# Patient Record
Sex: Female | Born: 1960 | Race: White | Hispanic: No | Marital: Married | Smoking: Never smoker
Health system: Southern US, Community
[De-identification: ages and names within clinical notes are randomized; demographics above are authoritative.]

## PROBLEM LIST (undated history)

## (undated) DIAGNOSIS — C801 Malignant (primary) neoplasm, unspecified: Secondary | ICD-10-CM

## (undated) DIAGNOSIS — Z923 Personal history of irradiation: Secondary | ICD-10-CM

## (undated) DIAGNOSIS — K449 Diaphragmatic hernia without obstruction or gangrene: Secondary | ICD-10-CM

## (undated) DIAGNOSIS — Z86010 Personal history of colonic polyps: Secondary | ICD-10-CM

## (undated) DIAGNOSIS — E785 Hyperlipidemia, unspecified: Secondary | ICD-10-CM

## (undated) DIAGNOSIS — K635 Polyp of colon: Secondary | ICD-10-CM

## (undated) DIAGNOSIS — K219 Gastro-esophageal reflux disease without esophagitis: Secondary | ICD-10-CM

## (undated) DIAGNOSIS — M810 Age-related osteoporosis without current pathological fracture: Secondary | ICD-10-CM

## (undated) DIAGNOSIS — K648 Other hemorrhoids: Secondary | ICD-10-CM

## (undated) DIAGNOSIS — L989 Disorder of the skin and subcutaneous tissue, unspecified: Secondary | ICD-10-CM

## (undated) DIAGNOSIS — K644 Residual hemorrhoidal skin tags: Secondary | ICD-10-CM

## (undated) HISTORY — DX: Gastro-esophageal reflux disease without esophagitis: K21.9

## (undated) HISTORY — PX: COLONOSCOPY: SHX174

## (undated) HISTORY — DX: Residual hemorrhoidal skin tags: K64.4

## (undated) HISTORY — DX: Age-related osteoporosis without current pathological fracture: M81.0

## (undated) HISTORY — DX: Disorder of the skin and subcutaneous tissue, unspecified: L98.9

## (undated) HISTORY — DX: Personal history of irradiation: Z92.3

## (undated) HISTORY — PX: RHINOPLASTY: SUR1284

## (undated) HISTORY — DX: Other hemorrhoids: K64.8

## (undated) HISTORY — DX: Diaphragmatic hernia without obstruction or gangrene: K44.9

## (undated) HISTORY — DX: Hyperlipidemia, unspecified: E78.5

## (undated) HISTORY — DX: Personal history of colonic polyps: Z86.010

## (undated) HISTORY — DX: Polyp of colon: K63.5

---

## 2001-01-17 ENCOUNTER — Other Ambulatory Visit: Admission: RE | Admit: 2001-01-17 | Discharge: 2001-01-17 | Payer: Self-pay | Admitting: *Deleted

## 2004-04-09 ENCOUNTER — Other Ambulatory Visit: Admission: RE | Admit: 2004-04-09 | Discharge: 2004-04-09 | Payer: Self-pay | Admitting: *Deleted

## 2005-09-23 ENCOUNTER — Other Ambulatory Visit: Admission: RE | Admit: 2005-09-23 | Discharge: 2005-09-23 | Payer: Self-pay | Admitting: *Deleted

## 2006-10-20 ENCOUNTER — Other Ambulatory Visit: Admission: RE | Admit: 2006-10-20 | Discharge: 2006-10-20 | Payer: Self-pay | Admitting: *Deleted

## 2007-10-26 ENCOUNTER — Other Ambulatory Visit: Admission: RE | Admit: 2007-10-26 | Discharge: 2007-10-26 | Payer: Self-pay | Admitting: Family Medicine

## 2007-12-06 LAB — CONVERTED CEMR LAB: Pap Smear: NORMAL

## 2008-11-12 ENCOUNTER — Other Ambulatory Visit: Admission: RE | Admit: 2008-11-12 | Discharge: 2008-11-12 | Payer: Self-pay | Admitting: Family Medicine

## 2009-06-15 ENCOUNTER — Encounter: Payer: Self-pay | Admitting: Family Medicine

## 2009-06-15 HISTORY — PX: COLONOSCOPY W/ BIOPSIES: SHX1374

## 2010-07-06 ENCOUNTER — Ambulatory Visit: Payer: Self-pay | Admitting: Family Medicine

## 2010-07-06 DIAGNOSIS — N951 Menopausal and female climacteric states: Secondary | ICD-10-CM

## 2010-07-08 LAB — CONVERTED CEMR LAB
ALT: 18 units/L (ref 0–35)
AST: 20 units/L (ref 0–37)
Albumin: 4.1 g/dL (ref 3.5–5.2)
Alkaline Phosphatase: 77 units/L (ref 39–117)
BUN: 14 mg/dL (ref 6–23)
Basophils Absolute: 0 10*3/uL (ref 0.0–0.1)
Basophils Relative: 0.4 % (ref 0.0–3.0)
Bilirubin, Direct: 0.1 mg/dL (ref 0.0–0.3)
CO2: 29 meq/L (ref 19–32)
Calcium: 10 mg/dL (ref 8.4–10.5)
Chloride: 109 meq/L (ref 96–112)
Cholesterol: 253 mg/dL — ABNORMAL HIGH (ref 0–200)
Creatinine, Ser: 0.8 mg/dL (ref 0.4–1.2)
Direct LDL: 164.2 mg/dL
Eosinophils Absolute: 0.2 10*3/uL (ref 0.0–0.7)
Eosinophils Relative: 1.6 % (ref 0.0–5.0)
GFR calc non Af Amer: 85.09 mL/min (ref >60)
Glucose, Bld: 84 mg/dL (ref 70–99)
HCT: 38.4 % (ref 36.0–46.0)
HDL: 38.4 mg/dL — ABNORMAL LOW (ref >39.00)
Hemoglobin: 13 g/dL (ref 12.0–15.0)
Lymphocytes Relative: 22.9 % (ref 12.0–46.0)
Lymphs Abs: 2.4 10*3/uL (ref 0.7–4.0)
MCHC: 33.8 g/dL (ref 30.0–36.0)
MCV: 85.8 fL (ref 78.0–100.0)
Monocytes Absolute: 0.9 10*3/uL (ref 0.1–1.0)
Monocytes Relative: 8.3 % (ref 3.0–12.0)
Neutro Abs: 7 10*3/uL (ref 1.4–7.7)
Neutrophils Relative %: 66.8 % (ref 43.0–77.0)
Platelets: 340 10*3/uL (ref 150.0–400.0)
Potassium: 4.4 meq/L (ref 3.5–5.1)
RBC: 4.48 M/uL (ref 3.87–5.11)
RDW: 14 % (ref 11.5–14.6)
Sodium: 143 meq/L (ref 135–145)
TSH: 1.95 microintl units/mL (ref 0.35–5.50)
Total Bilirubin: 0.4 mg/dL (ref 0.3–1.2)
Total CHOL/HDL Ratio: 7
Total Protein: 7.2 g/dL (ref 6.0–8.3)
Triglycerides: 243 mg/dL — ABNORMAL HIGH (ref 0.0–149.0)
VLDL: 48.6 mg/dL — ABNORMAL HIGH (ref 0.0–40.0)
WBC: 10.4 10*3/uL (ref 4.5–10.5)

## 2010-07-09 ENCOUNTER — Encounter: Payer: Self-pay | Admitting: Family Medicine

## 2010-07-13 ENCOUNTER — Encounter: Payer: Self-pay | Admitting: Family Medicine

## 2010-09-17 ENCOUNTER — Ambulatory Visit: Payer: Self-pay | Admitting: Family Medicine

## 2010-09-19 LAB — CONVERTED CEMR LAB
ALT: 20 units/L (ref 0–35)
AST: 22 units/L (ref 0–37)
Albumin: 3.9 g/dL (ref 3.5–5.2)
Alkaline Phosphatase: 75 units/L (ref 39–117)
BUN: 13 mg/dL (ref 6–23)
Basophils Absolute: 0.1 10*3/uL (ref 0.0–0.1)
Basophils Relative: 0.6 % (ref 0.0–3.0)
Bilirubin, Direct: 0.1 mg/dL (ref 0.0–0.3)
CO2: 26 meq/L (ref 19–32)
Calcium: 9.6 mg/dL (ref 8.4–10.5)
Chloride: 107 meq/L (ref 96–112)
Cholesterol: 250 mg/dL — ABNORMAL HIGH (ref 0–200)
Creatinine, Ser: 0.8 mg/dL (ref 0.4–1.2)
Direct LDL: 174.9 mg/dL
Eosinophils Absolute: 0.2 10*3/uL (ref 0.0–0.7)
Eosinophils Relative: 1.8 % (ref 0.0–5.0)
GFR calc non Af Amer: 78.99 mL/min (ref >60)
Glucose, Bld: 88 mg/dL (ref 70–99)
HCT: 38.7 % (ref 36.0–46.0)
HDL: 39.2 mg/dL (ref >39.00)
Hemoglobin: 13.1 g/dL (ref 12.0–15.0)
Lymphocytes Relative: 22.6 % (ref 12.0–46.0)
Lymphs Abs: 2.2 10*3/uL (ref 0.7–4.0)
MCHC: 33.9 g/dL (ref 30.0–36.0)
MCV: 87.5 fL (ref 78.0–100.0)
Monocytes Absolute: 0.8 10*3/uL (ref 0.1–1.0)
Monocytes Relative: 8.7 % (ref 3.0–12.0)
Neutro Abs: 6.4 10*3/uL (ref 1.4–7.7)
Neutrophils Relative %: 66.3 % (ref 43.0–77.0)
Platelets: 327 10*3/uL (ref 150.0–400.0)
Potassium: 4.3 meq/L (ref 3.5–5.1)
RBC: 4.43 M/uL (ref 3.87–5.11)
RDW: 13.8 % (ref 11.5–14.6)
Sodium: 140 meq/L (ref 135–145)
TSH: 3.73 microintl units/mL (ref 0.35–5.50)
Total Bilirubin: 0.2 mg/dL — ABNORMAL LOW (ref 0.3–1.2)
Total CHOL/HDL Ratio: 6
Total Protein: 6.9 g/dL (ref 6.0–8.3)
Triglycerides: 199 mg/dL — ABNORMAL HIGH (ref 0.0–149.0)
VLDL: 39.8 mg/dL (ref 0.0–40.0)
WBC: 9.6 10*3/uL (ref 4.5–10.5)

## 2010-09-20 ENCOUNTER — Telehealth (INDEPENDENT_AMBULATORY_CARE_PROVIDER_SITE_OTHER): Payer: Self-pay | Admitting: *Deleted

## 2010-09-24 ENCOUNTER — Other Ambulatory Visit: Admission: RE | Admit: 2010-09-24 | Discharge: 2010-09-24 | Payer: Self-pay | Admitting: Family Medicine

## 2010-09-24 ENCOUNTER — Ambulatory Visit: Payer: Self-pay | Admitting: Family Medicine

## 2010-09-24 DIAGNOSIS — E785 Hyperlipidemia, unspecified: Secondary | ICD-10-CM

## 2010-09-24 LAB — CONVERTED CEMR LAB: Pap Smear: NORMAL

## 2010-09-24 LAB — HM PAP SMEAR

## 2010-10-04 ENCOUNTER — Encounter: Payer: Self-pay | Admitting: Family Medicine

## 2010-10-04 LAB — CONVERTED CEMR LAB: Pap Smear: NEGATIVE

## 2010-12-24 ENCOUNTER — Ambulatory Visit
Admission: RE | Admit: 2010-12-24 | Discharge: 2010-12-24 | Payer: Self-pay | Source: Home / Self Care | Attending: Family Medicine | Admitting: Family Medicine

## 2010-12-24 ENCOUNTER — Other Ambulatory Visit: Payer: Self-pay | Admitting: Family Medicine

## 2010-12-24 LAB — LDL CHOLESTEROL, DIRECT: Direct LDL: 172.4 mg/dL

## 2010-12-24 LAB — LIPID PANEL
Cholesterol: 227 mg/dL — ABNORMAL HIGH (ref 0–200)
HDL: 39.1 mg/dL (ref >39.00)
Total CHOL/HDL Ratio: 6
Triglycerides: 107 mg/dL (ref 0.0–149.0)
VLDL: 21.4 mg/dL (ref 0.0–40.0)

## 2010-12-24 LAB — AST: AST: 17 U/L (ref 0–37)

## 2010-12-24 LAB — ALT: ALT: 14 U/L (ref 0–35)

## 2010-12-31 ENCOUNTER — Ambulatory Visit
Admission: RE | Admit: 2010-12-31 | Discharge: 2010-12-31 | Payer: Self-pay | Source: Home / Self Care | Attending: Family Medicine | Admitting: Family Medicine

## 2010-12-31 DIAGNOSIS — M722 Plantar fascial fibromatosis: Secondary | ICD-10-CM | POA: Insufficient documentation

## 2011-01-04 NOTE — Assessment & Plan Note (Signed)
Summary: cpx gyn exam/rbh   Vital Signs:  Patient profile:   50 year old female Height:      65 inches Weight:      185.75 pounds BMI:     31.02 Temp:     98 degrees F oral Pulse rate:   76 / minute Pulse rhythm:   regular BP sitting:   112 / 82  (left arm) Cuff size:   regular  Vitals Entered By: Lewanda Rife LPN (September 24, 2010 9:51 AM) CC: CPX LMP 05/2008   History of Present Illness: here for health mt exam and to rev chronic med problems  has been feeling fine  nothing new   wt is up 1 lb  bp great 112/82  lipids up with trig 199 and HDL 39 and LDL 174 - up from 160s  diet is fair - goes to fast food but chooses salads and grilled chicken eats wheat bread  some fatty sweets  no beef  occ biscut, no egg , not breakfast meats  not butter  loves shellfish   exercising sporatically- likes to walk  when she gets off track  sit down job     pap nl 09 had abn pap in past -- with ? colp -- was ok    mam nl 8/11 self exam -- no lumps   colonosc due 2020  tdap 09  flu shot- does not get them   Allergies (verified): No Known Drug Allergies  Past History:  Past Medical History: Last updated: 2010-07-23 Migraines borderline high chol  non malignant skin lesions on head            [facility] for colonoscopy derm- Dr Margo Aye   Past Surgical History: Last updated: 23-Jul-2010 rhinoplasty -- for problems breathing (Dr Haroldine Laws)   Family History: Last updated: July 23, 2010 Mother: Deceased - Diabetes- amputation and kidney failure father : died at 45 of heart attack, and bph (catheter)  no other CAD or DM in family   no cancer in family   Social History: Last updated: 2010-07-23 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 Eyes:  Denies blurring and eye irritation. CV:  Denies chest pain or discomfort, lightheadness, palpitations, and shortness of breath with exertion. Resp:  Denies cough, shortness of breath, and wheezing. GI:  Denies abdominal pain, change in bowel habits, indigestion, nausea, and vomiting. GU:  Denies abnormal vaginal bleeding, discharge, dysuria, and urinary frequency. MS:  Denies muscle aches and cramps. Derm:  Denies itching, lesion(s), poor wound healing, and rash. Neuro:  Denies numbness and tingling. Psych:  Denies anxiety and depression. Endo:  Denies excessive thirst and excessive urination. Heme:  Denies abnormal bruising and bleeding.  Physical Exam  General:  overweight but generally well appearing  Head:  normocephalic, atraumatic, and no abnormalities observed.   Eyes:  vision grossly intact, pupils equal, pupils round, and pupils reactive to light.  no conjunctival pallor, injection or icterus  Ears:  R ear normal and L ear normal.   Nose:  no nasal discharge.   Mouth:  pharynx pink and moist.   Neck:  supple with full rom and no masses or thyromegally, no JVD or carotid bruit  Chest Wall:  No deformities, masses, or tenderness  noted. Breasts:  No mass, nodules, thickening, tenderness, bulging, retraction, inflamation, nipple discharge or skin changes noted.   Lungs:  Normal respiratory effort, chest expands symmetrically. Lungs are clear to auscultation, no crackles or wheezes. Heart:  Normal rate and regular rhythm. S1 and S2 normal without gallop, murmur, click, rub or other extra sounds. Abdomen:  Bowel sounds positive,abdomen soft and non-tender without masses, organomegaly or hernias noted. no renal bruits  Genitalia:  Normal introitus for age, no external lesions, no vaginal discharge, mucosa pink and moist, no vaginal or cervical lesions, no vaginal atrophy, no friaility or hemorrhage,  normal uterus size and position, no adnexal masses or tenderness Msk:  No deformity or scoliosis noted of thoracic or lumbar spine.  no acute joint changes Pulses:  R and L carotid,radial,femoral,dorsalis pedis and posterior tibial pulses are full and equal bilaterally Extremities:  No clubbing, cyanosis, edema, or deformity noted with normal full range of motion of all joints.   Neurologic:  sensation intact to light touch, gait normal, and DTRs symmetrical and normal.   Skin:  Intact without suspicious lesions or rashes Cervical Nodes:  No lymphadenopathy noted Axillary Nodes:  No palpable lymphadenopathy Inguinal Nodes:  No significant adenopathy Psych:  normal affect, talkative and pleasant    Impression & Recommendations:  Problem # 1:  HEALTH MAINTENANCE EXAM (ICD-V70.0) Assessment Comment Only reviewed health habits including diet, exercise and skin cancer prevention reviewed health maintenance list and family history rev labs in detail with pt  Problem # 2:  ROUTINE GYNECOLOGICAL EXAMINATION (ICD-V72.31) Assessment: Comment Only annual exam with pap- no problems  Problem # 3:  HYPERLIPIDEMIA (ICD-272.4) Assessment: Deteriorated  with LDL 170s and sub opt diet rev low sat fat diet in detail  lab and f/u 3 mo   Labs Reviewed: SGOT: 22 (09/17/2010)   SGPT: 20 (09/17/2010)   HDL:39.20 (09/17/2010), 38.40 (07/06/2010)  Chol:250 (09/17/2010), 253 (07/06/2010)  Trig:199.0 (09/17/2010), 243.0 (07/06/2010)  Complete Medication List: 1)  Multivitamins Tabs (Multiple vitamin) .... Take 1 tablet by mouth once a day 2)  Fish Oil Oil (Fish oil) .... Take 1200mg  daily 3)  Eq Antacid Extra Strength 750 Mg Chew (Calcium carbonate antacid) .... Two tabs in am and two tabs in pm  Patient Instructions: 1)  you can raise your HDL (good cholesterol) by increasing exercise and eating omega 3 fatty acid supplement like fish oil or flax seed oil over the counter 2)  you can lower LDL (bad  cholesterol) by limiting saturated fats in diet like red meat, fried foods, egg yolks, fatty breakfast meats, high fat dairy products and shellfish 3)  schedule fasting labs in 3 months for lipid/ast/alt 272 and then follow up  4)  try to incorporate regular exercise    Orders Added: 1)  Est. Patient 40-64 years [16109]    Current Allergies (reviewed today): No known allergies

## 2011-01-04 NOTE — Progress Notes (Signed)
----   Converted from flag ---- ---- 09/15/2010 8:28 PM, Judith Part MD wrote: please check wellness and lipid v70.0, 272   ---- 09/15/2010 11:56 AM, Liane Comber CMA (AAMA) wrote: Lab orders please! Good Morning! This pt is scheduled for cpx labs tomorrow, which labs to draw and dx codes to use? Thanks Tasha ------------------------------

## 2011-01-04 NOTE — Miscellaneous (Signed)
Summary: Flowsheet update  Clinical Lists Changes  Observations: Added new observation of MAMMOGRAM: normal (07/13/2010 9:03)

## 2011-01-04 NOTE — Letter (Signed)
Summary: Results Follow up Letter  [facility]  [address] [place], [place] [address]   Phone: [phone]  Fax: [phone]    10/04/2010 MRN: [id number]    [facility] [address] [place], [place]  [address]    Dear Ms. Reinitz,  The following are the results of your recent test(s):  Test         Result    Pap Smear:        Normal __X___  Not Normal _____ Comments: ______________________________________________________ Cholesterol: LDL(Bad cholesterol):         Your goal is less than:         HDL (Good cholesterol):       Your goal is more than: Comments:  ______________________________________________________ Mammogram:        Normal _____  Not Normal _____ Comments:  ___________________________________________________________________ Hemoccult:        Normal _____  Not normal _______ Comments:    _____________________________________________________________________ Other Tests:    We routinely do not discuss normal results over the telephone.  If you desire a copy of the results, or you have any questions about this information we can discuss them at your next office visit.   Sincerely,    Idamae Schuller Tower,MD  MT/ri

## 2011-01-04 NOTE — Letter (Signed)
Summary: Patient Questionnaire  Patient Questionnaire   Imported By: Beau Fanny 07/06/2010 14:26:50  _____________________________________________________________________  External Attachment:    Type:   Image     Comment:   External Document

## 2011-01-04 NOTE — Assessment & Plan Note (Signed)
Summary: NEW PT TO EST/CLE   Vital Signs:  Patient profile:   50 year old female Height:      65 inches Weight:      184.25 pounds BMI:     30.77 Temp:     98.5 degrees F oral Pulse rate:   80 / minute Pulse rhythm:   regular BP sitting:   124 / 84  (left arm) Cuff size:   regular  Vitals Entered By: Lewanda Rife LPN 07-28-10 11:06 AM) CC: New pt to establish   History of Present Illness: used to see Dr Janey Greaser for years- retired then Dr Zachery Dauer and then Dr Hyacinth Meeker at [facility]   is changing due to location and convenience  is pretty healthy overall   is going through menopause  started some symptoms as early as 40 getting worse and worse  gets incredibly hot -in general -- and then hot flashes on top of that some sweats at night- not too severe  headaches are better  tosses and turns at night -- not much sleep  emotions are labile -- not severe  some vaginal dryness  no joint pain  stopped her period at about 43  had heavy bleeding at the end (nl ultrasound)  does not want hormones -- and has not tried black kohash    hx of migraines- not as much as she used to  no all or asthma or heart M or other problems  no HTN possible borderline high chol   has lost some wt and gained it back   had rhinoplasty for breathing   takes antacids for calcium   overdue gyn and mam  ? last Td - thinks within 10 year   last PE she thinks was in 2009   has spot on head to check   Preventive Screening-Counseling & Management  Alcohol-Tobacco     Smoking Status: never  Caffeine-Diet-Exercise     Does Patient Exercise: no      Drug Use:  no.    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  Never Smoked Alcohol use-no Drug use-no Regular exercise-no (used to walk on treadmill)   Risk Factors: Exercise: no (28-Jul-2010)  Risk Factors: Smoking Status: never (Jul 28, 2010)  Past Medical History: Migraines borderline high chol  non malignant skin lesions on head            [facility] for colonoscopy derm- Dr Margo Aye   Past Surgical History: rhinoplasty -- for problems breathing (Dr Haroldine Laws)   Family History: Mother: Deceased - Diabetes- amputation and kidney failure father : died at 70 of heart attack, and bph (catheter)  no other CAD or DM in family   no cancer in family   Social History: 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 Eyes:  Denies blurring and eye irritation. CV:  Denies chest pain or discomfort, lightheadness, and palpitations.  Resp:  Denies cough, shortness of breath, and wheezing. GI:  Denies abdominal pain, change in bowel habits, indigestion, and nausea. GU:  Denies abnormal vaginal bleeding, discharge, and dysuria. MS:  Denies joint pain, joint redness, and joint swelling. Derm:  Denies itching, lesion(s), poor wound healing, and rash. Neuro:  Denies numbness, tingling, and weakness. Psych:  Denies anxiety and depression. Endo:  Complains of heat intolerance; denies excessive thirst and excessive urination. Heme:  Denies abnormal bruising and bleeding.  Physical Exam  General:  overweight but generally well appearing  Head:  normocephalic, atraumatic, and no abnormalities observed.   Eyes:  vision grossly intact, pupils equal, pupils round, and pupils reactive to light.  no  conjunctival pallor, injection or icterus  Ears:  R ear normal and L ear normal.   Nose:  no nasal discharge.   Mouth:  pharynx pink and moist.   Neck:  supple with full rom and no masses or thyromegally, no JVD or carotid bruit  Chest Wall:  No deformities, masses, or tenderness noted. Lungs:  Normal respiratory effort, chest expands symmetrically. Lungs are clear to auscultation, no crackles or wheezes. Heart:  Normal rate and regular rhythm. S1 and S2 normal without gallop, murmur, click, rub or other extra sounds. Abdomen:  Bowel sounds positive,abdomen soft and non-tender without masses, organomegaly or hernias noted. no renal bruits  Msk:  No deformity or scoliosis noted of thoracic or lumbar spine.  no acute joint changes  Pulses:  R and L carotid,radial,femoral,dorsalis pedis and posterior tibial pulses are full and equal bilaterally Extremities:  No clubbing, cyanosis, edema, or deformity noted with normal full range of motion of all joints.   Neurologic:  sensation intact to light touch, gait normal, and DTRs symmetrical and normal.   Skin:  L upper scalp 4 mm flesh colored smooth - translucent looking lesion Cervical Nodes:  No lymphadenopathy noted Inguinal Nodes:  No significant adenopathy Psych:  normal affect, talkative and pleasant    Impression & Recommendations:  Problem # 1:  MENOPAUSAL SYNDROME (ICD-627.2) Assessment New disc this in detail may try estroven/ black kohash  overall tolerating well   Problem # 2:  Preventive Health Care (ICD-V70.0) Assessment: Comment Only lab today - will f/u for PE   Problem # 3:  NEOPLASM OF UNCERTAIN BEHAVIOR OF SKIN (ICD-238.2) Assessment: New mole on L scalp that is skin colored and slt translucent pt will f/u derm- ? poss need to r/o basal cell lesion  Complete Medication List: 1)  Multivitamins Tabs (Multiple vitamin) .... Take 1 tablet by mouth once a day 2)  Fish Oil Oil (Fish oil) .... Take 1200mg  daily 3)  Eq  Antacid Extra Strength 750 Mg Chew (Calcium carbonate antacid) .... Two tabs in am and two tabs in pm  Other Orders: Venipuncture ([address]109) TLB-Lipid Panel (80061-LIPID) TLB-BMP (Basic Metabolic Panel-BMET) (80048-METABOL) TLB-CBC Platelet - w/Differential (85025-CBCD) TLB-Hepatic/Liver Function Pnl (80076-HEPATIC) TLB-TSH (Thyroid Stimulating Hormone) (60454-UJW) Radiology Referral (Radiology)  Patient Instructions: 1)  please send for some records from [facility] -- immunizations, colonoscopy report, last labs , last note  2)  we will schedule mammogram at check out  3)  see Dr Margo Aye for your mole on scalp 4)  labs today for wellness 5)  schedule Physical and gyn exam for the fall   Current Allergies (reviewed today): No known allergies    Preventive Care Screening  Colonoscopy:    Date:  06/04/2009    Next Due:  06/2019    Results:  Hyperplastic Polyp   Mammogram:    Date:  11/21/2008    Results:  normal   Last Tetanus Booster:    Date:  11/12/2008    Results:  Tdap   Pap Smear:    Date:  12/06/2007    Results:  normal

## 2011-01-04 NOTE — Letter (Signed)
Summary: [facility] Physicians Records  [facility] Records   Imported By: Beau Fanny 07/20/2010 10:37:50  _____________________________________________________________________  External Attachment:    Type:   Image     Comment:   External Document

## 2011-01-06 NOTE — Assessment & Plan Note (Signed)
Summary: 3 MONTH FOLLOW UP/RBH   Vital Signs:  Patient profile:   50 year old female Height:      65 inches Weight:      183.75 pounds BMI:     30.69 Temp:     98.2 degrees F oral Pulse rate:   72 / minute Pulse rhythm:   regular BP sitting:   126 / 82  (left arm) Cuff size:   regular  Vitals Entered By: Lewanda Rife LPN (December 31, 2010 8:20 AM) CC: three month f/u after labs   History of Present Illness: here for lipids  no changes   LDL still in 170s - dissapointing   has plantar fasciitis -- sees Dr Al Corpus  did shot in foot and wrapped her   HDL 39   so has not exercised  she did make some changes in her diet  no fried foods  eating more salads  avoiding bread no red meat  no egg yolks  no sausage or bacon  drinks skim milk and ice cream  stopped shellfish   thinks is open to med     Allergies (verified): No Known Drug Allergies  Past History:  Past Medical History: Last updated: 07-30-2010 Migraines borderline high chol  non malignant skin lesions on head            [facility] for colonoscopy derm- Dr Margo Aye   Past Surgical History: Last updated: 07-30-2010 rhinoplasty -- for problems breathing (Dr Haroldine Laws)   Family History: Last updated: 2010/07/30 Mother: Deceased - Diabetes- amputation and kidney failure father : died at 30 of heart attack, and bph (catheter)  no other CAD or DM in family   no cancer in family   Social History: Last updated: 07-30-10 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 Eyes:  Denies blurring. CV:  Denies chest pain or discomfort. Resp:  Denies cough and shortness of breath. GI:  Denies indigestion. MS:  Denies muscle  aches and cramps. Derm:  Denies rash. Endo:  Denies excessive thirst and excessive urination. Heme:  Denies abnormal bruising and bleeding.  Physical Exam  General:  overweight but generally well appearing  Head:  normocephalic, atraumatic, and no abnormalities observed.   Eyes:  vision grossly intact, pupils equal, pupils round, and pupils reactive to light.  no conjunctival pallor, injection or icterus  Mouth:  pharynx pink and moist.   Neck:  supple with full rom and no masses or thyromegally, no JVD or carotid bruit  Chest Wall:  No deformities, masses, or tenderness noted. Lungs:  Normal respiratory effort, chest expands symmetrically. Lungs are clear to auscultation, no crackles or wheezes. Heart:  Normal rate and regular rhythm. S1 and S2 normal without gallop, murmur, click, rub or other extra sounds. Msk:  No deformity or scoliosis noted of thoracic or lumbar spine.  no acute joint changes Pulses:  R and L carotid,radial,femoral,dorsalis pedis and posterior tibial pulses are full and equal bilaterally Extremities:  No clubbing, cyanosis, edema, or deformity noted with normal full range of motion of all joints.   Neurologic:  sensation intact to light touch, gait normal, and DTRs symmetrical and normal.   Cervical Nodes:  No lymphadenopathy noted Psych:  pt very tearful about poss of needing chol  med    Impression & Recommendations:  Problem # 1:  HYPERLIPIDEMIA (ICD-272.4) Assessment Unchanged  with very good diet - her LDL is in 170s disc nature of genetic high chol I feel she needs statin  pt was upset and tearful about this -- but could not exactly tell me why we disc poss of side eff given px for lipitor 10 and told to stop if and call if side eff continue diet if she takes med will check lab in 6 wk and titrate if needd  Her updated medication list for this problem includes:    Lipitor 10 Mg Tabs (Atorvastatin calcium) .Marland Kitchen... 1 by mouth once daily  Labs  Reviewed: SGOT: 17 (12/24/2010)   SGPT: 14 (12/24/2010)   HDL:39.10 (12/24/2010), 39.20 (09/17/2010)  Chol:227 (12/24/2010), 250 (09/17/2010)  Trig:107.0 (12/24/2010), 199.0 (09/17/2010)  Problem # 2:  PLANTAR FASCIITIS (ICD-728.71) Assessment: Comment Only slowly imp after inj and with mobic  will continue f/u Dr Al Corpus Her updated medication list for this problem includes:    Meloxicam 15 Mg Tabs (Meloxicam) .Marland Kitchen... Take 1 tablet by mouth once a day  Complete Medication List: 1)  Multivitamins Tabs (Multiple vitamin) .... Take 1 tablet by mouth once a day 2)  Fish Oil Oil (Fish oil) .... Take 1200mg  daily 3)  Eq Antacid Extra Strength 750 Mg Chew (Calcium carbonate antacid) .... Two tabs in am and two tabs in pm 4)  Prednisone (pak) 5 Mg Tabs (Prednisone) .... As directed 5)  Meloxicam 15 Mg Tabs (Meloxicam) .... Take 1 tablet by mouth once a day 6)  Lipitor 10 Mg Tabs (Atorvastatin calcium) .Marland Kitchen.. 1 by mouth once daily  Patient Instructions: 1)  think about low impact exercise - like swimming or bike  2)  keep up the great low cholesterol diet  3)  if you are open to cholesterol medicine - I would choose generic lipitor 10 mg  4)  here is px 5)  if you start it we must do labs in 6 weeks - so call to schedule that  6)  for cholesterol  7)  if any side effects at all - stop it and let me know  Prescriptions: LIPITOR 10 MG TABS (ATORVASTATIN CALCIUM) 1 by mouth once daily  #30 x 11   Entered and Authorized by:   Judith Part MD   Signed by:   Judith Part MD on 12/31/2010   Method used:   Print then Give to Patient   RxID:   [id number]    Orders Added: 1)  Est. Patient Level III [95621]    Current Allergies (reviewed today): No known allergies

## 2011-02-11 ENCOUNTER — Other Ambulatory Visit (INDEPENDENT_AMBULATORY_CARE_PROVIDER_SITE_OTHER): Payer: 59

## 2011-02-11 ENCOUNTER — Encounter (INDEPENDENT_AMBULATORY_CARE_PROVIDER_SITE_OTHER): Payer: Self-pay | Admitting: *Deleted

## 2011-02-11 ENCOUNTER — Other Ambulatory Visit: Payer: Self-pay | Admitting: Family Medicine

## 2011-02-11 DIAGNOSIS — E785 Hyperlipidemia, unspecified: Secondary | ICD-10-CM

## 2011-02-11 LAB — AST: AST: 19 U/L (ref 0–37)

## 2011-02-11 LAB — LIPID PANEL
HDL: 39.9 mg/dL (ref >39.00)
Triglycerides: 115 mg/dL (ref 0.0–149.0)
VLDL: 23 mg/dL (ref 0.0–40.0)

## 2011-03-22 ENCOUNTER — Other Ambulatory Visit: Payer: Self-pay | Admitting: *Deleted

## 2011-03-22 MED ORDER — ATORVASTATIN CALCIUM 10 MG PO TABS: 10.0000 mg | ORAL_TABLET | Freq: Every day | ORAL | Status: DC

## 2011-07-28 ENCOUNTER — Telehealth: Payer: Self-pay | Admitting: *Deleted

## 2011-07-28 NOTE — Telephone Encounter (Signed)
Advised pt that she had Tdap 11/12/08 and is up to date with the vaccine.

## 2011-10-20 ENCOUNTER — Telehealth: Payer: Self-pay | Admitting: Family Medicine

## 2011-10-20 DIAGNOSIS — E785 Hyperlipidemia, unspecified: Secondary | ICD-10-CM

## 2011-10-20 DIAGNOSIS — Z0000 Encounter for general adult medical examination without abnormal findings: Secondary | ICD-10-CM | POA: Insufficient documentation

## 2011-10-20 NOTE — Telephone Encounter (Signed)
Message copied by Judy Pimple on Thu Oct 20, 2011  7:06 PM ------      Message from: Alvina Chou      Created: Thu Oct 13, 2011 10:50 AM      Regarding: Labs for Fri 11-16       Patient is scheduled for CPX labs, please order future labs, Thanks , Camelia Eng

## 2011-10-21 ENCOUNTER — Other Ambulatory Visit (INDEPENDENT_AMBULATORY_CARE_PROVIDER_SITE_OTHER): Payer: 59

## 2011-10-21 DIAGNOSIS — Z0000 Encounter for general adult medical examination without abnormal findings: Secondary | ICD-10-CM

## 2011-10-21 DIAGNOSIS — E785 Hyperlipidemia, unspecified: Secondary | ICD-10-CM

## 2011-10-21 LAB — COMPREHENSIVE METABOLIC PANEL
ALT: 17 U/L (ref 0–35)
Alkaline Phosphatase: 73 U/L (ref 39–117)
Creatinine, Ser: 0.8 mg/dL (ref 0.4–1.2)
GFR: 75.43 mL/min (ref >60.00)
Glucose, Bld: 75 mg/dL (ref 70–99)
Sodium: 141 mEq/L (ref 135–145)
Total Bilirubin: 0.5 mg/dL (ref 0.3–1.2)
Total Protein: 7.3 g/dL (ref 6.0–8.3)

## 2011-10-21 LAB — LIPID PANEL
Cholesterol: 158 mg/dL (ref 0–200)
HDL: 42.8 mg/dL (ref >39.00)
LDL Cholesterol: 97 mg/dL (ref 0–99)
VLDL: 18.2 mg/dL (ref 0.0–40.0)

## 2011-10-21 LAB — CBC WITH DIFFERENTIAL/PLATELET
Basophils Absolute: 0 10*3/uL (ref 0.0–0.1)
HCT: 39 % (ref 36.0–46.0)
Hemoglobin: 13 g/dL (ref 12.0–15.0)
Lymphs Abs: 1.9 10*3/uL (ref 0.7–4.0)
MCV: 86.9 fl (ref 78.0–100.0)
Monocytes Absolute: 0.6 10*3/uL (ref 0.1–1.0)
Neutro Abs: 5.7 10*3/uL (ref 1.4–7.7)
Platelets: 306 10*3/uL (ref 150.0–400.0)
RDW: 14.1 % (ref 11.5–14.6)

## 2011-10-25 ENCOUNTER — Encounter: Payer: 59 | Admitting: Family Medicine

## 2011-11-03 ENCOUNTER — Encounter: Payer: Self-pay | Admitting: Family Medicine

## 2011-11-04 ENCOUNTER — Ambulatory Visit (INDEPENDENT_AMBULATORY_CARE_PROVIDER_SITE_OTHER): Payer: 59 | Admitting: Family Medicine

## 2011-11-04 ENCOUNTER — Encounter: Payer: Self-pay | Admitting: Family Medicine

## 2011-11-04 VITALS — BP 114/68 | HR 80 | Temp 98.1°F | Ht 64.75 in | Wt 186.2 lb

## 2011-11-04 DIAGNOSIS — E785 Hyperlipidemia, unspecified: Secondary | ICD-10-CM

## 2011-11-04 DIAGNOSIS — Z1231 Encounter for screening mammogram for malignant neoplasm of breast: Secondary | ICD-10-CM | POA: Insufficient documentation

## 2011-11-04 DIAGNOSIS — E669 Obesity, unspecified: Secondary | ICD-10-CM | POA: Insufficient documentation

## 2011-11-04 DIAGNOSIS — Z23 Encounter for immunization: Secondary | ICD-10-CM

## 2011-11-04 DIAGNOSIS — Z0000 Encounter for general adult medical examination without abnormal findings: Secondary | ICD-10-CM

## 2011-11-04 MED ORDER — ATORVASTATIN CALCIUM 10 MG PO TABS: 10.0000 mg | ORAL_TABLET | Freq: Every day | ORAL | Status: DC

## 2011-11-04 NOTE — Assessment & Plan Note (Signed)
Scheduled annual screening mammogram Nl breast exam today  Encouraged monthly self exams   

## 2011-11-04 NOTE — Assessment & Plan Note (Signed)
Reviewed health habits including diet and exercise and skin cancer prevention Also reviewed health mt list, fam hx and immunizations   Flu shot today Rev wellness labs in detail

## 2011-11-04 NOTE — Assessment & Plan Note (Signed)
Rev plan for 5 d per wk exercise and 1200 cal day diet with more fruit/ veg  Disc challenges of menopause with wt

## 2011-11-04 NOTE — Patient Instructions (Addendum)
Flu shot today  We will schedule mammogram at check out  Aim for exercise 5 days per week - work up to 30 minutes per day For weight loss- try for 1200 calories a day or join weight watchers Avoid red meat/ fried foods/ egg yolks/ fatty breakfast meats/ butter, cheese and high fat dairy/ and shellfish

## 2011-11-04 NOTE — Progress Notes (Signed)
Subjective:    Patient ID: Jane Ruiz, female    DOB: 25-Nov-1961, 50 y.o.   MRN: [id number]  HPI Here for annual health mt exam and to rev chronic problems  Has been feeling ok - nothing new going on medically  bp 114/68 Wt is up 3 lb with high bmi of 31 Diet not optimal -- blames it on menopause symptoms/ which are tough Is trying to buy sugarless products  Exercise-- none at all  Has a treadmill   Lipids good on lipitor and diet No side effects or problems from it  LDLs do not match up well  Lab Results  Component Value Date   CHOL 158 10/21/2011   CHOL 166 02/11/2011   CHOL 227* 12/24/2010   Lab Results  Component Value Date   HDL 42.80 10/21/2011   HDL 39.90 02/11/2011   HDL 39.10 12/24/2010   Lab Results  Component Value Date   LDLCALC 97 10/21/2011   LDLCALC 103* 02/11/2011   Lab Results  Component Value Date   TRIG 91.0 10/21/2011   TRIG 115.0 02/11/2011   TRIG 107.0 12/24/2010   Lab Results  Component Value Date   CHOLHDL 4 10/21/2011   CHOLHDL 4 02/11/2011   CHOLHDL 6 12/24/2010   Lab Results  Component Value Date   LDLDIRECT 172.4 12/24/2010   LDLDIRECT 174.9 09/17/2010   LDLDIRECT 164.2 07/06/2010     mammo 8/11-- needs her yearly mammo Self exam - no lumps or problems   Pap 10/11 nl  One abn pap in past - 20 years (all normal since then)  No gyn problems , and no new partners  No libido  Menopausal   Flu shot- will get today Td 09  colonosc 7/10 with hyperplastic polyp  Patient Active Problem List  Diagnoses  . HYPERLIPIDEMIA  . MENOPAUSAL SYNDROME  . PLANTAR FASCIITIS  . Routine general medical examination at a health care facility  . Other screening mammogram  . Obesity   Past Medical History  Diagnosis Date  . Migraine   . Hyperlipidemia     borderline high cholesterol  . Skin lesion     non maligant skin lesions on head   Past Surgical History  Procedure Date  . Rhinoplasty     breathing problems- Dr Haroldine Laws   History    Substance Use Topics  . Smoking status: Never Smoker   . Smokeless tobacco: Not on file  . Alcohol Use: No   Family History  Problem Relation Age of Onset  . Diabetes Mother   . Kidney disease Mother   . Benign prostatic hyperplasia Father    No Known Allergies Current Outpatient Prescriptions on File Prior to Visit  Medication Sig Dispense Refill  . calcium carbonate (ANTACID EXTRA STRENGTH) 750 MG chewable tablet Chew 2 tablets by mouth 2 (two) times daily.        . Multiple Vitamin (MULTIVITAMIN) tablet Take 1 tablet by mouth daily.        . Omega-3 Fatty Acids (FISH OIL) 1200 MG CAPS Take 1 capsule by mouth daily.        . meloxicam (MOBIC) 15 MG tablet Take 15 mg by mouth daily.            Review of Systems Review of Systems  Constitutional: Negative for fever, appetite change, fatigue and unexpected weight change.  Eyes: Negative for pain and visual disturbance.  Respiratory: Negative for cough and shortness of breath.   Cardiovascular: Negative for cp  or palpitations    Gastrointestinal: Negative for nausea, diarrhea and constipation.  Genitourinary: Negative for urgency and frequency. pos for hot flashes/ menopausal symptoms Skin: Negative for pallor or rash   Neurological: Negative for weakness, light-headedness, numbness and headaches.  Hematological: Negative for adenopathy. Does not bruise/bleed easily.  Psychiatric/Behavioral: Negative for dysphoric mood. The patient is not nervous/anxious.          Objective:   Physical Exam  Constitutional: She appears well-developed and well-nourished. No distress.       overwt and well appearing   HENT:  Head: Normocephalic and atraumatic.  Right Ear: External ear normal.  Left Ear: External ear normal.  Nose: Nose normal.  Mouth/Throat: Oropharynx is clear and moist.  Eyes: Conjunctivae and EOM are normal. Pupils are equal, round, and reactive to light. No scleral icterus.  Neck: Normal range of motion. Neck supple.  No JVD present. Carotid bruit is not present. No thyromegaly present.  Cardiovascular: Normal rate, regular rhythm, normal heart sounds and intact distal pulses.  Exam reveals no gallop.   Pulmonary/Chest: Effort normal and breath sounds normal. No respiratory distress. She has no wheezes. She exhibits no tenderness.  Abdominal: Soft. Bowel sounds are normal. She exhibits no distension, no abdominal bruit and no mass. There is no tenderness.  Genitourinary: No breast swelling, tenderness, discharge or bleeding.       No breast masses noted   Musculoskeletal: Normal range of motion. She exhibits no edema and no tenderness.  Lymphadenopathy:    She has no cervical adenopathy.  Neurological: She is alert. She has normal reflexes. No cranial nerve deficit. She exhibits normal muscle tone. Coordination normal.  Skin: Skin is warm and dry. No rash noted. No erythema. No pallor.  Psychiatric: She has a normal mood and affect.          Assessment & Plan:

## 2011-11-04 NOTE — Assessment & Plan Note (Signed)
Disc goals for lipids and reasons to control them Rev labs with pt Rev low sat fat diet in detail  Will continue lipitor

## 2011-11-11 ENCOUNTER — Encounter: Payer: Self-pay | Admitting: Family Medicine

## 2011-11-16 ENCOUNTER — Encounter: Payer: Self-pay | Admitting: *Deleted

## 2012-11-20 ENCOUNTER — Encounter: Payer: Self-pay | Admitting: Family Medicine

## 2012-11-21 ENCOUNTER — Encounter: Payer: Self-pay | Admitting: *Deleted

## 2012-11-22 ENCOUNTER — Telehealth: Payer: Self-pay | Admitting: Family Medicine

## 2012-11-22 DIAGNOSIS — Z0000 Encounter for general adult medical examination without abnormal findings: Secondary | ICD-10-CM

## 2012-11-22 DIAGNOSIS — E785 Hyperlipidemia, unspecified: Secondary | ICD-10-CM

## 2012-11-22 NOTE — Telephone Encounter (Signed)
Message copied by Judy Pimple on Thu Nov 22, 2012 10:24 PM ------      Message from: Jane Ruiz      Created: Wed Nov 14, 2012  1:50 PM      Regarding: Cpx labs Fri 12/20       Please order  future cpx labs for pt's upcoming lab appt.      Thanks      Rodney Booze

## 2012-11-23 ENCOUNTER — Other Ambulatory Visit (INDEPENDENT_AMBULATORY_CARE_PROVIDER_SITE_OTHER): Payer: 59

## 2012-11-23 DIAGNOSIS — E785 Hyperlipidemia, unspecified: Secondary | ICD-10-CM

## 2012-11-23 DIAGNOSIS — Z0000 Encounter for general adult medical examination without abnormal findings: Secondary | ICD-10-CM

## 2012-11-23 LAB — LIPID PANEL
Cholesterol: 152 mg/dL (ref 0–200)
HDL: 34.3 mg/dL — ABNORMAL LOW (ref >39.00)
Triglycerides: 125 mg/dL (ref 0.0–149.0)

## 2012-11-23 LAB — COMPREHENSIVE METABOLIC PANEL
BUN: 13 mg/dL (ref 6–23)
CO2: 27 mEq/L (ref 19–32)
Calcium: 9.4 mg/dL (ref 8.4–10.5)
Chloride: 106 mEq/L (ref 96–112)
Creatinine, Ser: 0.9 mg/dL (ref 0.4–1.2)
GFR: 74.1 mL/min (ref >60.00)
Glucose, Bld: 87 mg/dL (ref 70–99)
Total Bilirubin: 0.5 mg/dL (ref 0.3–1.2)

## 2012-11-23 LAB — CBC WITH DIFFERENTIAL/PLATELET
Basophils Relative: 0.5 % (ref 0.0–3.0)
HCT: 39.1 % (ref 36.0–46.0)
Hemoglobin: 12.9 g/dL (ref 12.0–15.0)
Lymphocytes Relative: 20.2 % (ref 12.0–46.0)
Lymphs Abs: 1.9 10*3/uL (ref 0.7–4.0)
Monocytes Relative: 7.2 % (ref 3.0–12.0)
Neutro Abs: 6.7 10*3/uL (ref 1.4–7.7)
RBC: 4.57 Mil/uL (ref 3.87–5.11)

## 2012-11-23 LAB — TSH: TSH: 2.16 u[IU]/mL (ref 0.35–5.50)

## 2012-11-26 ENCOUNTER — Encounter: Payer: Self-pay | Admitting: Family Medicine

## 2012-11-26 ENCOUNTER — Ambulatory Visit (INDEPENDENT_AMBULATORY_CARE_PROVIDER_SITE_OTHER): Payer: 59 | Admitting: Family Medicine

## 2012-11-26 VITALS — BP 116/78 | HR 69 | Temp 97.3°F | Ht 64.75 in | Wt 181.5 lb

## 2012-11-26 DIAGNOSIS — J069 Acute upper respiratory infection, unspecified: Secondary | ICD-10-CM | POA: Insufficient documentation

## 2012-11-26 DIAGNOSIS — R05 Cough: Secondary | ICD-10-CM

## 2012-11-26 NOTE — Assessment & Plan Note (Signed)
With neg rapid flu test today Disc symptomatic care - see instructions on AVS  Update if not starting to improve in a week or if worsening   Did inform pt that she is contagious and to avoid immunocomp relatives during the holiday

## 2012-11-26 NOTE — Progress Notes (Signed)
Subjective:    Patient ID: Jane Ruiz, female    DOB: 08-01-1961, 51 y.o.   MRN: [id number]  HPI Here for acute illness  Bad cough that is deep - a little mucous (not a lot)- so far clear  At first heard a rattle  No wheeze or sob   Throat is not sore yet  Some nasal congestion - all clear   Has had some body aches  No chills or sweats   Has been taking day quil and ny quil  Has already had some day quil today   Tired out   Was supposed to have a physical today- will put off/ reschedule since she is sick  Patient Active Problem List  Diagnosis  . HYPERLIPIDEMIA  . MENOPAUSAL SYNDROME  . PLANTAR FASCIITIS  . Routine general medical examination at a health care facility  . Other screening mammogram  . Obesity   Past Medical History  Diagnosis Date  . Migraine   . Hyperlipidemia     borderline high cholesterol  . Skin lesion     non maligant skin lesions on head   Past Surgical History  Procedure Date  . Rhinoplasty     breathing problems- Dr Haroldine Laws   History  Substance Use Topics  . Smoking status: Never Smoker   . Smokeless tobacco: Not on file  . Alcohol Use: No   Family History  Problem Relation Age of Onset  . Diabetes Mother   . Kidney disease Mother   . Benign prostatic hyperplasia Father    No Known Allergies Current Outpatient Prescriptions on File Prior to Visit  Medication Sig Dispense Refill  . atorvastatin (LIPITOR) 10 MG tablet Take 1 tablet (10 mg total) by mouth daily.  90 tablet  3  . calcium carbonate (ANTACID EXTRA STRENGTH) 750 MG chewable tablet Chew 2 tablets by mouth 2 (two) times daily.        . meloxicam (MOBIC) 15 MG tablet Take 15 mg by mouth daily.        . Multiple Vitamin (MULTIVITAMIN) tablet Take 1 tablet by mouth daily.        . Omega-3 Fatty Acids (FISH OIL) 1200 MG CAPS Take 1 capsule by mouth daily.            Review of Systems Review of Systems  Constitutional: pos for body aches/ fatigue/ malaise  Eyes:  Negative for pain and visual disturbance. neg for redness or drainage Respiratory: Negative for wheeze  and shortness of breath.   Cardiovascular: Negative for cp or palpitations    Gastrointestinal: Negative for nausea, diarrhea and constipation.  Genitourinary: Negative for urgency and frequency.  Skin: Negative for pallor or rash   Neurological: Negative for weakness, light-headedness, numbness and headaches.  Hematological: Negative for adenopathy. Does not bruise/bleed easily.  Psychiatric/Behavioral: Negative for dysphoric mood. The patient is not nervous/anxious.         Objective:   Physical Exam  Constitutional: She appears well-developed and well-nourished. No distress.       Fatigued appearing   HENT:  Head: Normocephalic and atraumatic.  Right Ear: External ear normal.  Left Ear: External ear normal.  Mouth/Throat: Oropharynx is clear and moist. No oropharyngeal exudate.       Nares are injected and congested  No sinus tenderness Throat-clear post nasal drainage  Eyes: Conjunctivae normal and EOM are normal. Pupils are equal, round, and reactive to light. Right eye exhibits no discharge. Left eye exhibits no discharge. No scleral  icterus.  Neck: Normal range of motion. Neck supple. No thyromegaly present.  Cardiovascular: Normal rate and regular rhythm.   Pulmonary/Chest: Effort normal and breath sounds normal. No respiratory distress. She has no wheezes. She has no rales.       Harsh bs at bases  Musculoskeletal: She exhibits no edema.  Lymphadenopathy:    She has no cervical adenopathy.  Neurological: She is alert. No cranial nerve deficit.  Skin: Skin is warm and dry. No rash noted. No pallor.  Psychiatric: She has a normal mood and affect.          Assessment & Plan:

## 2012-11-26 NOTE — Patient Instructions (Addendum)
You have a viral upper respiratory infection that will have to run its course Flu test is negative today You are contagious  l recommend lots of fluids and extra rest  Tylenol for fever or body aches and sore throat  mucinex DM for cough Zyrtec for runny nose and post nasal drip  Please re schedule PE when feeling better for any 30 minute appt

## 2012-11-27 ENCOUNTER — Encounter: Payer: 59 | Admitting: Family Medicine

## 2012-12-07 ENCOUNTER — Other Ambulatory Visit: Payer: 59

## 2012-12-14 ENCOUNTER — Encounter: Payer: 59 | Admitting: Family Medicine

## 2012-12-25 ENCOUNTER — Ambulatory Visit (INDEPENDENT_AMBULATORY_CARE_PROVIDER_SITE_OTHER): Payer: 59 | Admitting: Family Medicine

## 2012-12-25 ENCOUNTER — Encounter: Payer: Self-pay | Admitting: Family Medicine

## 2012-12-25 VITALS — BP 108/66 | HR 72 | Temp 98.2°F | Ht 65.0 in | Wt 181.5 lb

## 2012-12-25 DIAGNOSIS — Z1231 Encounter for screening mammogram for malignant neoplasm of breast: Secondary | ICD-10-CM

## 2012-12-25 DIAGNOSIS — Z0000 Encounter for general adult medical examination without abnormal findings: Secondary | ICD-10-CM

## 2012-12-25 DIAGNOSIS — E785 Hyperlipidemia, unspecified: Secondary | ICD-10-CM

## 2012-12-25 DIAGNOSIS — N920 Excessive and frequent menstruation with regular cycle: Secondary | ICD-10-CM

## 2012-12-25 MED ORDER — ATORVASTATIN CALCIUM 10 MG PO TABS: 10.0000 mg | ORAL_TABLET | Freq: Every day | ORAL | Status: DC

## 2012-12-25 NOTE — Assessment & Plan Note (Signed)
Disc goals for lipids and reasons to control them Rev labs with pt Rev low sat fat diet in detail Will exercise to inc HDL Continue lipitor

## 2012-12-25 NOTE — Assessment & Plan Note (Signed)
Up to date Exam done  Mole on R breast appears to be SK

## 2012-12-25 NOTE — Assessment & Plan Note (Signed)
Ref to gyn for eval- perimenopausal and menses for over 1 mo

## 2012-12-25 NOTE — Progress Notes (Signed)
Subjective:    Patient ID: Jane Ruiz, female    DOB: [birth year], 52 y.o.   MRN: [id number]  HPI Here for health maintenance exam and to review chronic medical problems    Is doing ok overall  No new medical problems   Wt is stable - would love to lose some  bmi 30 Obese Diet- not optimal - hard for her to get started  Exercise- schedule is an issue   Pap 10/11 Has been on menses since xmas - it is not stopping - very light and continues to spot and having cramps  Also having hot flashes   mammo 12/13 Self exam -no lumps or changes  Does have a mole on her breast - relatively new - or poss getting bigger (she does have appt with derm for a full body check in march)   colonosc 7/10- 10 year recall   Hyperlipidemia - diet and statin Lab Results  Component Value Date   CHOL 152 11/23/2012   HDL 34.30* 11/23/2012   LDLCALC 93 11/23/2012   LDLDIRECT 172.4 12/24/2010   TRIG 125.0 11/23/2012   CHOLHDL 4 11/23/2012   needs to exercise more    Chemistry      Component Value Date/Time   NA 139 11/23/2012 0844   K 4.1 11/23/2012 0844   CL 106 11/23/2012 0844   CO2 27 11/23/2012 0844   BUN 13 11/23/2012 0844   CREATININE 0.9 11/23/2012 0844      Component Value Date/Time   CALCIUM 9.4 11/23/2012 0844   ALKPHOS 72 11/23/2012 0844   AST 18 11/23/2012 0844   ALT 15 11/23/2012 0844   BILITOT 0.5 11/23/2012 0844      Lab Results  Component Value Date   WBC 9.4 11/23/2012   HGB 12.9 11/23/2012   HCT 39.1 11/23/2012   MCV 85.6 11/23/2012   PLT 339.0 11/23/2012    Lab Results  Component Value Date   TSH 2.16 11/23/2012   Nl glucose of 87  Overall good labs   Patient Active Problem List  Diagnosis  . HYPERLIPIDEMIA  . MENOPAUSAL SYNDROME  . PLANTAR FASCIITIS  . Routine general medical examination at a health care facility  . Other screening mammogram  . Obesity   Past Medical History  Diagnosis Date  . Migraine   . Hyperlipidemia     borderline high  cholesterol  . Skin lesion     non maligant skin lesions on head   Past Surgical History  Procedure Date  . Rhinoplasty     breathing problems- Dr Haroldine Laws   History  Substance Use Topics  . Smoking status: Never Smoker   . Smokeless tobacco: Not on file  . Alcohol Use: No   Family History  Problem Relation Age of Onset  . Diabetes Mother   . Kidney disease Mother   . Benign prostatic hyperplasia Father    No Known Allergies Current Outpatient Prescriptions on File Prior to Visit  Medication Sig Dispense Refill  . atorvastatin (LIPITOR) 10 MG tablet Take 1 tablet (10 mg total) by mouth daily.  90 tablet  3  . calcium carbonate (ANTACID EXTRA STRENGTH) 750 MG chewable tablet Chew 2 tablets by mouth 2 (two) times daily.        . Multiple Vitamin (MULTIVITAMIN) tablet Take 1 tablet by mouth daily.        . Omega-3 Fatty Acids (FISH OIL) 1200 MG CAPS Take 1 capsule by mouth daily.  Review of Systems Review of Systems  Constitutional: Negative for fever, appetite change, fatigue and unexpected weight change.  Eyes: Negative for pain and visual disturbance.  Respiratory: Negative for cough and shortness of breath.   Cardiovascular: Negative for cp or palpitations    Gastrointestinal: Negative for nausea, diarrhea and constipation.  Genitourinary: Negative for urgency and frequency. pos for prolonged menses with cramps and also hot flashes  Skin: Negative for pallor or rash   Neurological: Negative for weakness, light-headedness, numbness and headaches.  Hematological: Negative for adenopathy. Does not bruise/bleed easily.  Psychiatric/Behavioral: Negative for dysphoric mood. The patient is not nervous/anxious.         Objective:   Physical Exam  Constitutional: She appears well-developed and well-nourished. No distress.       obese and well appearing   HENT:  Head: Normocephalic and atraumatic.  Right Ear: External ear normal.  Left Ear: External ear normal.    Nose: Nose normal.  Mouth/Throat: Oropharynx is clear and moist.  Eyes: Conjunctivae normal and EOM are normal. Pupils are equal, round, and reactive to light. Right eye exhibits no discharge. Left eye exhibits no discharge. No scleral icterus.  Neck: Normal range of motion. Neck supple. No JVD present. Carotid bruit is not present. No thyromegaly present.  Cardiovascular: Normal rate, regular rhythm, normal heart sounds and intact distal pulses.  Exam reveals no gallop.   Pulmonary/Chest: Effort normal and breath sounds normal. No respiratory distress. She has no wheezes.  Abdominal: Soft. Bowel sounds are normal. She exhibits no distension, no abdominal bruit and no mass. There is no tenderness.  Genitourinary: No breast swelling, tenderness, discharge or bleeding.       Breast exam: No mass, nodules, thickening, tenderness, bulging, retraction, inflamation, nipple discharge or skin changes noted.  No axillary or clavicular LA.  Chaperoned exam.    Musculoskeletal: She exhibits no edema and no tenderness.  Lymphadenopathy:    She has no cervical adenopathy.  Neurological: She is alert. She has normal reflexes. No cranial nerve deficit. She exhibits normal muscle tone. Coordination normal.  Skin: Skin is warm and dry. No rash noted. No erythema. No pallor.       Raised waxy nevus on R breast consistent with SK  Psychiatric: She has a normal mood and affect.          Assessment & Plan:

## 2012-12-25 NOTE — Assessment & Plan Note (Signed)
Reviewed health habits including diet and exercise and skin cancer prevention Also reviewed health mt list, fam hx and immunizations  Wellness labs rev 

## 2012-12-25 NOTE — Patient Instructions (Addendum)
Start regular exercise - aim to work up to 5 days per week for health/ weight loss and cholesterol  Also cut calories and portions  Drink lots of water  We will refer you to gyn at check out

## 2013-01-21 ENCOUNTER — Encounter: Payer: Self-pay | Admitting: Family Medicine

## 2013-01-21 ENCOUNTER — Ambulatory Visit (INDEPENDENT_AMBULATORY_CARE_PROVIDER_SITE_OTHER): Payer: 59 | Admitting: Family Medicine

## 2013-01-21 VITALS — BP 126/68 | HR 70 | Temp 98.0°F | Ht 65.0 in | Wt 179.2 lb

## 2013-01-21 DIAGNOSIS — Z01818 Encounter for other preprocedural examination: Secondary | ICD-10-CM

## 2013-01-21 NOTE — Patient Instructions (Addendum)
EKG is ok  You are cleared for surgery with no restrictions

## 2013-01-21 NOTE — Assessment & Plan Note (Signed)
Pt recently dx with endometrial carcinoma and will have surgery with Dr Algie Coffer for total hysterectomy and LN dissection  No prev problems with surgery or anesthesia and no drug allergies Is on fish oil- may need to hold that several days pre surg No acute changes on EKG today  She is medically clear for surgery

## 2013-01-21 NOTE — Progress Notes (Signed)
Subjective:    Patient ID: Jane Ruiz, female    DOB: 12-17-1960, 52 y.o.   MRN: [id number]  HPI Here for preop exam for complete hysterectomy - for endometrial cancer  Gyn Dr. Algie Coffer and also oncologist  Will have 2 LN removed   Went in for post menopausal bleeding   In the past she has had surgeries Rhinoplasty - general anethesia  No problems with it  No medicine allergies   No hx of heart or lung problems  No smoking at all  No asthma  Never had a stress test for any reason   Her father died in his 77s   Did get her flu shot this season   Lab Results  Component Value Date   WBC 9.4 11/23/2012   HGB 12.9 11/23/2012   HCT 39.1 11/23/2012   MCV 85.6 11/23/2012   PLT 339.0 11/23/2012      Chemistry      Component Value Date/Time   NA 139 11/23/2012 0844   K 4.1 11/23/2012 0844   CL 106 11/23/2012 0844   CO2 27 11/23/2012 0844   BUN 13 11/23/2012 0844   CREATININE 0.9 11/23/2012 0844      Component Value Date/Time   CALCIUM 9.4 11/23/2012 0844   ALKPHOS 72 11/23/2012 0844   AST 18 11/23/2012 0844   ALT 15 11/23/2012 0844   BILITOT 0.5 11/23/2012 0844       Patient Active Problem List  Diagnosis  . HYPERLIPIDEMIA  . MENOPAUSAL SYNDROME  . PLANTAR FASCIITIS  . Routine general medical examination at a health care facility  . Other screening mammogram  . Obesity  . Excessive menses  . Preoperative exam for gynecologic surgery   Past Medical History  Diagnosis Date  . Migraine   . Hyperlipidemia     borderline high cholesterol  . Skin lesion     non maligant skin lesions on head   Past Surgical History  Procedure Laterality Date  . Rhinoplasty      breathing problems- Dr Haroldine Laws   History  Substance Use Topics  . Smoking status: Never Smoker   . Smokeless tobacco: Not on file  . Alcohol Use: No   Family History  Problem Relation Age of Onset  . Diabetes Mother   . Kidney disease Mother   . Benign prostatic hyperplasia Father     No Known Allergies Current Outpatient Prescriptions on File Prior to Visit  Medication Sig Dispense Refill  . atorvastatin (LIPITOR) 10 MG tablet Take 1 tablet (10 mg total) by mouth daily.  90 tablet  3  . calcium carbonate (ANTACID EXTRA STRENGTH) 750 MG chewable tablet Chew 2 tablets by mouth 2 (two) times daily.        . Multiple Vitamin (MULTIVITAMIN) tablet Take 1 tablet by mouth daily.        . Omega-3 Fatty Acids (FISH OIL) 1200 MG CAPS Take 1 capsule by mouth daily.         No current facility-administered medications on file prior to visit.      Review of Systems Review of Systems  Constitutional: Negative for fever, appetite change, fatigue and unexpected weight change.  Eyes: Negative for pain and visual disturbance.  Respiratory: Negative for cough and shortness of breath.   Cardiovascular: Negative for cp or palpitations    Gastrointestinal: Negative for nausea, diarrhea and constipation.  Genitourinary: Negative for urgency and frequency. pos for post menopausal bleeding  Skin: Negative for pallor or rash  Neurological: Negative for weakness, light-headedness, numbness and headaches.  Hematological: Negative for adenopathy. Does not bruise/bleed easily.  Psychiatric/Behavioral: Negative for dysphoric mood. The patient is not nervous/anxious.         Objective:   Physical Exam  Constitutional: She appears well-developed and well-nourished. No distress.  overwt and well app  HENT:  Head: Normocephalic and atraumatic.  Mouth/Throat: Oropharynx is clear and moist.  Eyes: Conjunctivae and EOM are normal. Pupils are equal, round, and reactive to light. No scleral icterus.  Neck: Normal range of motion. Neck supple. No JVD present. Carotid bruit is not present. No thyromegaly present.  Cardiovascular: Normal rate, regular rhythm, normal heart sounds and intact distal pulses.  Exam reveals no gallop.   Pulmonary/Chest: Effort normal and breath sounds normal. No  respiratory distress. She has no wheezes.  Abdominal: Soft. Bowel sounds are normal. She exhibits no distension and no abdominal bruit. There is no tenderness.  Musculoskeletal: She exhibits no edema.  Lymphadenopathy:    She has no cervical adenopathy.  Neurological: She is alert. She has normal reflexes.  Skin: Skin is warm and dry. No rash noted.  Psychiatric: She has a normal mood and affect.          Assessment & Plan:

## 2013-01-24 ENCOUNTER — Encounter: Payer: Self-pay | Admitting: Gynecologic Oncology

## 2013-01-24 ENCOUNTER — Ambulatory Visit: Payer: 59 | Attending: Gynecologic Oncology | Admitting: Gynecologic Oncology

## 2013-01-24 VITALS — BP 138/70 | HR 64 | Temp 98.6°F | Resp 16 | Ht 64.84 in | Wt 178.2 lb

## 2013-01-24 DIAGNOSIS — C549 Malignant neoplasm of corpus uteri, unspecified: Secondary | ICD-10-CM | POA: Insufficient documentation

## 2013-01-24 DIAGNOSIS — C541 Malignant neoplasm of endometrium: Secondary | ICD-10-CM

## 2013-01-24 DIAGNOSIS — Z79899 Other long term (current) drug therapy: Secondary | ICD-10-CM | POA: Insufficient documentation

## 2013-01-24 NOTE — Progress Notes (Signed)
Consult Note: Gyn-Onc  Consult was requested by Dr.  Fogelman for the evaluation of Jane Ruiz 52 y.o. female  CC:  Chief Complaint  Patient presents with  . Endometrial cancer    New Consult    HPI: This is a 52-year-old gravida 1 para 1 last normal menstrual period in 2011. [name] notated vaginal bleeding on 11/28/2012. She presented to Dr. Fogelman and an endometrial biopsy was collected on 01/06/2013. 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  A vaginal ultrasound collected on 01/16/2013 demonstrates a uterus measuring 6.9 cm in greatest length. The left ovary is noted to have a hyperechoic area or her blood flow which may represent a dermoid cyst or other etiology. A small simple cyst is also noted.  Jane Ruiz states that she has had continuous spotting since the biopsy she denies any weight loss changes in appetite, there've been no changes in bowel or rectal habits. She denies early satiety and denies bleeding from the bladder or rectum.   Current Meds:  Outpatient Encounter Prescriptions as of 01/24/2013  Medication Sig Dispense Refill  . atorvastatin (LIPITOR) 10 MG tablet Take 1 tablet (10 mg total) by mouth daily.  90 tablet  3  . calcium carbonate (ANTACID EXTRA STRENGTH) 750 MG chewable tablet Chew 2 tablets by mouth 2 (two) times daily.        . Multiple Vitamin (MULTIVITAMIN) tablet Take 1 tablet by mouth daily.        . Omega-3 Fatty Acids (FISH OIL) 1200 MG CAPS Take 1 capsule by mouth daily.         No facility-administered encounter medications on file as of 01/24/2013.    Allergy: No Known Allergies  Social Hx:   History   Social History  . Marital Status: Married    Spouse Name: N/A    Number of Children: N/A  . Years of Education: N/A   Occupational History  . Not on file.   Social History Main Topics  . Smoking status: Never Smoker   . Smokeless tobacco: Not on file  .  Alcohol Use: No  . Drug Use: No  . Sexually Active: Yes   Other Topics Concern  . Not on file   Social History Narrative  . No narrative on file    Past Surgical Hx:  Past Surgical History  Procedure Laterality Date  . Rhinoplasty      breathing problems- Dr Crossley    Past Medical Hx:  Past Medical History  Diagnosis Date  . Migraine   . Hyperlipidemia     borderline high cholesterol  . Skin lesion     non maligant skin lesions on head    Past Gynecological History:  Gravida 1 para 1.  Menarche occurred at the age of 8 with regular menses until menopause in 2016. She reports an abnormal Pap test either in her 30s or 40s. Her last Pap test was in 2012 all within normal limits. She reports use of oral contraceptive pills for approximately 6 years.   Family Hx:  Family History  Problem Relation Age of Onset  . Diabetes Mother   . Kidney disease Mother   . Benign prostatic hyperplasia Father     Review of Systems:  Constitutional  Feels well, very anxious Cardiovascular  No chest pain, shortness of breath, or edema  Pulmonary  No cough or wheeze. Snores Gastro Intestinal  No nausea, vomitting, or diarrhoea. No   bright red blood per rectum, no abdominal pain, change in bowel movement, or constipation.  Genito Urinary  No frequency, reports vaginal spotting denies vaginal hemorrhage  Musculo Skeletal  No myalgia, arthralgia, joint swelling or pain  Neurologic  No weakness, numbness, change in gait,  Psychology  No depression, anxiety, insomnia.   Vitals:  Blood pressure 138/70, pulse 64, temperature 98.6 F (37 C), temperature source Oral, resp. rate 16, height 5' 4.84" (1.647 m), weight 178 lb 3.2 oz (80.831 kg).  Physical Exam: WD in NAD, tearful Neck  Supple NROM, without any enlargements.  Lymph Node Survey No cervical supraclavicular or inguinal adenopathy Cardiovascular  Pulse normal rate, regularity and rhythm. S1 and S2 normal.  Lungs  Clear to  auscultation bilateraly, without wheezes/crackles/rhonchi. Good air movement.  Skin  No rash/lesions/breakdown  Psychiatry  Alert and oriented to person, place, and time  Abdomen  Normoactive bowel sounds, abdomen soft, non-tender and obese. No palpable abdominal masses or evidence of ascites Back No CVA tenderness Genito Urinary  Vulva/vagina: Normal external female genitalia.  No lesions. No discharge or bleeding.  Bladder/urethra:  No lesions or masses  Vagina;  atrophic without blood or discharge in the vaginal vault.  No evidence of metastatic disease to the vagina  Cervix: Normal appearing,  3 cm no lesions, no parametrial involvement  Uterus: Small, mobile,   Adnexa: No palpable masses, no cul-de-sac nodularity Rectal  Good tone, no masses no cul de sac nodularity.  Extremities  No bilateral cyanosis, clubbing or edema.   Assessment/Plan:  Ms. Jane Ruiz  is a 52 y.o.  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  Recommendation for endometrial cancer staging was made and I presented the options of an open versus minimally invasive approach.  Patient opts for minimally invasive approach. I extensively reviewed the risks of robotic hysterectomy and lymph node dissection of infection, bleeding, damage to nearby organs including bowel, bladder, vessels, nerves, and ureters. We discussed postoperative risks including infection, possible need for conversion to open laparotomy.  I discussed positioning during surgery of trendelenberg and risks of minor facial swelling and care we take in preoperative positioning. We also reviewed possible need for further treatment such as radiation or chemotherapy based on final pathology. Expected postoperative recovery was also discussed.  The proposed surgical date is 02/13/2012.  The questions of the patient and that of her husband were answered to their satisfaction.  I reviewed the  postoperative clearance that has been obtained.    Maridee Slape, MD, PhD 01/24/2013, 10:47 AM  

## 2013-01-24 NOTE — Patient Instructions (Signed)
Recommendation for endometrial cancer staging was made and I presented the options of an open versus minimally invasive approach.    I extensively reviewed the risks of robotic hysterectomy and lymph node dissection of infection, bleeding, damage to nearby organs including bowel, bladder, vessels, nerves, and ureters. We discussed postoperative risks including infection, possible need for conversion to open laparotomy.  I discussed positioning during surgery of trendelenberg and risks of minor facial swelling and care we take in preoperative positioning. We also reviewed possible need for further treatment such as radiation or chemotherapy based on final pathology. Expected postoperative recovery was also discussed.  The proposed surgical date is 02/13/2012.     Thank you very much Jane Ruiz for allowing me to provide care for you today.  I appreciate your confidence in choosing our Gynecologic Oncology team.  If you have any questions about your visit today please call our office and we will get back to you as soon as possible.  Maryclare Labrador. Aleyda Gindlesperger MD., PhD Gynecologic Oncology

## 2013-01-28 ENCOUNTER — Encounter (HOSPITAL_COMMUNITY): Payer: Self-pay | Admitting: Pharmacy Technician

## 2013-02-05 ENCOUNTER — Telehealth: Payer: Self-pay | Admitting: Gynecologic Oncology

## 2013-02-07 NOTE — Progress Notes (Signed)
OV woth EKG and clearance Dr Milinda Antis 2/14 on chart

## 2013-02-07 NOTE — Patient Instructions (Addendum)
18 Jane Ruiz  02/07/2013   Your procedure is scheduled on:02/12/13 Tuesday    Report to [facility] at    0900   AM.  Call this number if you have problems the morning of surgery: [phone]       Remember:   Do not eat food  :After Midnight. Sunday NIGHT-  BEGIN CLEAR LIQUIDS ONLY ON Monday.  NOTHING BY MOUTH AFTER MIDNIGHT Monday NIGHT   Take these medicines the morning of surgery with A SIP OF WATER: NONE   .  Contacts, dentures or partial plates can not be worn to surgery  Leave suitcase in the car. After surgery it may be brought to your room.  For patients admitted to the hospital, checkout time is 11:00 AM day of  discharge.             SPECIAL INSTRUCTIONS- SEE [place] PREPARING FOR SURGERY INSTRUCTION SHEET-     DO NOT WEAR JEWELRY, LOTIONS, POWDERS, OR PERFUMES.  WOMEN-- DO NOT SHAVE LEGS OR UNDERARMS FOR 12 HOURS BEFORE SHOWERS. MEN MAY SHAVE FACE.  Patients discharged the day of surgery will not be allowed to drive home. IF going home the day of surgery, you must have a driver and someone to stay with you for the first 24 hours  Name and phone number of your driver:     husband                                                                   Please read over the following fact sheets that you were given: MRSA Information, Incentive Spirometry Sheet, Blood Transfusion Sheet  Information                                                                                   [place]  PST [phone]  [phone]                 FAILURE TO FOLLOW THESE INSTRUCTIONS MAY RESULT IN  CANCELLATION   OF YOUR SURGERY                                                  Patient Signature _____________________________

## 2013-02-08 ENCOUNTER — Encounter (HOSPITAL_COMMUNITY)
Admission: RE | Admit: 2013-02-08 | Discharge: 2013-02-08 | Disposition: A | Discharge status: Home / Self Care | Payer: 59 | Source: Ambulatory Visit | Attending: Gynecologic Oncology | Admitting: Gynecologic Oncology

## 2013-02-08 ENCOUNTER — Encounter (HOSPITAL_COMMUNITY): Payer: Self-pay

## 2013-02-08 ENCOUNTER — Ambulatory Visit (HOSPITAL_COMMUNITY)
Admission: RE | Admit: 2013-02-08 | Discharge: 2013-02-08 | Disposition: A | Discharge status: Home / Self Care | Payer: 59 | Source: Ambulatory Visit | Attending: Gynecologic Oncology | Admitting: Gynecologic Oncology

## 2013-02-08 DIAGNOSIS — Z01812 Encounter for preprocedural laboratory examination: Secondary | ICD-10-CM | POA: Insufficient documentation

## 2013-02-08 DIAGNOSIS — Z01818 Encounter for other preprocedural examination: Secondary | ICD-10-CM | POA: Insufficient documentation

## 2013-02-08 DIAGNOSIS — C549 Malignant neoplasm of corpus uteri, unspecified: Secondary | ICD-10-CM | POA: Insufficient documentation

## 2013-02-08 HISTORY — DX: Malignant (primary) neoplasm, unspecified: C80.1

## 2013-02-08 LAB — CBC WITH DIFFERENTIAL/PLATELET
Basophils Absolute: 0 10*3/uL (ref 0.0–0.1)
Basophils Relative: 0 % (ref 0–1)
Eosinophils Relative: 2 % (ref 0–5)
HCT: 38.9 % (ref 36.0–46.0)
MCHC: 32.6 g/dL (ref 30.0–36.0)
MCV: 85.3 fL (ref 78.0–100.0)
Monocytes Absolute: 0.7 10*3/uL (ref 0.1–1.0)
Neutro Abs: 5.2 10*3/uL (ref 1.7–7.7)
RDW: 13.8 % (ref 11.5–15.5)

## 2013-02-08 LAB — COMPREHENSIVE METABOLIC PANEL
AST: 13 U/L (ref 0–37)
Albumin: 3.9 g/dL (ref 3.5–5.2)
Calcium: 9.4 mg/dL (ref 8.4–10.5)
Creatinine, Ser: 0.76 mg/dL (ref 0.50–1.10)
Total Protein: 7.5 g/dL (ref 6.0–8.3)

## 2013-02-08 LAB — SURGICAL PCR SCREEN: Staphylococcus aureus: INVALID — AB

## 2013-02-08 LAB — ABO/RH: ABO/RH(D): A POS

## 2013-02-11 LAB — MRSA CULTURE

## 2013-02-11 NOTE — Telephone Encounter (Signed)
M

## 2013-02-12 ENCOUNTER — Encounter (HOSPITAL_COMMUNITY)
Admission: RE | Disposition: A | Discharge status: Home / Self Care | Payer: Self-pay | Source: Ambulatory Visit | Attending: Obstetrics & Gynecology

## 2013-02-12 ENCOUNTER — Ambulatory Visit (HOSPITAL_COMMUNITY): Payer: 59 | Admitting: Anesthesiology

## 2013-02-12 ENCOUNTER — Encounter (HOSPITAL_COMMUNITY): Payer: Self-pay | Admitting: *Deleted

## 2013-02-12 ENCOUNTER — Ambulatory Visit (HOSPITAL_COMMUNITY)
Admission: RE | Admit: 2013-02-12 | Discharge: 2013-02-13 | Disposition: A | Discharge status: Home / Self Care | Payer: 59 | Source: Ambulatory Visit | Attending: Obstetrics & Gynecology | Admitting: Obstetrics & Gynecology

## 2013-02-12 ENCOUNTER — Encounter (HOSPITAL_COMMUNITY): Payer: Self-pay | Admitting: Anesthesiology

## 2013-02-12 DIAGNOSIS — R51 Headache: Secondary | ICD-10-CM | POA: Insufficient documentation

## 2013-02-12 DIAGNOSIS — C801 Malignant (primary) neoplasm, unspecified: Secondary | ICD-10-CM

## 2013-02-12 DIAGNOSIS — N80209 Endometriosis of unspecified fallopian tube, unspecified depth: Secondary | ICD-10-CM | POA: Insufficient documentation

## 2013-02-12 DIAGNOSIS — Z79899 Other long term (current) drug therapy: Secondary | ICD-10-CM | POA: Insufficient documentation

## 2013-02-12 DIAGNOSIS — N838 Other noninflammatory disorders of ovary, fallopian tube and broad ligament: Secondary | ICD-10-CM | POA: Insufficient documentation

## 2013-02-12 DIAGNOSIS — C549 Malignant neoplasm of corpus uteri, unspecified: Secondary | ICD-10-CM

## 2013-02-12 DIAGNOSIS — N8000 Endometriosis of the uterus, unspecified: Secondary | ICD-10-CM | POA: Insufficient documentation

## 2013-02-12 DIAGNOSIS — C775 Secondary and unspecified malignant neoplasm of intrapelvic lymph nodes: Secondary | ICD-10-CM | POA: Insufficient documentation

## 2013-02-12 DIAGNOSIS — N802 Endometriosis of fallopian tube: Secondary | ICD-10-CM | POA: Insufficient documentation

## 2013-02-12 HISTORY — PX: ROBOTIC ASSISTED TOTAL HYSTERECTOMY WITH BILATERAL SALPINGO OOPHERECTOMY: SHX6086

## 2013-02-12 HISTORY — PX: ABDOMINAL HYSTERECTOMY: SHX81

## 2013-02-12 HISTORY — DX: Malignant (primary) neoplasm, unspecified: C80.1

## 2013-02-12 LAB — TYPE AND SCREEN: Antibody Screen: NEGATIVE

## 2013-02-12 SURGERY — ROBOTIC ASSISTED TOTAL HYSTERECTOMY WITH BILATERAL SALPINGO OOPHORECTOMY
Anesthesia: General | Wound class: Clean Contaminated

## 2013-02-12 MED ORDER — OXYCODONE-ACETAMINOPHEN 5-325 MG PO TABS
1.0000 | ORAL_TABLET | ORAL | Status: DC | PRN
  Administered 2013-02-12: 1 via ORAL
  Administered 2013-02-13: 2 via ORAL
  Filled 2013-02-12: qty 1
  Filled 2013-02-12: qty 2

## 2013-02-12 MED ORDER — KETAMINE HCL 10 MG/ML IJ SOLN
INTRAMUSCULAR | Status: DC | PRN
  Administered 2013-02-12: 10 mg via INTRAVENOUS

## 2013-02-12 MED ORDER — KETOROLAC TROMETHAMINE 30 MG/ML IJ SOLN
30.0000 mg | Freq: Four times a day (QID) | INTRAMUSCULAR | Status: DC
  Filled 2013-02-12 (×4): qty 1

## 2013-02-12 MED ORDER — DEXAMETHASONE SODIUM PHOSPHATE 10 MG/ML IJ SOLN
INTRAMUSCULAR | Status: DC | PRN
  Administered 2013-02-12: 10 mg via INTRAVENOUS

## 2013-02-12 MED ORDER — BIOTENE DRY MOUTH MT LIQD
15.0000 mL | Freq: Two times a day (BID) | OROMUCOSAL | Status: DC
  Administered 2013-02-12 – 2013-02-13 (×2): 15 mL via OROMUCOSAL

## 2013-02-12 MED ORDER — ONDANSETRON HCL 4 MG/2ML IJ SOLN: 4.0000 mg | Freq: Four times a day (QID) | INTRAMUSCULAR | Status: DC | PRN

## 2013-02-12 MED ORDER — ACETAMINOPHEN 10 MG/ML IV SOLN
INTRAVENOUS | Status: AC
  Filled 2013-02-12: qty 100

## 2013-02-12 MED ORDER — LACTATED RINGERS IV SOLN
INTRAVENOUS | Status: DC
  Administered 2013-02-12: 1000 mL via INTRAVENOUS

## 2013-02-12 MED ORDER — HYDROMORPHONE HCL PF 1 MG/ML IJ SOLN
0.2500 mg | INTRAMUSCULAR | Status: DC | PRN
Start: 2013-02-12 — End: 2013-02-12

## 2013-02-12 MED ORDER — PROPOFOL 10 MG/ML IV EMUL
INTRAVENOUS | Status: DC | PRN
  Administered 2013-02-12: 50 mg via INTRAVENOUS
  Administered 2013-02-12: 70 mg via INTRAVENOUS
  Administered 2013-02-12: 150 mg via INTRAVENOUS

## 2013-02-12 MED ORDER — CISATRACURIUM BESYLATE (PF) 10 MG/5ML IV SOLN
INTRAVENOUS | Status: DC | PRN
  Administered 2013-02-12: 2 mg via INTRAVENOUS
  Administered 2013-02-12: 8 mg via INTRAVENOUS

## 2013-02-12 MED ORDER — PROMETHAZINE HCL 25 MG/ML IJ SOLN: 6.2500 mg | INTRAMUSCULAR | Status: DC | PRN

## 2013-02-12 MED ORDER — KCL IN DEXTROSE-NACL 20-5-0.45 MEQ/L-%-% IV SOLN
INTRAVENOUS | Status: DC
  Administered 2013-02-12: 125 mL/h via INTRAVENOUS
  Administered 2013-02-12 – 2013-02-13 (×2): via INTRAVENOUS
  Filled 2013-02-12 (×5): qty 1000

## 2013-02-12 MED ORDER — SUCCINYLCHOLINE CHLORIDE 20 MG/ML IJ SOLN
INTRAMUSCULAR | Status: DC | PRN
  Administered 2013-02-12 (×2): 100 mg via INTRAVENOUS

## 2013-02-12 MED ORDER — LIDOCAINE HCL (CARDIAC) 20 MG/ML IV SOLN
INTRAVENOUS | Status: DC | PRN
  Administered 2013-02-12: 80 mg via INTRAVENOUS

## 2013-02-12 MED ORDER — ACETAMINOPHEN 10 MG/ML IV SOLN
INTRAVENOUS | Status: DC | PRN
  Administered 2013-02-12: 1000 mg via INTRAVENOUS

## 2013-02-12 MED ORDER — NEOSTIGMINE METHYLSULFATE 1 MG/ML IJ SOLN
INTRAMUSCULAR | Status: DC | PRN
  Administered 2013-02-12: 1 mg via INTRAVENOUS

## 2013-02-12 MED ORDER — FENTANYL CITRATE 0.05 MG/ML IJ SOLN
INTRAMUSCULAR | Status: DC | PRN
  Administered 2013-02-12: 50 ug via INTRAVENOUS
  Administered 2013-02-12: 25 ug via INTRAVENOUS
  Administered 2013-02-12: 100 ug via INTRAVENOUS
  Administered 2013-02-12: 25 ug via INTRAVENOUS
  Administered 2013-02-12: 50 ug via INTRAVENOUS

## 2013-02-12 MED ORDER — CEFAZOLIN SODIUM-DEXTROSE 2-3 GM-% IV SOLR
2.0000 g | INTRAVENOUS | Status: AC
  Administered 2013-02-12: 2 g via INTRAVENOUS

## 2013-02-12 MED ORDER — HYDROMORPHONE HCL PF 1 MG/ML IJ SOLN
0.5000 mg | INTRAMUSCULAR | Status: DC | PRN
  Administered 2013-02-12: 0.5 mg via INTRAVENOUS
  Filled 2013-02-12: qty 1

## 2013-02-12 MED ORDER — ZOLPIDEM TARTRATE 5 MG PO TABS: 5.0000 mg | ORAL_TABLET | Freq: Every evening | ORAL | Status: DC | PRN

## 2013-02-12 MED ORDER — HYDROMORPHONE HCL PF 1 MG/ML IJ SOLN
INTRAMUSCULAR | Status: DC | PRN
  Administered 2013-02-12: .5 mg via INTRAVENOUS
  Administered 2013-02-12 (×3): 0.5 mg via INTRAVENOUS

## 2013-02-12 MED ORDER — LACTATED RINGERS IV SOLN
INTRAVENOUS | Status: DC | PRN
  Administered 2013-02-12 (×2): via INTRAVENOUS

## 2013-02-12 MED ORDER — GLYCOPYRROLATE 0.2 MG/ML IJ SOLN
INTRAMUSCULAR | Status: DC | PRN
  Administered 2013-02-12: .2 mg via INTRAVENOUS

## 2013-02-12 MED ORDER — CEFAZOLIN SODIUM-DEXTROSE 2-3 GM-% IV SOLR
INTRAVENOUS | Status: AC
  Filled 2013-02-12: qty 50

## 2013-02-12 MED ORDER — ONDANSETRON HCL 4 MG/2ML IJ SOLN
INTRAMUSCULAR | Status: DC | PRN
  Administered 2013-02-12: 4 mg via INTRAVENOUS

## 2013-02-12 MED ORDER — LACTATED RINGERS IV SOLN
INTRAVENOUS | Status: DC | PRN
  Administered 2013-02-12: 1000 mL

## 2013-02-12 MED ORDER — MIDAZOLAM HCL 5 MG/5ML IJ SOLN
INTRAMUSCULAR | Status: DC | PRN
  Administered 2013-02-12 (×2): 1 mg via INTRAVENOUS

## 2013-02-12 MED ORDER — KETOROLAC TROMETHAMINE 30 MG/ML IJ SOLN
30.0000 mg | Freq: Four times a day (QID) | INTRAMUSCULAR | Status: DC
  Administered 2013-02-12 – 2013-02-13 (×4): 30 mg via INTRAVENOUS
  Filled 2013-02-12 (×8): qty 1

## 2013-02-12 MED ORDER — KETOROLAC TROMETHAMINE 30 MG/ML IJ SOLN
INTRAMUSCULAR | Status: AC
  Administered 2013-02-12: 30 mg
  Filled 2013-02-12: qty 1

## 2013-02-12 MED ORDER — STERILE WATER FOR IRRIGATION IR SOLN
Status: DC | PRN
  Administered 2013-02-12: 3000 mL

## 2013-02-12 SURGICAL SUPPLY — 53 items
APL SKNCLS STERI-STRIP NONHPOA (GAUZE/BANDAGES/DRESSINGS)
BAG SPEC RTRVL LRG 6X4 10 (ENDOMECHANICALS) ×2
BENZOIN TINCTURE PRP APPL 2/3 (GAUZE/BANDAGES/DRESSINGS) ×1 IMPLANT
CABLE HI FREQUENCY MONOPOLAR (ELECTROSURGICAL) ×1 IMPLANT
CHLORAPREP W/TINT 26ML (MISCELLANEOUS) ×2 IMPLANT
CLOTH BEACON ORANGE TIMEOUT ST (SAFETY) ×2 IMPLANT
CORD HIGH FREQUENCY UNIPOLAR (ELECTROSURGICAL) ×2 IMPLANT
CORDS BIPOLAR (ELECTRODE) ×2 IMPLANT
COVER MAYO STAND STRL (DRAPES) ×2 IMPLANT
COVER SURGICAL LIGHT HANDLE (MISCELLANEOUS) ×2 IMPLANT
COVER TIP SHEARS 8 DVNC (MISCELLANEOUS) ×1 IMPLANT
COVER TIP SHEARS 8MM DA VINCI (MISCELLANEOUS) ×1
DECANTER SPIKE VIAL GLASS SM (MISCELLANEOUS) ×1 IMPLANT
DRAPE LG THREE QUARTER DISP (DRAPES) ×4 IMPLANT
DRAPE SURG IRRIG POUCH 19X23 (DRAPES) ×2 IMPLANT
DRAPE TABLE BACK 44X90 PK DISP (DRAPES) ×2 IMPLANT
DRAPE UTILITY XL STRL (DRAPES) ×2 IMPLANT
DRAPE WARM FLUID 44X44 (DRAPE) ×2 IMPLANT
DRSG TEGADERM 2-3/8X2-3/4 SM (GAUZE/BANDAGES/DRESSINGS) ×4 IMPLANT
DRSG TEGADERM 6X8 (GAUZE/BANDAGES/DRESSINGS) ×4 IMPLANT
ELECT REM PT RETURN 9FT ADLT (ELECTROSURGICAL) ×2
ELECTRODE REM PT RTRN 9FT ADLT (ELECTROSURGICAL) ×1 IMPLANT
GAUZE VASELINE 3X9 (GAUZE/BANDAGES/DRESSINGS) IMPLANT
GLOVE BIO SURGEON STRL SZ 6.5 (GLOVE) ×8 IMPLANT
GLOVE BIO SURGEON STRL SZ7.5 (GLOVE) ×4 IMPLANT
GLOVE BIOGEL PI IND STRL 7.0 (GLOVE) ×2 IMPLANT
GLOVE BIOGEL PI INDICATOR 7.0 (GLOVE) ×2
GOWN PREVENTION PLUS XLARGE (GOWN DISPOSABLE) ×7 IMPLANT
HOLDER FOLEY CATH W/STRAP (MISCELLANEOUS) ×2 IMPLANT
KIT ACCESSORY DA VINCI DISP (KITS) ×1
KIT ACCESSORY DVNC DISP (KITS) ×1 IMPLANT
MANIPULATOR UTERINE 4.5 ZUMI (MISCELLANEOUS) ×2 IMPLANT
OCCLUDER COLPOPNEUMO (BALLOONS) ×2 IMPLANT
PACK LAPAROSCOPY W LONG (CUSTOM PROCEDURE TRAY) ×2 IMPLANT
POUCH SPECIMEN RETRIEVAL 10MM (ENDOMECHANICALS) ×4 IMPLANT
SET TUBE IRRIG SUCTION NO TIP (IRRIGATION / IRRIGATOR) ×2 IMPLANT
SHEET LAVH (DRAPES) ×2 IMPLANT
SOLUTION ELECTROLUBE (MISCELLANEOUS) ×2 IMPLANT
SPONGE LAP 18X18 X RAY DECT (DISPOSABLE) IMPLANT
STRIP CLOSURE SKIN 1/2X4 (GAUZE/BANDAGES/DRESSINGS) ×2 IMPLANT
SUT VIC AB 0 CT1 27 (SUTURE) ×2
SUT VIC AB 0 CT1 27XBRD ANTBC (SUTURE) ×3 IMPLANT
SUT VIC AB 4-0 PS2 27 (SUTURE) ×4 IMPLANT
SUT VICRYL 0 UR6 27IN ABS (SUTURE) ×3 IMPLANT
SYR 50ML LL SCALE MARK (SYRINGE) ×2 IMPLANT
SYR BULB IRRIGATION 50ML (SYRINGE) IMPLANT
TOWEL OR 17X26 10 PK STRL BLUE (TOWEL DISPOSABLE) ×2 IMPLANT
TRAP SPECIMEN MUCOUS 40CC (MISCELLANEOUS) ×1 IMPLANT
TRAY FOLEY CATH 14FRSI W/METER (CATHETERS) ×2 IMPLANT
TROCAR 12M 150ML BLUNT (TROCAR) ×2 IMPLANT
TROCAR XCEL 12X100 BLDLESS (ENDOMECHANICALS) ×2 IMPLANT
TROCAR XCEL BLADELESS 5X75MML (TROCAR) ×2 IMPLANT
WATER STERILE IRR 1500ML POUR (IV SOLUTION) ×2 IMPLANT

## 2013-02-12 NOTE — Transfer of Care (Signed)
Immediate Anesthesia Transfer of Care Note  Patient: Jane Ruiz  Procedure(s) Performed: Procedure(s): ROBOTIC ASSISTED TOTAL HYSTERECTOMY WITH BILATERAL SALPINGO OOPHORECTOMY,  LYMPH NODES (N/A)  Patient Location: PACU  Anesthesia Type:General  Level of Consciousness: awake, alert  and oriented  Airway & Oxygen Therapy: Patient connected to face mask oxygen  Post-op Assessment: Report given to PACU RN and Post -op Vital signs reviewed and stable  Post vital signs: stable  Complications: No apparent anesthesia complications

## 2013-02-12 NOTE — H&P (View-Only) (Signed)
Consult Note: Gyn-Onc  Consult was requested by Dr.  Algie Coffer for the evaluation of Jane Ruiz 52 y.o. female  CC:  Chief Complaint  Patient presents with  . Endometrial cancer    New Consult    HPI: This is a 52 year old gravida 1 para 1 last normal menstrual period in 2011. Ms.  [name] notated vaginal bleeding on 11/28/2012. She presented to Dr. Algie Coffer and an endometrial biopsy was collected on 01/06/2013. 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  A vaginal ultrasound collected on 01/16/2013 demonstrates a uterus measuring 6.9 cm in greatest length. The left ovary is noted to have a hyperechoic area or her blood flow which may represent a dermoid cyst or other etiology. A small simple cyst is also noted.  BRITTYN SALAZ states that she has had continuous spotting since the biopsy she denies any weight loss changes in appetite, there've been no changes in bowel or rectal habits. She denies early satiety and denies bleeding from the bladder or rectum.   Current Meds:  Outpatient Encounter Prescriptions as of 01/24/2013  Medication Sig Dispense Refill  . atorvastatin (LIPITOR) 10 MG tablet Take 1 tablet (10 mg total) by mouth daily.  90 tablet  3  . calcium carbonate (ANTACID EXTRA STRENGTH) 750 MG chewable tablet Chew 2 tablets by mouth 2 (two) times daily.        . Multiple Vitamin (MULTIVITAMIN) tablet Take 1 tablet by mouth daily.        . Omega-3 Fatty Acids (FISH OIL) 1200 MG CAPS Take 1 capsule by mouth daily.         No facility-administered encounter medications on file as of 01/24/2013.    Allergy: No Known Allergies  Social Hx:   History   Social History  . Marital Status: Married    Spouse Name: N/A    Number of Children: N/A  . Years of Education: N/A   Occupational History  . Not on file.   Social History Main Topics  . Smoking status: Never Smoker   . Smokeless tobacco: Not on file  .  Alcohol Use: No  . Drug Use: No  . Sexually Active: Yes   Other Topics Concern  . Not on file   Social History Narrative  . No narrative on file    Past Surgical Hx:  Past Surgical History  Procedure Laterality Date  . Rhinoplasty      breathing problems- Dr Haroldine Laws    Past Medical Hx:  Past Medical History  Diagnosis Date  . Migraine   . Hyperlipidemia     borderline high cholesterol  . Skin lesion     non maligant skin lesions on head    Past Gynecological History:  Gravida 1 para 1.  Menarche occurred at the age of 36 with regular menses until menopause in 2016. She reports an abnormal Pap test either in her 30s or 40s. Her last Pap test was in 2012 all within normal limits. She reports use of oral contraceptive pills for approximately 6 years.   Family Hx:  Family History  Problem Relation Age of Onset  . Diabetes Mother   . Kidney disease Mother   . Benign prostatic hyperplasia Father     Review of Systems:  Constitutional  Feels well, very anxious Cardiovascular  No chest pain, shortness of breath, or edema  Pulmonary  No cough or wheeze. Snores Gastro Intestinal  No nausea, vomitting, or diarrhoea. No  bright red blood per rectum, no abdominal pain, change in bowel movement, or constipation.  Genito Urinary  No frequency, reports vaginal spotting denies vaginal hemorrhage  Musculo Skeletal  No myalgia, arthralgia, joint swelling or pain  Neurologic  No weakness, numbness, change in gait,  Psychology  No depression, anxiety, insomnia.   Vitals:  Blood pressure 138/70, pulse 64, temperature 98.6 F (37 C), temperature source Oral, resp. rate 16, height 5' 4.84" (1.647 m), weight 178 lb 3.2 oz (80.831 kg).  Physical Exam: WD in NAD, tearful Neck  Supple NROM, without any enlargements.  Lymph Node Survey No cervical supraclavicular or inguinal adenopathy Cardiovascular  Pulse normal rate, regularity and rhythm. S1 and S2 normal.  Lungs  Clear to  auscultation bilateraly, without wheezes/crackles/rhonchi. Good air movement.  Skin  No rash/lesions/breakdown  Psychiatry  Alert and oriented to person, place, and time  Abdomen  Normoactive bowel sounds, abdomen soft, non-tender and obese. No palpable abdominal masses or evidence of ascites Back No CVA tenderness Genito Urinary  Vulva/vagina: Normal external female genitalia.  No lesions. No discharge or bleeding.  Bladder/urethra:  No lesions or masses  Vagina;  atrophic without blood or discharge in the vaginal vault.  No evidence of metastatic disease to the vagina  Cervix: Normal appearing,  3 cm no lesions, no parametrial involvement  Uterus: Small, mobile,   Adnexa: No palpable masses, no cul-de-sac nodularity Rectal  Good tone, no masses no cul de sac nodularity.  Extremities  No bilateral cyanosis, clubbing or edema.   Assessment/Plan:  [name]  is a 52 y.o.  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  Recommendation for endometrial cancer staging was made and I presented the options of an open versus minimally invasive approach.  Patient opts for minimally invasive approach. I extensively reviewed the risks of robotic hysterectomy and lymph node dissection of infection, bleeding, damage to nearby organs including bowel, bladder, vessels, nerves, and ureters. We discussed postoperative risks including infection, possible need for conversion to open laparotomy.  I discussed positioning during surgery of trendelenberg and risks of minor facial swelling and care we take in preoperative positioning. We also reviewed possible need for further treatment such as radiation or chemotherapy based on final pathology. Expected postoperative recovery was also discussed.  The proposed surgical date is 02/13/2012.  The questions of the patient and that of her husband were answered to their satisfaction.  I reviewed the  postoperative clearance that has been obtained.    Laurette Schimke, MD, PhD 01/24/2013, 10:47 AM

## 2013-02-12 NOTE — Anesthesia Preprocedure Evaluation (Signed)
Anesthesia Evaluation  Patient identified by MRN, date of birth, ID band Patient awake    Reviewed: Allergy & Precautions, H&P , NPO status , Patient's Chart, lab work & pertinent test results, reviewed documented beta blocker date and time   Airway Mallampati: II TM Distance: >3 FB Neck ROM: full    Dental no notable dental hx.    Pulmonary neg pulmonary ROS,  breath sounds clear to auscultation  Pulmonary exam normal       Cardiovascular Exercise Tolerance: Good negative cardio ROS  Rhythm:regular Rate:Normal     Neuro/Psych  Headaches, negative neurological ROS  negative psych ROS   GI/Hepatic negative GI ROS, Neg liver ROS,   Endo/Other  negative endocrine ROS  Renal/GU negative Renal ROS  negative genitourinary   Musculoskeletal   Abdominal   Peds  Hematology negative hematology ROS (+)   Anesthesia Other Findings   Reproductive/Obstetrics negative OB ROS                           Anesthesia Physical Anesthesia Plan  ASA: II  Anesthesia Plan: General   Post-op Pain Management:    Induction: Intravenous  Airway Management Planned:   Additional Equipment:   Intra-op Plan:   Post-operative Plan: Extubation in OR  Informed Consent: I have reviewed the patients History and Physical, chart, labs and discussed the procedure including the risks, benefits and alternatives for the proposed anesthesia with the patient or authorized representative who has indicated his/her understanding and acceptance.   Dental Advisory Given  Plan Discussed with: CRNA  Anesthesia Plan Comments:         Anesthesia Quick Evaluation

## 2013-02-12 NOTE — Op Note (Signed)
PATIENT: Jane Ruiz DATE OF BIRTH: 1961-06-06 ENCOUNTER DATE: 02/12/2013   Preop Diagnosis: grade 1 endometrioid adenocarcinoma.   Postoperative Diagnosis: same.   Surgery: Total robotic hysterectomy bilateral salpingo-oophorectomy, bilateral pelvic lymph node dissection.   Surgeons:  Bernita Buffy. Duard Brady, MD; Antionette Char, MD   Assistant: Telford Nab   Anesthesia: General   Estimated blood loss: 25 ml   IVF: 1000 ml   Urine output: 300 ml   Complications: None   Pathology: Uterus, cervix, bilateral tubes and ovaries, bilateral pelvic lymph nodes to pathology.   Operative findings: Physiologic adhesions of rectosigmoid colon to left pelvic sidewall.  Small normal appearing uterus with normal adnexa. Frozen section revealed a grade 1 endometrial adenocarcinoma with minimal myometrial invasion.   Procedure: The patient was identified in the preoperative holding area. Informed consent was signed on the chart. Patient was seen history was reviewed and exam was performed.   The patient was then taken to the operating room and placed in the supine position with SCD hose on. General anesthesia was then induced without difficulty. She was then placed in the dorsolithotomy position. Her arms were tucked at her side with appropriate precautions on the gel pad. Shoulder blocks were then placed in the usual fashion with appropriate precautions. A OG-tube was placed to suction. First timeout was performed to confirm the patient, procedure, antibiotic, allergy status, estimated estimated blood loss, and OR time. The perineum was then prepped in the usual fashion with Betadine. A 14 French Foley was inserted into the bladder under sterile conditions. A sterile speculum was placed in the vagina. The cervix was without lesions. The cervix was grasped with a single-tooth tenaculum. The dilator without difficulty. A ZUMI with a medium Koe ring was placed without difficulty. The abdomen was then  prepped with 1 Chlor prep sponge per protocol.   Patient was then draped after the prep was dried. Second timeout was performed to confirm the above. After again confirming OG tube placement and it was to suction. A stab-wound was made in left upper quadrant 2 cm below the costal margin on the left in the midclavicular line. A 5 mm operative report was used to assure intra-abdominal placement. The abdomen was insufflated. At this point all points during the procedure the patient's intra-abdominal pressure was not increased over 15 mm of mercury. After insufflation was complete, the patient was placed in deep Trendelenburg position. 25 cm above the pubic symphysis that area was marked the camera port. Bilateral robotic ports were marked 10 cm from the midline incision at approximately 5 angle. Under direct visualization each of the trochars was placed into the abdomen. The small bowel was folded on its mesentery to allow visualization to the pelvis. The 5 mm LUQ port was then converted to a 10/12 port under direct visualization.  After assuring adequate visualization, the robot was then docked in the usual fashion. Under direct visualization the robotic instruments replaced.   The round ligament on the patient's right side was transected with monopolar cautery. The anterior and posterior leaves of the broad ligament were then taken down in the usual fashion. The ureter was identified on the medial leaf of the broad ligament. A window was made between the IP and the ureter. The IP was coagulated with bipolar cautery and transected. The posterior leaf of the broad ligament was taken down to the level of the KOH ring. The bladder flap was created using meticulous dissection and pinpoint cautery. The uterine vessels were coagulated with  bipolar cautery. The uterine vessels were then transected and the C loop was created. The same procedure was performed on the patient's left side.   The pneumo-occulder in the  vagina was then insufflated. The colpotomy was then created in the usual fashion. The specimen was then delivered to the vagina and sent for frozen section. Our attention was then drawn to opening the paravesical space on her right side and the perirectal space was also opened. The obturator nerve was identified. The nodal bundle extending over the external iliac artery down to the external iliac vein was taken down using sharp dissection and monopolar cautery. The genitofemoral nerve was identified and spared. We continued our dissection down to the level of the obturator nerve. The nodal bundle superior to the obturator nerve was taken. All pedicles were noted to be hemostatic the ureter was noted to be well medial of the area of dissection. The nodal bundle was then placed and an Endo catch bag. We repeated this procedure on the patient's left side.  However, the frozen section returned as minimal myometrial invasion of grade 1 lesion prior to completing the obturator node dissection. The decision was made to stop the procedure is no further lymphadenectomy was indicated. The external and internal nodal bundles were placed in an endocatch bag. All specimens and one raytec were delivered through the vagina.  The vaginal cuff was closed with a running 0 Vicryl on CT 1 suture. The abdomen and pelvis were copiously irrigated and noted to be hemostatic. The robotic instruments were removed under direct visualization as were the robotic trochars. The pneumoperitoneum was removed and she was given several deep breaths by anesthesia. The patient was then taken out of the Trendelenburg position. Using of 0 Vicryl on a UR 6 needle the midline port fascia was closed and the subcutaneous tissue in the left upper quadrant ports were reapproximated. The skin was closed using 4-0 Vicryl. Steri-Strips and benzoin were applied. The pneumo occluded balloon was removed from the vagina. The vagina was swabbed and noted to be  hemostatic.   All instrument needle and Ray-Tec counts were correct x2. The patient tolerated the procedure well and was taken to the recovery room in stable condition. This is [facility] dictating an operative note on patient Jane Ruiz.

## 2013-02-12 NOTE — Anesthesia Postprocedure Evaluation (Signed)
  Anesthesia Post-op Note  Patient: Jane Ruiz  Procedure(s) Performed: Procedure(s) (LRB): ROBOTIC ASSISTED TOTAL HYSTERECTOMY WITH BILATERAL SALPINGO OOPHORECTOMY,  LYMPH NODES (N/A)  Patient Location: PACU  Anesthesia Type: General  Level of Consciousness: awake and alert   Airway and Oxygen Therapy: Patient Spontanous Breathing  Post-op Pain: mild  Post-op Assessment: Post-op Vital signs reviewed, Patient's Cardiovascular Status Stable, Respiratory Function Stable, Patent Airway and No signs of Nausea or vomiting  Last Vitals:  Filed Vitals:   02/12/13 1415  BP: 131/74  Pulse: 82  Temp:   Resp: 16    Post-op Vital Signs: stable   Complications: No apparent anesthesia complications

## 2013-02-12 NOTE — Interval H&P Note (Signed)
History and Physical Interval Note:  02/12/2013 10:06 AM  Jane Ruiz  has presented today for surgery, with the diagnosis of ENDOMETRIAL CANCER  The various methods of treatment have been discussed with the patient and family. After consideration of risks, benefits and other options for treatment, the patient has consented to  Procedure(s): ROBOTIC ASSISTED TOTAL HYSTERECTOMY WITH BILATERAL SALPINGO OOPHORECTOMY, POSSIBLE LYMPH NODES (N/A) as a surgical intervention .  The patient's history has been reviewed, patient examined, no change in status, stable for surgery.  I have reviewed the patient's chart and labs.  Questions were answered to the patient's satisfaction.     GEHRIG,PAOLA A.

## 2013-02-13 ENCOUNTER — Encounter (HOSPITAL_COMMUNITY): Payer: Self-pay | Admitting: Gynecologic Oncology

## 2013-02-13 LAB — BASIC METABOLIC PANEL
Calcium: 8.8 mg/dL (ref 8.4–10.5)
GFR calc Af Amer: >90 mL/min (ref >90)
GFR calc non Af Amer: >90 mL/min (ref >90)
Glucose, Bld: 145 mg/dL — ABNORMAL HIGH (ref 70–99)
Potassium: 4.2 mEq/L (ref 3.5–5.1)
Sodium: 137 mEq/L (ref 135–145)

## 2013-02-13 LAB — CBC
MCH: 27.9 pg (ref 26.0–34.0)
Platelets: 252 10*3/uL (ref 150–400)
RBC: 4.41 MIL/uL (ref 3.87–5.11)
WBC: 20.7 10*3/uL — ABNORMAL HIGH (ref 4.0–10.5)

## 2013-02-13 MED ORDER — OXYCODONE-ACETAMINOPHEN 5-325 MG PO TABS: 1.0000 | ORAL_TABLET | ORAL | Status: DC | PRN

## 2013-02-13 NOTE — Progress Notes (Signed)
Pt for d/c home today. IV d/c'd. Steristrips x 4 abdominal incision intact & dry. Able to tolerate lunch & able to ambulate on hallway. Pt was able to void in BR since F/C d/c'd this am. Husband at bedside to assist with d/c. Discharge  instructions & RX given with verbalized understanding.

## 2013-02-13 NOTE — Discharge Summary (Signed)
Physician Discharge Summary  Patient ID: Jane Ruiz MRN: [id number] DOB/AGE: 1961-08-19 52 y.o.  Admit date: 02/12/2013 Discharge date: 02/13/2013  Admission Diagnoses: Endometrial cancer  Discharge Diagnoses:  Principal Problem:   Endometrial cancer  Discharged Condition: The patient is in good condition and stable for discharge.  Hospital Course: On 02/12/2013, the patient underwent the following: Procedure(s):  ROBOTIC ASSISTED TOTAL HYSTERECTOMY WITH BILATERAL SALPINGO OOPHORECTOMY,  LYMPH NODES.  The postoperative course was uneventful.  She was discharged to home on postoperative day 1 tolerating a regular diet.  Consults: None  Significant Diagnostic Studies: None  Treatments: surgery: see above  Discharge Exam: Blood pressure 114/75, pulse 87, temperature 98.6 F (37 C), temperature source Oral, resp. rate 18, height 5' 4.84" (1.647 m), weight 178 lb (80.74 kg), SpO2 94.00%. General appearance: alert, cooperative and no distress Resp: clear to auscultation bilaterally Cardio: regular rate and rhythm, S1, S2 normal, no murmur, click, rub or gallop GI: soft, non-tender; bowel sounds normal; no masses,  no organomegaly Extremities: extremities normal, atraumatic, no cyanosis or edema Incision/Wound: Lap sites to the abdomen with steri strips lightly bruised, clean, dry, and intact  Disposition:  Home  Discharge Orders   Future Orders Complete By Expires     Call MD for:  difficulty breathing, headache or visual disturbances  As directed     Call MD for:  extreme fatigue  As directed     Call MD for:  hives  As directed     Call MD for:  persistant dizziness or light-headedness  As directed     Call MD for:  persistant nausea and vomiting  As directed     Call MD for:  redness, tenderness, or signs of infection (pain, swelling, redness, odor or green/yellow discharge around incision site)  As directed     Call MD for:  severe uncontrolled pain  As directed     Call MD  for:  temperature >100.4  As directed     Diet - low sodium heart healthy  As directed     Driving Restrictions  As directed     Comments:      No driving for 2 weeks.  Do not take narcotics and drive.    Increase activity slowly  As directed     Lifting restrictions  As directed     Comments:      No lifting greater than 10 lbs.    Sexual Activity Restrictions  As directed     Comments:      No sexual activity, nothing in the vagina, for 8 weeks.        Medication List    TAKE these medications       ANTACID EXTRA STRENGTH 750 MG chewable tablet  Generic drug:  calcium carbonate  Chew 2 tablets by mouth 2 (two) times daily.     atorvastatin 10 MG tablet  Commonly known as:  LIPITOR  Take 10 mg by mouth every evening.     Fish Oil 1200 MG Caps  Take 1 capsule by mouth daily.     multivitamin tablet  Take 1 tablet by mouth daily.     oxyCODONE-acetaminophen 5-325 MG per tablet  Commonly known as:  PERCOCET/ROXICET  Take 1-2 tablets by mouth every 4 (four) hours as needed (severe pain).           Follow-up Information   Please follow up. (GYN Oncology will call you with your final pathology report and to schedule a  follow up appointment.)       Signed: CROSS, MELISSA DEAL 02/13/2013, 9:30 AM

## 2013-02-21 ENCOUNTER — Encounter: Payer: Self-pay | Admitting: Gynecologic Oncology

## 2013-02-21 ENCOUNTER — Ambulatory Visit: Payer: 59 | Attending: Gynecologic Oncology | Admitting: Gynecologic Oncology

## 2013-02-21 VITALS — BP 138/80 | HR 110 | Temp 98.7°F | Resp 22 | Ht 64.84 in | Wt 171.8 lb

## 2013-02-21 DIAGNOSIS — C549 Malignant neoplasm of corpus uteri, unspecified: Secondary | ICD-10-CM | POA: Insufficient documentation

## 2013-02-21 DIAGNOSIS — N83209 Unspecified ovarian cyst, unspecified side: Secondary | ICD-10-CM | POA: Insufficient documentation

## 2013-02-21 DIAGNOSIS — C541 Malignant neoplasm of endometrium: Secondary | ICD-10-CM

## 2013-02-21 NOTE — Progress Notes (Signed)
Consult Note: Gyn-Onc  CC:  Chief Complaint  Patient presents with  . Endo ca    Follow up    HPI: This is a 52 year old gravida 1 para 1 last normal menstrual period in 2011. [name] notated vaginal bleeding on 11/28/2012. She presented to Dr. Algie Coffer and an endometrial biopsy was collected on 01/06/2013. 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  A vaginal ultrasound collected on 01/16/2013 demonstrates a uterus measuring 6.9 cm in greatest length. The left ovary is noted to have a hyperechoic area or her blood flow which may represent a dermoid cyst or other etiology. A small simple cyst is also noted.  She had continuous spotting since the biopsy she denies any weight loss changes in appetite, there've been no changes in bowel or rectal habits. She denies early satiety and denies bleeding from the bladder or rectum.    She underwent a total robotic hysterectomy bilateral salpingo-oophorectomy bilateral pelvic lymph node dissection on March 11 22,014. Operative findings included physiologic adhesions of the rectosigmoid colon to left pelvic sidewall. Chest small normal-appearing uterus with normal adnexa. Frozen section revealed a grade 1 endometrial adenocarcinoma with minimal myometrial invasion.  However, final pathology revealed: 1. Uterus +/- tubes/ovaries, neoplastic - INVASIVE HIGH GRADE ENDOMETRIAL CARCINOMA, 4.2 CM, CONFINED WITHIN HALF OF THE MYOMETRIUM WITH ANGIOLYMPHATIC INVASION PRESENT. PLEASE SEE ONCOLOGY TEMPLATE FOR DETAILS. - CERVIX: BENIGN SQUAMOUS MUCOSA AND ENDOCERVICAL MUCOSA, NO DYSPLASIA OR MALIGNANCY. - UTERINE SEROSA: ENDOMETRIOSIS AND ADHESIONS, NO EVIDENCE OF ATYPIA OR MALIGNANCY. - BILATERAL OVARIES: BENIGN OVARIAN TISSUE WITH SEROSAL ADHESIONS, NO ATYPIA OR MALIGNANCY. - BILATERAL FALLOPIAN TUBES: BENIGN FALLOPIAN TUBAL TISSUE WITH SEROSAL ENDOMETRIOSIS AND PARATUBAL CYST. NO ATYPIA OR  MALIGNANCY. 2. Lymph nodes, regional resection, right - TWO OF FIVE LYMPH NODES, POSITIVE FOR METASTATIC CARCINOMA (2/5) 3. Lymph nodes, regional resection, left - ONE OF TWO LYMPH NODES, POSITIVE FOR METASTATIC CARCINOMA (1/2).  Microscopic Comment 1. UTERUS Specimen: Uterus, cervix, bilateral ovaries and fallopian tubes. Procedure: Total hysterectomy and bilateral salpingo-oophorectomy. Lymph node sampling performed: Yes. Specimen integrity: Intact. Maximum tumor size (cm): 4.2 cm, gross measurement. Histologic type: Invasive endometrial carcinoma with both serous carcinoma and well differentiated endometrioid carcinoma. Grade: High grade Myometrial invasion: 0.3 cm where myometrium is 1.8 cm in thickness Cervical stromal involvement: Not identified. Extent of involvement of other organs: No. Lymph vascular invasion: Present 1 of 3 FINAL for Jane Ruiz ([id number]) Microscopic Comment(continued) Peritoneal washings: Not performed. Lymph nodes: number examined - 7 ; number positive - 3 TNM code: pT1a, pN1 FIGO Stage (based on pathologic findings, needs clinical correlation): IIIC1 Comments: Grossly, there is a 4.2 cm endometrial mass identified. The tumor is completely submitted for microscopic examination. Sections show a distinct biphasic morphology in which areas of well differentiated endometrioid adenocarcinoma merged with areas of serous carcinoma (20%). Angiolymphatic invasion with serous carcinoma component is present. The tumor is confined within inner half of the myometrium. Immunostains were performed and the endometrioid carcinoma cells are strongly positive for ER and weakly positive for p53 and p16. The serous carcinoma component is strongly positive for p53 and p16, patchy weakly positive for ER. Three of seven pelvic lymph nodes are positive for metastatic serous carcinoma component.  I spoke with her and her husband at some length on March 18 over the phone and they  wanted to come in to review everything today. Patient comes accompanied by her husband as well as her daughter. She's overall doing  quite well. 45 minutes face to face time was spent discussing her pathology, the recommendations for paclitaxel and carboplatin with radiation in "sandwich" fashion. She will undergo 3 cycles of paclitaxel and carboplatin followed by a 2-3 week break followed by radiation therapy then another 2-3 week break followed by an additional 3 cycles of chemotherapy. She's been be scheduled for CT scan of the abdomen and pelvis to evaluate for other evidence of para-aortic or other metastatic disease. She did not undergo a para-aortic lymphadenectomy as frozen section revealed a grade 1 lesion with less than 50% myometrial invasion. They understand this would be important for volume directed radiation therapy. She will have a CA 125 prior cycle #1 of chemotherapy to see that as a marker for her. I did not draw it today as it would be elevated secondary to her postoperative status. Her questions were elicited and answered to their satisfaction. We did provide current information regarding paclitaxel, carboplatin and endometrial cancer. She was also provided a copy of her pathology report. The patient is understandably overwhelmed by all of this and we discussed the psychosocial resources that are available at the cancer hospital.  Current Meds:  Outpatient Encounter Prescriptions as of 02/21/2013  Medication Sig Dispense Refill  . atorvastatin (LIPITOR) 10 MG tablet Take 10 mg by mouth every evening.      . calcium carbonate (ANTACID EXTRA STRENGTH) 750 MG chewable tablet Chew 2 tablets by mouth 2 (two) times daily.       . Multiple Vitamin (MULTIVITAMIN) tablet Take 1 tablet by mouth daily.       . Omega-3 Fatty Acids (FISH OIL) 1200 MG CAPS Take 1 capsule by mouth daily.       Marland Kitchen oxyCODONE-acetaminophen (PERCOCET/ROXICET) 5-325 MG per tablet Take 1-2 tablets by mouth every 4 (four) hours  as needed (severe pain).  60 tablet  0   No facility-administered encounter medications on file as of 02/21/2013.    Allergy: No Known Allergies  Social Hx:   History   Social History  . Marital Status: Married    Spouse Name: N/A    Number of Children: N/A  . Years of Education: N/A   Occupational History  . Not on file.   Social History Main Topics  . Smoking status: Never Smoker   . Smokeless tobacco: Never Used  . Alcohol Use: No  . Drug Use: No  . Sexually Active: Yes   Other Topics Concern  . Not on file   Social History Narrative  . No narrative on file    Past Surgical Hx:  Past Surgical History  Procedure Laterality Date  . Rhinoplasty      breathing problems- Dr Haroldine Laws  . Robotic assisted total hysterectomy with bilateral salpingo oopherectomy N/A 02/12/2013    Procedure: ROBOTIC ASSISTED TOTAL HYSTERECTOMY WITH BILATERAL SALPINGO OOPHORECTOMY,  LYMPH NODES;  Surgeon: Rejeana Brock A. Duard Brady, MD;  Location: [facility];  Service: Gynecology;  Laterality: N/A;    Past Medical Hx:  Past Medical History  Diagnosis Date  . Migraine   . Hyperlipidemia     borderline high cholesterol  . Skin lesion     non maligant skin lesions on head  . Cancer     endometrial    Past Gynecological History:  Gravida 1 para 1.  Menarche occurred at the age of 50 with regular menses until menopause in 2016. She reports an abnormal Pap test either in her 30s or 40s. Her last Pap test was in 2012  all within normal limits. She reports use of oral contraceptive pills for approximately 6 years.   Family Hx:  Family History  Problem Relation Age of Onset  . Diabetes Mother   . Kidney disease Mother   . 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 Please refer to the history of present illness as above. She is going to undergo a CT scan of the chest abdomen and pelvis to complete her staging. She has an appointment  Dr. Arnette Schaumann March 31 for discussion of radiation therapy and a call was placed to Dr. Precious Reel office to schedule her for an appointment for discussion of chemotherapy.   Thompson Caul., MD, PhD 02/21/2013, 4:00 PM

## 2013-02-21 NOTE — Patient Instructions (Signed)
Carboplatin injection  What is this medicine?  CARBOPLATIN (KAR boe pla tin) is a chemotherapy drug. It targets fast dividing cells, like cancer cells, and causes these cells to die. This medicine is used to treat ovarian cancer and many other cancers.  This medicine may be used for other purposes; ask your health care provider or pharmacist if you have questions.  What should I tell my health care provider before I take this medicine?  They need to know if you have any of these conditions:  -blood disorders  -hearing problems  -kidney disease  -recent or ongoing radiation therapy  -an unusual or allergic reaction to carboplatin, cisplatin, other chemotherapy, other medicines, foods, dyes, or preservatives  -pregnant or trying to get pregnant  -breast-feeding  How should I use this medicine?  This drug is usually given as an infusion into a vein. It is administered in a hospital or clinic by a specially trained health care professional.  Talk to your pediatrician regarding the use of this medicine in children. Special care may be needed.  Overdosage: If you think you have taken too much of this medicine contact a poison control center or emergency room at once.  NOTE: This medicine is only for you. Do not share this medicine with others.  What if I miss a dose?  It is important not to miss a dose. Call your doctor or health care professional if you are unable to keep an appointment.  What may interact with this medicine?  -medicines for seizures  -medicines to increase blood counts like filgrastim, pegfilgrastim, sargramostim  -some antibiotics like amikacin, gentamicin, neomycin, streptomycin, tobramycin  -vaccines  Talk to your doctor or health care professional before taking any of these medicines:  -acetaminophen  -aspirin  -ibuprofen  -ketoprofen  -naproxen  This list may not describe all possible interactions. Give your health care provider a list of all the medicines, herbs, non-prescription drugs, or dietary  supplements you use. Also tell them if you smoke, drink alcohol, or use illegal drugs. Some items may interact with your medicine.  What should I watch for while using this medicine?  Your condition will be monitored carefully while you are receiving this medicine. You will need important blood work done while you are taking this medicine.  This drug may make you feel generally unwell. This is not uncommon, as chemotherapy can affect healthy cells as well as cancer cells. Report any side effects. Continue your course of treatment even though you feel ill unless your doctor tells you to stop.  In some cases, you may be given additional medicines to help with side effects. Follow all directions for their use.  Call your doctor or health care professional for advice if you get a fever, chills or sore throat, or other symptoms of a cold or flu. Do not treat yourself. This drug decreases your body's ability to fight infections. Try to avoid being around people who are sick.  This medicine may increase your risk to bruise or bleed. Call your doctor or health care professional if you notice any unusual bleeding.  Be careful brushing and flossing your teeth or using a toothpick because you may get an infection or bleed more easily. If you have any dental work done, tell your dentist you are receiving this medicine.  Avoid taking products that contain aspirin, acetaminophen, ibuprofen, naproxen, or ketoprofen unless instructed by your doctor. These medicines may hide a fever.  Do not become pregnant while taking this medicine.   Do not breast-feed an infant while taking this medicine. What side effects may I notice from receiving this medicine? Side effects that you should report to your  doctor or health care professional as soon as possible: -allergic reactions like skin rash, itching or hives, swelling of the face, lips, or tongue -signs of infection - fever or chills, cough, sore throat, pain or difficulty passing urine -signs of decreased platelets or bleeding - bruising, pinpoint red spots on the skin, black, tarry stools, nosebleeds -signs of decreased red blood cells - unusually weak or tired, fainting spells, lightheadedness -breathing problems -changes in hearing -changes in vision -chest pain -high blood pressure -low blood counts - This drug may decrease the number of white blood cells, red blood cells and platelets. You may be at increased risk for infections and bleeding. -nausea and vomiting -pain, swelling, redness or irritation at the injection site -pain, tingling, numbness in the hands or feet -problems with balance, talking, walking -trouble passing urine or change in the amount of urine Side effects that usually do not require medical attention (report to your doctor or health care professional if they continue or are bothersome): -hair loss -loss of appetite -metallic taste in the mouth or changes in taste This list may not describe all possible side effects. Call your doctor for medical advice about side effects. You may report side effects to FDA at 1-800-FDA-1088. Where should I keep my medicine? This drug is given in a hospital or clinic and will not be stored at home. NOTE: This sheet is a summary. It may not cover all possible information. If you have questions about this medicine, talk to your doctor, pharmacist, or health care provider.  2012, [employer]. (02/26/2008 2:38:05 PM)Paclitaxel injection What is this medicine? PACLITAXEL (PAK li TAX el) is a chemotherapy drug. It targets fast dividing cells, like cancer cells, and causes these cells to die. This medicine is used to treat ovarian cancer, breast cancer, and other  cancers. This medicine may be used for other purposes; ask your health care provider or pharmacist if you have questions. What should I tell my health care provider before I take this medicine? They need to know if you have any of these conditions: -blood disorders -irregular heartbeat -infection (especially a virus infection such as chickenpox, cold sores, or herpes) -liver disease -previous or ongoing radiation therapy -an unusual or allergic reaction to paclitaxel, alcohol, polyoxyethylated castor oil, other chemotherapy agents, other medicines, foods, dyes, or preservatives -pregnant or trying to get pregnant -breast-feeding How should I use this medicine? This drug is given as an infusion into a vein. It is administered in a hospital or clinic by a specially trained health care professional. Talk to your pediatrician regarding the use of this medicine in children. Special care may be needed. Overdosage: If you think you have taken too much of this medicine contact a poison control center or emergency room at once. NOTE: This medicine is only for you. Do not share this medicine with others. What if I miss a dose? It is important not to miss your dose. Call your doctor or health care professional if you are unable to keep an appointment. What may interact with this medicine? Do not take this medicine with any of the following medications: -disulfiram -metronidazole This medicine may also interact with the following medications: -cyclosporine -dexamethasone -diazepam -ketoconazole -medicines to increase blood counts like filgrastim, pegfilgrastim, sargramostim -other chemotherapy drugs like cisplatin, doxorubicin, epirubicin, etoposide, teniposide, vincristine -quinidine -testosterone -vaccines -  verapamil Talk to your doctor or health care professional before taking any of these medicines: -acetaminophen -aspirin -ibuprofen -ketoprofen -naproxen This list may not describe  all possible interactions. Give your health care provider a list of all the medicines, herbs, non-prescription drugs, or dietary supplements you use. Also tell them if you smoke, drink alcohol, or use illegal drugs. Some items may interact with your medicine. What should I watch for while using this medicine? Your condition will be monitored carefully while you are receiving this medicine. You will need important blood work done while you are taking this medicine. This drug may make you feel generally unwell. This is not uncommon, as chemotherapy can affect healthy cells as well as cancer cells. Report any side effects. Continue your course of treatment even though you feel ill unless your doctor tells you to stop. In some cases, you may be given additional medicines to help with side effects. Follow all directions for their use. Call your doctor or health care professional for advice if you get a fever, chills or sore throat, or other symptoms of a cold or flu. Do not treat yourself. This drug decreases your body's ability to fight infections. Try to avoid being around people who are sick. This medicine may increase your risk to bruise or bleed. Call your doctor or health care professional if you notice any unusual bleeding. Be careful brushing and flossing your teeth or using a toothpick because you may get an infection or bleed more easily. If you have any dental work done, tell your dentist you are receiving this medicine. Avoid taking products that contain aspirin, acetaminophen, ibuprofen, naproxen, or ketoprofen unless instructed by your doctor. These medicines may hide a fever. Do not become pregnant while taking this medicine. Women should inform their doctor if they wish to become pregnant or think they might be pregnant. There is a potential for serious side effects to an unborn child. Talk to your health care professional or pharmacist for more information. Do not breast-feed an infant while  taking this medicine. Men are advised not to father a child while receiving this medicine. What side effects may I notice from receiving this medicine? Side effects that you should report to your doctor or health care professional as soon as possible: -allergic reactions like skin rash, itching or hives, swelling of the face, lips, or tongue -low blood counts - This drug may decrease the number of white blood cells, red blood cells and platelets. You may be at increased risk for infections and bleeding. -signs of infection - fever or chills, cough, sore throat, pain or difficulty passing urine -signs of decreased platelets or bleeding - bruising, pinpoint red spots on the skin, black, tarry stools, nosebleeds -signs of decreased red blood cells - unusually weak or tired, fainting spells, lightheadedness -breathing problems -chest pain -high or low blood pressure -mouth sores -nausea and vomiting -pain, swelling, redness or irritation at the injection site -pain, tingling, numbness in the hands or feet -slow or irregular heartbeat -swelling of the ankle, feet, hands Side effects that usually do not require medical attention (report to your doctor or health care professional if they continue or are bothersome): -bone pain -complete hair loss including hair on your head, underarms, pubic hair, eyebrows, and eyelashes -changes in the color of fingernails -diarrhea -loosening of the fingernails -loss of appetite -muscle or joint pain -red flush to skin -sweating This list may not describe all possible side effects. Call your doctor for medical advice  about side effects. You may report side effects to FDA at 1-800-FDA-1088. Where should I keep my medicine? This drug is given in a hospital or clinic and will not be stored at home. NOTE: This sheet is a summary. It may not cover all possible information. If you have questions about this medicine, talk to your doctor, pharmacist, or health care  provider.  2012, [employer]. (11/03/2008 11:54:26 AM)     Paclitaxel injection What is this medicine? PACLITAXEL (PAK li TAX el) is a chemotherapy drug. It targets fast dividing cells, like cancer cells, and causes these cells to die. This medicine is used to treat ovarian cancer, breast cancer, and other cancers. This medicine may be used for other purposes; ask your health care provider or pharmacist if you have questions. What should I tell my health care provider before I take this medicine? They need to know if you have any of these conditions: -blood disorders -irregular heartbeat -infection (especially a virus infection such as chickenpox, cold sores, or herpes) -liver disease -previous or ongoing radiation therapy -an unusual or allergic reaction to paclitaxel, alcohol, polyoxyethylated castor oil, other chemotherapy agents, other medicines, foods, dyes, or preservatives -pregnant or trying to get pregnant -breast-feeding How should I use this medicine? This drug is given as an infusion into a vein. It is administered in a hospital or clinic by a specially trained health care professional. Talk to your pediatrician regarding the use of this medicine in children. Special care may be needed. Overdosage: If you think you have taken too much of this medicine contact a poison control center or emergency room at once. NOTE: This medicine is only for you. Do not share this medicine with others. What if I miss a dose? It is important not to miss your dose. Call your doctor or health care professional if you are unable to keep an appointment. What may interact with this medicine? Do not take this medicine with any of the following medications: -disulfiram -metronidazole This medicine may also interact with the following medications: -cyclosporine -dexamethasone -diazepam -ketoconazole -medicines to increase blood counts like filgrastim, pegfilgrastim,  sargramostim -other chemotherapy drugs like cisplatin, doxorubicin, epirubicin, etoposide, teniposide, vincristine -quinidine -testosterone -vaccines -verapamil Talk to your doctor or health care professional before taking any of these medicines: -acetaminophen -aspirin -ibuprofen -ketoprofen -naproxen This list may not describe all possible interactions. Give your health care provider a list of all the medicines, herbs, non-prescription drugs, or dietary supplements you use. Also tell them if you smoke, drink alcohol, or use illegal drugs. Some items may interact with your medicine. What should I watch for while using this medicine? Your condition will be monitored carefully while you are receiving this medicine. You will need important blood work done while you are taking this medicine. This drug may make you feel generally unwell. This is not uncommon, as chemotherapy can affect healthy cells as well as cancer cells. Report any side effects. Continue your course of treatment even though you feel ill unless your doctor tells you to stop. In some cases, you may be given additional medicines to help with side effects. Follow all directions for their use. Call your doctor or health care professional for advice if you get a fever, chills or sore throat, or other symptoms of a cold or flu. Do not treat yourself. This drug decreases your body's ability to fight infections. Try to avoid being around people who are sick. This medicine may increase your risk to bruise or bleed.  Call your doctor or health care professional if you notice any unusual bleeding. Be careful brushing and flossing your teeth or using a toothpick because you may get an infection or bleed more easily. If you have any dental work done, tell your dentist you are receiving this medicine. Avoid taking products that contain aspirin, acetaminophen, ibuprofen, naproxen, or ketoprofen unless instructed by your doctor. These medicines may  hide a fever. Do not become pregnant while taking this medicine. Women should inform their doctor if they wish to become pregnant or think they might be pregnant. There is a potential for serious side effects to an unborn child. Talk to your health care professional or pharmacist for more information. Do not breast-feed an infant while taking this medicine. Men are advised not to father a child while receiving this medicine. What side effects may I notice from receiving this medicine? Side effects that you should report to your doctor or health care professional as soon as possible: -allergic reactions like skin rash, itching or hives, swelling of the face, lips, or tongue -low blood counts - This drug may decrease the number of white blood cells, red blood cells and platelets. You may be at increased risk for infections and bleeding. -signs of infection - fever or chills, cough, sore throat, pain or difficulty passing urine -signs of decreased platelets or bleeding - bruising, pinpoint red spots on the skin, black, tarry stools, nosebleeds -signs of decreased red blood cells - unusually weak or tired, fainting spells, lightheadedness -breathing problems -chest pain -high or low blood pressure -mouth sores -nausea and vomiting -pain, swelling, redness or irritation at the injection site -pain, tingling, numbness in the hands or feet -slow or irregular heartbeat -swelling of the ankle, feet, hands Side effects that usually do not require medical attention (report to your doctor or health care professional if they continue or are bothersome): -bone pain -complete hair loss including hair on your head, underarms, pubic hair, eyebrows, and eyelashes -changes in the color of fingernails -diarrhea -loosening of the fingernails -loss of appetite -muscle or joint pain -red flush to skin -sweating This list may not describe all possible side effects. Call your doctor for medical advice about side  effects. You may report side effects to FDA at 1-800-FDA-1088. Where should I keep my medicine? This drug is given in a hospital or clinic and will not be stored at home. NOTE: This sheet is a summary. It may not cover all possible information. If you have questions about this medicine, talk to your doctor, pharmacist, or health care provider.  2012, [employer]. (11/03/2008 11:54:26 AM)    Cancer of the Uterus The uterus is part of a woman's reproductive system. It is the hollow, pear-shaped organ where a baby grows. The uterus is in the pelvis between the bladder and the rectum. The narrow, lower portion of the uterus is the cervix. The fallopian tubes extend from either side of the top of the uterus to the ovaries. The wall of the uterus has two layers of tissue. The inner layer, or lining, is the endometrium. The outer layer is muscle tissue called the myometrium. In women of childbearing age, the lining of the uterus grows and thickens each month to prepare for pregnancy. If a woman does not become pregnant, the thick, bloody lining flows out of the body through the vagina. This flow is called menstruation. TYPES OF UTERINE CANCER  The most common type of cancer of the uterus begins in the lining (  endometrium). It is called endometrial cancer, uterine cancer, or cancer of the uterus. It is seen in 2% to 3% of women.  A different type of cancer, uterine sarcoma, develops in the muscle (myometrium). Cancer that begins in the cervix is also a different type of cancer.  Rarely, a noncancerous fibroid tumor of the uterus develops into a sarcoma. CAUSES  No one knows the exact causes of uterine cancer. But it is clear that this disease is not contagious. No one can "catch" cancer from another person. Women who get this disease are more likely than other women to have certain risk factors. A risk factor is something that increases a person's chance of developing the disease.  Most  women who have known risk factors do not get uterine cancer. On the other hand, many who do get this disease have none of these factors. Doctors can seldom explain why one woman gets uterine cancer and another does not.  Studies have found the following risk factors:  Age. Cancer of the uterus occurs mostly in women over age 16.  Endometrial hyperplasia (enlarged endometrium). The risk of uterine cancer is higher if a woman has endometrial hyperplasia.  Hormone replacement therapy (HRT). HRT is used to control the symptoms of menopause, to prevent osteoporosis (thinning of the bones), and to reduce the risk of heart disease or stroke. Women who still have their uterus, and use estrogen without progesterone, have an increased risk of uterine cancer. Long-term use and large doses of estrogen seem to increase this risk. Women who use a combination of estrogen and progesterone have a lower risk of uterine cancer than women who use estrogen alone. The progesterone protects the uterus from developing cancer.  Obesity and related conditions. The body stores and releases some of its estrogen in fatty tissue. That is why obese women are more likely than thin women to have higher levels of estrogen in their bodies. High levels of estrogen may be the reason that obese women have an increased risk of developing uterine cancer. The risk of this disease is also higher in women with diabetes or high blood pressure. These conditions occur in many obese women.  Tamoxifen. Women taking the drug tamoxifen to prevent or treat breast cancer have an increased risk of uterine cancer. This risk appears to be related to the estrogen-like effect of this drug on the uterus.  Race. White women are more likely than African-American women to get uterine cancer.  Colorectal cancer. Women who have had an inherited form of colorectal cancer have a higher risk of developing uterine cancer than other  women.  Infertility.  Beginning menstrual periods before age 69.  Having menstrual periods after age 45.  History of cancer of the ovary or intestine.  Family history of uterine cancer.  Having diabetes, high blood pressure, thyroid or gallbladder disease.  Long-term use of high does of birth control pills. Birth control pills today are low in hormone doses.  Radiation to the abdomen or pelvis.  Smoking. SYMPTOMS  Uterine cancer usually occurs after menopause. But it may also occur around the time that menopause begins. Abnormal vaginal bleeding is the most common symptom of uterine cancer. Bleeding may start as a watery, blood-streaked flow that gradually contains more blood. Women should not assume that abnormal vaginal bleeding is part of menopause. A woman should see her caregiver if she has any of the following symptoms:  Unusual vaginal bleeding or discharge.  Difficult or painful urination.  Pain during intercourse.  Pain in the pelvic area.  Increased girth (growth) of the stomach.  Any vaginal bleeding after menopause.  Unexplained weight loss. These symptoms can be caused by cancer or other less serious conditions. Most often they are not cancer. But a thorough evaluation is needed to be certain. DIAGNOSIS  If a woman has symptoms that suggest uterine cancer, her caregiver may check her general health and may order blood and urine tests. The caregiver also may perform one or more of these exams or tests.  Blood and urine tests and chest x-rays. The woman also may have:  Other X-rays.  CT scans.  Ultrasound test.  Magnetic resonance imaging (MRI).  Sigmoidoscopy.  Colonoscopy.  Pelvic exam. A woman will have a pelvic exam to check the vagina, uterus, bladder, and rectum. The caregiver feels these organs for any lumps or changes in their shape or size. To see the upper part of the vagina and the cervix, the caregiver inserts an instrument called a  speculum into the vagina.  Pap test. The caregiver collects cells from the cervix and upper vagina. A medical laboratory checks for abnormal cells. The Pap test is better for detecting cancer of the cervix. But cells from inside the uterus usually do not show up on a Pap test. It is not a reliable test for uterine cancer.  Transvaginal ultrasound. The medical caregiver inserts an instrument into the vagina. The instrument aims high-frequency sound waves at the uterus. The pattern of the echoes they produce creates a picture. If the endometrium looks too thick, the caregiver can do a biopsy.  Biopsy. The medical caregiver removes a sample of tissue from the uterine lining. This usually can be done in the caregiver's office.  Dilatation and Curettage (D&C). In some cases, a woman may need to have a D&C. D&C is usually done as same-day surgery with anesthesia in a hospital. A pathologist examines the tissue (lining of the uterus) to check for cancer cells and other conditions. STAGING   If uterine cancer is diagnosed, the caregiver needs to know the stage, or extent, of the disease to plan the best treatment. Staging is a careful attempt to find out whether the cancer has spread, and if so, to what parts of the body.  When uterine cancer spreads (metastasizes) outside the uterus, cancer cells are often found in nearby lymph nodes, nerves, or blood vessels. If the cancer has reached the lymph nodes, cancer cells may have spread to other lymph nodes and other organs of the body.  Staging is done at the time of surgery. In most cases, the most reliable way to stage this disease is to remove the uterus, cervix, tubes, ovaries, and lymph nodes. A pathologist uses a microscope to examine the uterus and other tissues removed by the surgeon, to determine the extent of the cancer in the pelvis.  If lymph nodes have cancer cells, other parts of the body are examined, to see if it has spread to other  organs. MAIN FEATURES OF EACH STAGE OF THE DISEASE: Stage I. The cancer is only in the body of the uterus. It is not in the cervix. Stage II. The cancer has spread from the body of the uterus to the cervix. Stage III. The cancer has spread outside the uterus, but not outside the pelvis (and not to the bladder or rectum). Lymph nodes in the pelvis may contain cancer cells. Stage IV. The cancer has spread into the bladder or rectum. It may have spread beyond  the pelvis to other body parts. TREATMENT  Women with uterine cancer have many treatment options. Most women with uterine cancer are treated with surgery. Some have radiation or chemotherapy. A smaller number of women may be treated with hormonal therapy. Some patients receive a combination of therapies. You may want to consult with another cancer doctor for a second opinion. The caregiver (usually a cancer doctor) is the best person to describe your treatment choices and to discuss the expected results of treatment. SURGERY  Most women with uterine cancer have surgery to remove the uterus, cervix, tubes, and ovaries (total hysterectomy). This is usually done through an incision in the abdomen.  The doctor may also remove the lymph nodes near the tumor, to see if they contain cancer. If cancer cells have reached the lymph nodes, it may mean that the disease has spread to other parts of the body. If cancer cells have not spread beyond the endometrium, the woman may not need to have any other treatment. The length of the hospital stay may vary from several days to a week. RADIATION THERAPY  In radiation therapy, high-energy rays are used to kill cancer cells. Like surgery, radiation therapy is a local therapy. It affects cancer cells only in the treated area.  Some women with Stage I, II, or III uterine cancer need both radiation therapy and surgery. They may have radiation before surgery to shrink the tumor, or after surgery to destroy any cancer  cells that remain in the area. The doctor may suggest radiation treatments for the small number of women who cannot have surgery.  Doctors use two types of radiation therapy to treat uterine cancer:  External radiation. In external radiation therapy, a large machine outside the body is used to aim radiation at the tumor area. The woman usually does not stay overnight (outpatient) at the hospital or clinic, and receives external radiation 5 days a week for several weeks. This schedule helps protect healthy cells and tissue by spreading out the total dose of radiation. No radioactive materials are put into the body for external radiation therapy.  Internal radiation. In internal radiation therapy, tiny tubes containing a radioactive substance are inserted through the vagina and cervix, into the uterus, and left in place for a few days. The woman stays in the hospital during this treatment. To protect others from radiation exposure, the patient may not be able to have visitors or may have visitors only for a short period of time while the implant is in place. Once the implant is removed, the woman has no radioactivity in her body.  Some patients need both external and internal radiation therapies. CHEMOTHERAPY Chemotherapy is not usually used for endometrial cancer of the uterus. However, with sarcoma of the uterus or of the fibroid, it may be used in combination with surgery. Chemotherapy may also be used with recurring sarcoma, and in patients who cannot have surgery. HORMONE THERAPY Hormonal therapy involves substances that prevent cancer cells from multiplying or growing by attaching to hormone receptors. This causes changes in cancer cells. Before therapy begins, the caregiver may request a hormone receptor test. This special lab test of uterine tissue helps the caregiver learn if estrogen and progesterone receptors are present. If the tissue has receptors, the woman is more likely to respond to  hormonal therapy.  Hormonal therapy is called a systemic therapy, because it can affect cancer cells throughout the body. Usually, hormonal therapy is a type of progesterone, taken as a pill or  injection.  The doctor may use hormonal therapy for women with uterine cancer who are unable to have surgery or radiation therapy. Also, the doctor may give hormonal therapy to women with uterine cancer that has spread to the lungs or other distant sites. It is also given to women with uterine cancer that has come back.  Hormonal therapy can cause a number of side effects. Women taking progesterone may retain fluid, have an increased appetite, and gain weight. Women who are still menstruating may have changes in their periods.  Hormone therapy can be used in combination with surgery or radiation. HOME CARE INSTRUCTIONS   Maintain a normal weight with a healthy balanced diet and exercise.  If you have diabetes, high blood pressure, thyroid or gallbladder disease, keep them in control with your caregiver's treatment and recommendations.  Do not smoke.  Do not take estrogen without taking progesterone with it, for menopausal symptoms.  Join a support group or get counseling, if you would like help dealing with your cancer.  If you are on hormone replacement therapy, see your caregiver as recommended, and be informed about the side effects of HRT.  Women with known risk factors should ask their caregiver what symptoms to look for and how often they should have an examination.  Keep your follow-up appointments and take your medicines as advised.  Write your questions down, and take them with you to your caregiver's appointments.  You may want another person to be with you for your appointments, so you do not miss any instructions. SEEK MEDICAL CARE IF:   You have any abnormal vaginal bleeding.  You are having menstrual periods at the age of 75 or older.  You have bleeding after sexual  intercourse.  You are taking tomoxifen and develop vaginal bleeding.  Your stomach is growing, and you are not pregnant.  You have pain with sexual intercourse.  You have stomach or pelvis pain.  You have weight loss for no known reason.  You have pain or difficulty with urination. [facility] BOOKLETS  Cancer Information Service (CIS) provides accurate, up-to-date information on cancer to patients and their families, health professionals, and the general public:  Phone: 1-800-4-CANCER (216-366-6153).  Internet: http://www.cancer.gov NCI's website contains complete information about cancer causes and prevention, screening and diagnosis, treatment and survivorship, clinical trials, statistics, funding, training, and employment opportunities, and [employer] and its programs. CLINICAL TRIALS A woman who is interested in being part of a clinical trial should talk with her caregiver. NCI's website (http://www.johnson-fowler.biz/) provides general information about clinical trials. It also offers detailed information about specific ongoing studies of uterine cancer by linking to PDQ, a cancer information database developed by the NCI. The Cancer Information Service at 1-800-4-CANCER can answer questions about cancer and provide information from the PDQ database. Document Released: 11/21/2005 Document Revised: 02/13/2012 Document Reviewed: 09/24/2009 [facility] Patient Information 2013 [place], [place].

## 2013-02-25 ENCOUNTER — Telehealth: Payer: Self-pay | Admitting: Oncology

## 2013-02-25 ENCOUNTER — Ambulatory Visit (HOSPITAL_COMMUNITY)
Admission: RE | Admit: 2013-02-25 | Discharge: 2013-02-25 | Disposition: A | Discharge status: Home / Self Care | Payer: 59 | Source: Ambulatory Visit | Attending: Gynecologic Oncology | Admitting: Gynecologic Oncology

## 2013-02-25 ENCOUNTER — Other Ambulatory Visit: Payer: Self-pay | Admitting: Family Medicine

## 2013-02-25 ENCOUNTER — Encounter (HOSPITAL_COMMUNITY): Payer: Self-pay

## 2013-02-25 DIAGNOSIS — C541 Malignant neoplasm of endometrium: Secondary | ICD-10-CM

## 2013-02-25 DIAGNOSIS — C549 Malignant neoplasm of corpus uteri, unspecified: Secondary | ICD-10-CM | POA: Insufficient documentation

## 2013-02-25 DIAGNOSIS — Z90710 Acquired absence of both cervix and uterus: Secondary | ICD-10-CM | POA: Insufficient documentation

## 2013-02-25 MED ORDER — IOHEXOL 300 MG/ML  SOLN
100.0000 mL | Freq: Once | INTRAMUSCULAR | Status: AC | PRN
  Administered 2013-02-25: 100 mL via INTRAVENOUS

## 2013-02-25 MED ORDER — ATORVASTATIN CALCIUM 10 MG PO TABS: 10.0000 mg | ORAL_TABLET | Freq: Every evening | ORAL | Status: DC

## 2013-02-25 NOTE — Telephone Encounter (Signed)
C/D 02/25/13 for appt. 03/12/13

## 2013-02-26 ENCOUNTER — Telehealth: Payer: Self-pay | Admitting: Oncology

## 2013-02-26 NOTE — Telephone Encounter (Signed)
PT CALLED IN RE NP APPT 04/01 @ 9:30 [facility], 04/08 @ 2:45 DR. LIVESAY WELCOME PACKET MAILED

## 2013-03-04 ENCOUNTER — Encounter: Payer: Self-pay | Admitting: Radiation Oncology

## 2013-03-04 ENCOUNTER — Ambulatory Visit
Admission: RE | Admit: 2013-03-04 | Discharge: 2013-03-04 | Disposition: A | Discharge status: Home / Self Care | Payer: 59 | Source: Ambulatory Visit | Attending: Radiation Oncology | Admitting: Radiation Oncology

## 2013-03-04 VITALS — BP 146/83 | HR 80 | Temp 97.7°F | Resp 20 | Wt 179.2 lb

## 2013-03-04 DIAGNOSIS — Z90710 Acquired absence of both cervix and uterus: Secondary | ICD-10-CM | POA: Insufficient documentation

## 2013-03-04 DIAGNOSIS — E785 Hyperlipidemia, unspecified: Secondary | ICD-10-CM | POA: Insufficient documentation

## 2013-03-04 DIAGNOSIS — C549 Malignant neoplasm of corpus uteri, unspecified: Secondary | ICD-10-CM | POA: Insufficient documentation

## 2013-03-04 DIAGNOSIS — C541 Malignant neoplasm of endometrium: Secondary | ICD-10-CM

## 2013-03-04 DIAGNOSIS — C801 Malignant (primary) neoplasm, unspecified: Secondary | ICD-10-CM | POA: Insufficient documentation

## 2013-03-04 DIAGNOSIS — Z9079 Acquired absence of other genital organ(s): Secondary | ICD-10-CM | POA: Insufficient documentation

## 2013-03-04 DIAGNOSIS — Z79899 Other long term (current) drug therapy: Secondary | ICD-10-CM | POA: Insufficient documentation

## 2013-03-04 NOTE — Progress Notes (Signed)
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 female who is presented with postmenopausal vaginal bleeding in late December of 2013.  She was seen by Dr. Algie Coffer and endometrial biopsy was performed which revealed endometrioid adenocarcinoma with villoglandular mucous features. On vaginal ultrasound the uterus measured 6.9 cm in greatest dimension.  The patient was seen by Dr. Duard Brady and on 02/12/2013 she underwent a total robotic hysterectomy bilateral salpingo-oophorectomy bilateral pelvic lymph node dissection.  Operative findings included physiologic adhesions of the rectosigmoid colon to left pelvic sidewall.  Small normal-appearing uterus with normal adnexa. Frozen section revealed a grade 1 endometrial adenocarcinoma with minimal myometrial invasion,  however on the final pathologic review the patient was found to have an invasive high-grade endometrial carcinoma with 20% serous component. Tumor was confined to the inner half of the myometrium. There was angiolymphatic invasion present.  3/7 pelvic lymph nodes showed metastasis.  A periaortic node dissection was not performed in light of the initial frozen section showing a grade 1 endometrial adenocarcinoma with minimal myometrial invasion. With the above pathologic findings the patient is now seen to be considered for adjuvant treatment.  PREVIOUS RADIATION THERAPY: No  PAST MEDICAL HISTORY:  has a past medical history of Migraine; Hyperlipidemia; Skin lesion; and Cancer (02/12/13).    PAST SURGICAL HISTORY: Past Surgical History  Procedure Laterality Date  . Rhinoplasty      breathing problems- Dr  Haroldine Laws  . Robotic assisted total hysterectomy with bilateral salpingo oopherectomy N/A 02/12/2013    Procedure: ROBOTIC ASSISTED TOTAL HYSTERECTOMY WITH BILATERAL SALPINGO OOPHORECTOMY,  LYMPH NODES;  Surgeon: Rejeana Brock A. Duard Brady, MD;  Location: [facility];  Service: Gynecology;  Laterality: N/A;  . Abdominal hysterectomy      FAMILY HISTORY: family history includes Benign prostatic hyperplasia in her father; Diabetes in her maternal grandmother and mother; and Kidney disease in her mother.  SOCIAL HISTORY:  reports that she has never smoked. She has never used smokeless tobacco. She reports that she does not drink alcohol or use illicit drugs.  ALLERGIES: Review of patient's allergies indicates no known allergies.  MEDICATIONS:  Current Outpatient Prescriptions  Medication Sig Dispense Refill  . atorvastatin (LIPITOR) 10 MG tablet Take 1 tablet (10 mg total) by mouth every evening.  90 tablet  1  . calcium carbonate (ANTACID EXTRA STRENGTH) 750 MG chewable tablet Chew 2 tablets by mouth 2 (two) times daily.       . Multiple Vitamin (MULTIVITAMIN) tablet Take 1 tablet by mouth daily.       . Omega-3 Fatty Acids (FISH OIL) 1200 MG CAPS Take 1 capsule by mouth daily.        No current facility-administered medications for this encounter.    REVIEW OF SYSTEMS:  A 15 point review of systems is documented in the electronic medical record. This was obtained by the nursing staff. However, I reviewed this with the patient to discuss relevant findings and make appropriate changes.  She has minimal soreness in her scars along the abdominal region. She denies any pelvic pain vaginal discharge or bleeding. She denies any urination symptoms or bowel complaints. She denies any cough or breathing problems. Patient  denies any headaches dizziness or blurred vision.   PHYSICAL EXAM:  weight is 179 lb 3.2 oz (81.285 kg). Her oral temperature is 97.7 F (36.5 C). Her blood pressure is 146/83 and her pulse is 80. Her  respiration is 20.   BP 146/83  Pulse 80  Temp(Src) 97.7 F (36.5 C) (Oral)  Resp 20  Wt 179 lb 3.2 oz (81.285 kg)  BMI 29.97 kg/m2  General Appearance:    Alert, cooperative, no distress, appears stated age  Head:    Normocephalic, without obvious abnormality, atraumatic  Eyes:    PERRL, conjunctiva/corneas clear, EOM's intact,     Ears:    Normal TM's and external ear canals, both ears  Nose:   Nares normal, septum midline, mucosa normal, no drainage    or sinus tenderness  Throat:   Lips, mucosa, and tongue normal; teeth and gums normal  Neck:   Supple, symmetrical, trachea midline, no adenopathy;    thyroid:  no enlargement/tenderness/nodules; no carotid   bruit or JVD  Back:     Symmetric, no curvature, ROM normal, no CVA tenderness  Lungs:     Clear to auscultation bilaterally, respirations unlabored  Chest Wall:    No tenderness or deformity   Heart:    Regular rate and rhythm,  no murmur, rub   or gallop     Abdomen:     Soft, non-tender, bowel sounds active all four quadrants,    no masses, no organomegaly, small scars present from her laparoscopic procedure. There's no signs of drainage or infection   Genitalia:   deferred until simulation and planning day   Rectal:   deferred until simulation and planning day   Extremities:   Extremities normal, atraumatic, no cyanosis or edema  Pulses:   2+ and symmetric all extremities  Skin:   Skin color, texture, turgor normal, no rashes or lesions  Lymph nodes:   Cervical, supraclavicular, and axillary nodes normal, no inguinal adenopathy   Neurologic:   normal strength, sensation and reflexes    throughout    LABORATORY DATA:  Lab Results  Component Value Date   WBC 20.7* 02/13/2013   HGB 12.3 02/13/2013   HCT 37.2 02/13/2013   MCV 84.4 02/13/2013   PLT 252 02/13/2013   Lab Results  Component Value Date   NA 137 02/13/2013   K 4.2 02/13/2013   CL 104 02/13/2013   CO2 24 02/13/2013   Lab Results  Component Value Date   ALT  13 02/08/2013   AST 13 02/08/2013   ALKPHOS 79 02/08/2013   BILITOT 0.2* 02/08/2013     RADIOGRAPHY: Chest 2 View  02/08/2013  *RADIOLOGY REPORT*  Clinical Data: Endometrial cancer, preoperative assessment  CHEST - 2 VIEW  Comparison: None  Findings: Upper-normal size of cardiac silhouette. Mediastinal contours and pulmonary vascularity normal. Lungs clear. No pulmonary infiltrate, pleural effusion or pneumothorax. Bones unremarkable.  IMPRESSION: No acute abnormalities.   Original Report Authenticated By: Ulyses Southward, M.D.    Ct Chest W Contrast  02/25/2013  *RADIOLOGY REPORT*  Clinical Data:  Advanced endometrial carcinoma.  Initial diagnosis March 2014.  Total hysterectomy 2 weeks prior.  CT CHEST, ABDOMEN AND PELVIS WITH CONTRAST  Technique:  Multidetector CT imaging of the chest, abdomen and pelvis was performed following the standard protocol during bolus administration of intravenous contrast.  Contrast: OMNIPAQUE IOHEXOL 300 MG/ML  SOLN  Comparison:   None.  CT CHEST  Findings:  No axillary or supraclavicular lymphadenopathy.  No mediastinal or hilar lymphadenopathy.  No pericardial fluid. Hiatal hernia is present.  Upper esophagus is normal.  Review of the lung parenchyma demonstrates no suspicious nodules.  IMPRESSION: No evidence of thoracic metastasis.  CT ABDOMEN AND PELVIS  Findings:  No focal hepatic lesion.  The gallbladder, pancreas, spleen, adrenal glands, and kidneys are normal.  The stomach, small bowel, appendix, and cecum are normal.  The colon and rectosigmoid colon are normal.  Delayed pyelogram phase imaging demonstrates normal collecting systems or proximal ureters. Delayed images through the pelvis demonstrate normal distal ureters.  Within the pelvis there is mild stranding consistent with recent abdominal hysterectomy.  Small amount fluid along the left external iliac vessels which is low attenuation consistent with postsurgical fluid.  The bladder is normal.  The vaginal cuff is  normal.  There is no evidence of pelvic lymphadenopathy.  No retroperitoneal adenopathy.  No mesenteric disease.  Review of  bone windows demonstrates no aggressive osseous lesions.  IMPRESSION:  1.  No evidence metastasis within the abdomen or pelvis.  2.  No evidence of adenopathy in the abdomen or pelvis. 3.  Postsurgical changes in the pelvis consistent with recent abdominal hysterectomy. 4.  Delayed images through the pelvis demonstrate normal ureters.   Original Report Authenticated By: Genevive Bi, M.D.    Ct Abdomen Pelvis W Contrast  02/25/2013  *RADIOLOGY REPORT*  Clinical Data:  Advanced endometrial carcinoma.  Initial diagnosis March 2014.  Total hysterectomy 2 weeks prior.  CT CHEST, ABDOMEN AND PELVIS WITH CONTRAST  Technique:  Multidetector CT imaging of the chest, abdomen and pelvis was performed following the standard protocol during bolus administration of intravenous contrast.  Contrast: OMNIPAQUE IOHEXOL 300 MG/ML  SOLN  Comparison:   None.  CT CHEST  Findings:  No axillary or supraclavicular lymphadenopathy.  No mediastinal or hilar lymphadenopathy.  No pericardial fluid. Hiatal hernia is present.  Upper esophagus is normal.  Review of the lung parenchyma demonstrates no suspicious nodules.  IMPRESSION: No evidence of thoracic metastasis.  CT ABDOMEN AND PELVIS  Findings:  No focal hepatic lesion.  The gallbladder, pancreas, spleen, adrenal glands, and kidneys are normal.  The stomach, small bowel, appendix, and cecum are normal.  The colon and rectosigmoid colon are normal.  Delayed pyelogram phase imaging demonstrates normal collecting systems or proximal ureters. Delayed images through the pelvis demonstrate normal distal ureters.  Within the pelvis there is mild stranding consistent with recent abdominal hysterectomy.  Small amount fluid along the left external iliac vessels which is low attenuation consistent with postsurgical fluid.  The bladder is normal.  The vaginal cuff is  normal.  There is no evidence of pelvic lymphadenopathy.  No retroperitoneal adenopathy.  No mesenteric disease.  Review of  bone windows demonstrates no aggressive osseous lesions.  IMPRESSION:  1.  No evidence metastasis within the abdomen or pelvis.  2.  No evidence of adenopathy in the abdomen or pelvis. 3.  Postsurgical changes in the pelvis consistent with recent abdominal hysterectomy. 4.  Delayed images through the pelvis demonstrate normal ureters.   Original Report Authenticated By: Genevive Bi, M.D.       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  The patient would be at risk for pelvic recurrence and I would recommend radiation therapy as part of her overall management.  In addition with the presence of angiolymphatic invasion and serous component,  I would recommend vaginal vault brachytherapy as part of her management. Her external beam radiation treatment  will be with intensity modulated radiation therapy (IMRT) to reduce dose to the small bowel and pelvic bone marrow. She will undergo 3 cycles of paclitaxel and carboplatin followed by a 2-3 week break followed by radiation therapy then another 2-3 week break followed by an additional 3 cycles of chemotherapy. The patient could receive her vaginal vault brachytherapy during her last few cycles of chemotherapy.   PLAN: The patient will be seen in medical oncology next week by Dr. Jama Flavors. She will proceed with chemotherapy initially as above.  I spent 60 minutes minutes face to face with the patient and more than 50% of that time was spent in counseling and/or coordination of care.   ------------------------------------------------  -----------------------------------  Billie Lade, PhD, MD

## 2013-03-04 NOTE — Progress Notes (Signed)
Please see the Nurse Progress Note in the MD Initial Consult Encounter for this patient. 

## 2013-03-04 NOTE — Progress Notes (Signed)
Pt is married, has 1 daughter who lives in [place]. Her niece is w/her today. 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 Pt denies pain, loss of appetite, vaginal discharge, bowel or bladder issues. She is somewhat fatigued since surgery but states "it is getting better".

## 2013-03-05 ENCOUNTER — Other Ambulatory Visit: Payer: 59

## 2013-03-05 ENCOUNTER — Encounter: Payer: Self-pay | Admitting: *Deleted

## 2013-03-05 ENCOUNTER — Encounter: Payer: Self-pay | Admitting: Oncology

## 2013-03-05 ENCOUNTER — Ambulatory Visit: Payer: 59

## 2013-03-05 NOTE — Progress Notes (Signed)
New patient came in. All information was verified. I gave her an application for assistance. She said she already has a bill for hosp and dr, and she is making pmts on it.

## 2013-03-11 ENCOUNTER — Other Ambulatory Visit: Payer: Self-pay | Admitting: Oncology

## 2013-03-11 DIAGNOSIS — C541 Malignant neoplasm of endometrium: Secondary | ICD-10-CM

## 2013-03-12 ENCOUNTER — Ambulatory Visit (HOSPITAL_BASED_OUTPATIENT_CLINIC_OR_DEPARTMENT_OTHER): Payer: 59 | Admitting: Oncology

## 2013-03-12 ENCOUNTER — Encounter: Payer: Self-pay | Admitting: Oncology

## 2013-03-12 ENCOUNTER — Telehealth: Payer: Self-pay | Admitting: Oncology

## 2013-03-12 ENCOUNTER — Other Ambulatory Visit (HOSPITAL_BASED_OUTPATIENT_CLINIC_OR_DEPARTMENT_OTHER): Payer: 59 | Admitting: Lab

## 2013-03-12 VITALS — BP 161/90 | HR 80 | Temp 98.4°F | Resp 18 | Ht 64.84 in | Wt 181.1 lb

## 2013-03-12 DIAGNOSIS — C549 Malignant neoplasm of corpus uteri, unspecified: Secondary | ICD-10-CM

## 2013-03-12 DIAGNOSIS — C541 Malignant neoplasm of endometrium: Secondary | ICD-10-CM

## 2013-03-12 LAB — CBC WITH DIFFERENTIAL/PLATELET
Basophils Absolute: 0 10*3/uL (ref 0.0–0.1)
EOS%: 2.1 % (ref 0.0–7.0)
Eosinophils Absolute: 0.2 10*3/uL (ref 0.0–0.5)
LYMPH%: 24.1 % (ref 14.0–49.7)
MCH: 28 pg (ref 25.1–34.0)
MCV: 85.6 fL (ref 79.5–101.0)
MONO%: 9.1 % (ref 0.0–14.0)
NEUT#: 6 10*3/uL (ref 1.5–6.5)
Platelets: 273 10*3/uL (ref 145–400)
RBC: 4.5 10*6/uL (ref 3.70–5.45)
nRBC: 0 % (ref 0–0)

## 2013-03-12 LAB — COMPREHENSIVE METABOLIC PANEL
CO2: 28 mEq/L (ref 19–32)
Creatinine, Ser: 0.74 mg/dL (ref 0.50–1.10)
Glucose, Bld: 96 mg/dL (ref 70–99)
Total Bilirubin: 0.2 mg/dL — ABNORMAL LOW (ref 0.3–1.2)

## 2013-03-12 LAB — CA 125: CA 125: 8.8 U/mL (ref 0.0–30.2)

## 2013-03-12 MED ORDER — DEXAMETHASONE 4 MG PO TABS: ORAL_TABLET | ORAL | Status: DC

## 2013-03-12 MED ORDER — ONDANSETRON HCL 8 MG PO TABS: 8.0000 mg | ORAL_TABLET | Freq: Three times a day (TID) | ORAL | Status: DC | PRN

## 2013-03-12 MED ORDER — LORAZEPAM 1 MG PO TABS: ORAL_TABLET | ORAL | Status: DC

## 2013-03-12 NOTE — Telephone Encounter (Signed)
Gave pt appt for lab and MD for APril 2014 and chemo

## 2013-03-12 NOTE — Patient Instructions (Addendum)
Your chemo will be at 9:30 AM on Friday 03-15-13. You will need to take decadron (dexamethasone, steroid), five of the 4 mg tablets (=20mg ) 12 hours and again 6 hours before the chemo, each dose with some food. SO, you will take five tablets with food at 9:30 PM on Thursday 4-10 and and another five tablets with food at 3:30 AM on Friday 4-11. The night of chemo (Friday night 4-11) at bedtime, take ativan (lorazepam) at bedtime, whether or not any nausea. On Saturday AM 4-12, whether or not any nausea, take zofran (ondansetron). Other than those 2 doses of nausea medicines, you can take these according to directions on bottles as needed if any nausea.  Ativan (lorazepam) 1 mg:  1/2 - 1 tablet under tongue or swallow every 4-6 hours as needed for nausea. This one will make you sleepy and relaxed. Ok to try it at bedtime this week even before chemo. Zofran (ondansetron) 8 mg   1-2 every 12 hours as needed for nausea.   This will not make you sleepy.   Call any time if questions or concerns:  [phone]

## 2013-03-12 NOTE — Progress Notes (Signed)
[facility] NEW PATIENT EVALUATION   Name: ISSIS LINDSETH Date: 03/12/2013 MRN: [id number] DOB: 18-Feb-1961  REFERRING PHYSICIAN: P.Gehrig  Other Physicians: J.Kinard, Tower, Audrie Gallus, MD (PCP), K.Fogleman, A.Jordan, M.Magod, [facility]    REASON FOR REFERRAL:  IIIC serous endometrial carcinoma, for adjuvant treatment with chemotherapy. Recommendation is for six cycles of taxol/carboplatin given in sandwich fashion with RT.    HISTORY OF PRESENT ILLNESS:Jocie Kirtland Bouchard Reddin is a 52 y.o. female who is seen, together with sister, on referral from Dr Duard Brady, for consideration of chemotherapy for her recently diagnosed endometrial cancer.  Patient had been in usual very good health until vaginal bleeding beginning 11-28-12. She was seen in January by PCP Dr Milinda Antis, who had done her gyn exams previously, and sent to Dr Kirtland Bouchard.Fogleman. 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 She saw Dr Nelly Rout on 02-21-13 and was taken to total robotic hysterectomy/BSO/bilateral pelvic nodes, with frozen section showing grade 1 tumor with minimal myometrial involvement, such that paraaortic nodes were not evaluated. She was hospitalized just 1.5 days and required no pain medication after DC home. Final pathology ([id number]), however, showed invasive with a well differentiated component and a  high grade serous component.   total size was 4.2 cm and invasion 0.3 cm where myometrium 1.8 cm thick; the serous component had LVSI and 3 of 7 pelvic nodes were involved with the serous component. She saw Dr Duard Brady postoperatively on 02-21-13, recovering well from the surgery. She had CT CAP on 02-25-13 with no findings of concern in the chest and no retroperitoneal adenopathy or other findings of concern for metastatic disease. She has seen Dr Roselind Messier in consultation, with recommendation for IMRT after 3  cycles of chemotherapy and vaginal vault brachytherapy during the final 3 cycles of chemotherapy. Patient and husband attended chemotherapy education class.   REVIEW OF SYSTEMS: occasional migraine headaches x years, very infrequently now. Reading glasses. No significant allergy or sinus symptoms. No hearing problems. No dental problems. No thyroid symptoms. No respiratory symptoms. She had unremarkable mammograms at [facility] in Jan 2014. Weight usually ~ 182. Appetite ok, no GERD. No cardiac symptoms, no arthritis, no hx blood clots. Was not on lovenox after surgery. Some difficulty sleeping. IV access not difficult thus far. Emotional since this diagnosis. Bowels are moving regularly, no bladder symptoms. No swelling LE. No pain.  ALLERGIES: Review of patient's allergies indicates no known allergies.  PAST MEDICAL HISTORY:  has a past medical history of Migraine; Hyperlipidemia; Skin lesion; and Cancer (02/12/13).   G1P1 LMP 2011 Rhinoplasty by Dr Haroldine Laws Elevated lipids Plantar fasciitis, treated at [facility] Colonoscopy by Dr Ewing Schlein in past couple of years "tiny polyps" next in 10 years.  CURRENT MEDICATIONS:reviewed as listed in EMR. She did have zofran 4 mg x 2 doses post operatively (does not recall any HA in hospital) Pharmacy is [facility]  SOCIAL HISTORY:  reports that she has never smoked. She has never used smokeless tobacco. She reports that she does not drink alcohol or use illicit drugs. Originally from [place], lives with husband in [place]. Patient previously worked at [employer] and now is Diplomatic Services operational officer for [employer] that does not presently have a Education officer, environmental. 1 daughter age 1, granddaughter age 73. Husband works for [employer]. Sister lives in [place].  FAMILY HISTORY: family history includes Benign prostatic hyperplasia in her father; Diabetes in her maternal grandmother and mother; and Kidney  disease in her mother. 1 brother and 1 sister healthy, daughter and grandchild  healthy   LABORATORY DATA:  Results for orders placed in visit on 03/12/13 (from the past 48 hour(s))  CBC WITH DIFFERENTIAL     Status: None   Collection Time    03/12/13  2:30 PM      Result Value Range   WBC 9.4  3.9 - 10.3 10e3/uL   NEUT# 6.0  1.5 - 6.5 10e3/uL   HGB 12.6  11.6 - 15.9 g/dL   HCT 40.9  81.1 - 91.4 %   Platelets 273  145 - 400 10e3/uL   MCV 85.6  79.5 - 101.0 fL   MCH 28.0  25.1 - 34.0 pg   MCHC 32.7  31.5 - 36.0 g/dL   RBC 7.82  9.56 - 2.13 10e6/uL   RDW 14.2  11.2 - 14.5 %   lymph# 2.3  0.9 - 3.3 10e3/uL   MONO# 0.9  0.1 - 0.9 10e3/uL   Eosinophils Absolute 0.2  0.0 - 0.5 10e3/uL   Basophils Absolute 0.0  0.0 - 0.1 10e3/uL   NEUT% 64.5  38.4 - 76.8 %   LYMPH% 24.1  14.0 - 49.7 %   MONO% 9.1  0.0 - 14.0 %   EOS% 2.1  0.0 - 7.0 %   BASO% 0.2  0.0 - 2.0 %   nRBC 0  0 - 0 %  COMPREHENSIVE METABOLIC PANEL     Status: Abnormal   Collection Time    03/12/13  2:30 PM      Result Value Range   Sodium 140  135 - 145 mEq/L   Potassium 4.0  3.5 - 5.3 mEq/L   Chloride 104  96 - 112 mEq/L   CO2 28  19 - 32 mEq/L   Glucose, Bld 96  70 - 99 mg/dL   BUN 9  6 - 23 mg/dL   Creatinine, Ser 0.86  0.50 - 1.10 mg/dL   Total Bilirubin 0.2 (*) 0.3 - 1.2 mg/dL   Alkaline Phosphatase 79  39 - 117 U/L   AST 15  0 - 37 U/L   ALT 13  0 - 35 U/L   Total Protein 6.8  6.0 - 8.3 g/dL   Albumin 4.6  3.5 - 5.2 g/dL   Calcium 57.8  8.4 - 46.9 mg/dL  CA 629     Status: None   Collection Time    03/12/13  2:30 PM      Result Value Range   CA 125 8.8  0.0 - 30.2 U/mL       RADIOGRAPHY:   CT CHEST, ABDOMEN AND PELVIS WITH CONTRAST 02-25-13 Technique: Multidetector CT imaging of the chest, abdomen and  pelvis was performed following the standard protocol during bolus  administration of intravenous contrast.  Contrast: OMNIPAQUE IOHEXOL 300 MG/ML SOLN  Comparison: None.  CT CHEST  Findings: No axillary or supraclavicular lymphadenopathy. No  mediastinal or hilar  lymphadenopathy. No pericardial fluid.  Hiatal hernia is present. Upper esophagus is normal. Review of  the lung parenchyma demonstrates no suspicious nodules.  IMPRESSION:  No evidence of thoracic metastasis.  CT ABDOMEN AND PELVIS  Findings: No focal hepatic lesion. The gallbladder, pancreas,  spleen, adrenal glands, and kidneys are normal.  The stomach, small bowel, appendix, and cecum are normal. The  colon and rectosigmoid colon are normal. Delayed pyelogram phase  imaging demonstrates normal collecting systems or proximal ureters.  Delayed images through the pelvis demonstrate  normal distal  ureters.  Within the pelvis there is mild stranding consistent with recent  abdominal hysterectomy. Small amount fluid along the left external  iliac vessels which is low attenuation consistent with postsurgical  fluid. The bladder is normal. The vaginal cuff is normal. There  is no evidence of pelvic lymphadenopathy. No retroperitoneal  adenopathy. No mesenteric disease.  Review of bone windows demonstrates no aggressive osseous lesions.  IMPRESSION:  1. No evidence metastasis within the abdomen or pelvis.  2. No evidence of adenopathy in the abdomen or pelvis.  3. Postsurgical changes in the pelvis consistent with recent  abdominal hysterectomy. 4. Delayed images through the pelvis  demonstrate normal ureters   PATHOLOGY  Date collected 02-12-13 Accession #: [id number] OF SURGGNOSIS Diagnosis 1. Uterus +/- tubes/ovaries, neoplastic - INVASIVE HIGH GRADE ENDOMETRIAL CARCINOMA, 4.2 CM, CONFINED WITHIN HALF OF THE MYOMETRIUM WITH ANGIOLYMPHATIC INVASION PRESENT. PLEASE SEE ONCOLOGY TEMPLATE FOR DETAILS. - CERVIX: BENIGN SQUAMOUS MUCOSA AND ENDOCERVICAL MUCOSA, NO DYSPLASIA OR MALIGNANCY. - UTERINE SEROSA: ENDOMETRIOSIS AND ADHESIONS, NO EVIDENCE OF ATYPIA OR MALIGNANCY. - BILATERAL OVARIES: BENIGN OVARIAN TISSUE WITH SEROSAL ADHESIONS, NO ATYPIA OR MALIGNANCY. -  BILATERAL FALLOPIAN TUBES: BENIGN FALLOPIAN TUBAL TISSUE WITH SEROSAL ENDOMETRIOSIS AND PARATUBAL CYST. NO ATYPIA OR MALIGNANCY. 2. Lymph nodes, regional resection, right - TWO OF FIVE LYMPH NODES, POSITIVE FOR METASTATIC CARCINOMA (2/5) 3. Lymph nodes, regional resection, left - ONE OF TWO LYMPH NODES, POSITIVE FOR METASTATIC CARCINOMA (1/2).Pathology    PHYSICAL EXAM:  height is 5' 4.84" (1.647 m) and weight is 181 lb 1.6 oz (82.146 kg). Her oral temperature is 98.4 F (36.9 C). Her blood pressure is 161/90 and her pulse is 80. Her respiration is 18.  Healthy appearing lady who is tearful at times during conversation, sister very supportive. HEENT: PERRL, not icteric. Normal hair pattern. Oral mucosa moist and clear, posterior pharynx clear, good dentition. Neck supple without thyroid mass or JVD. Lymphatics: no cervical, supraclavicular, axillary or inguinal adenopathy.Breasts without dominant mass, skin or nipple findings. Heart RRR no gallop. Abdomen soft, not tender, normal BS, tiny incisions from the robotic procedure all closed and not remarkable. No appreciable HSM or mass. LE no edema, cords, tenderness. Skin without rash or ecchymosis. Neuro: CN, motor, sensory, cerebellar nonfocal.  We have discussed circumstances surrounding diagnosis, surgical intervention and the pathology information, and recommendation for chemotherapy with taxol and carboplatin around RT. We have discussed outpatient administration of the chemotherapy and she understands that she can contact this office at any time of questions or concerns.  She is upset about losing her hair, and we have offered the wig boutique and Look Good Feel Better programs.   She prefers treatments on Fridays and can begin on 03-15-13. She has been given written and oral information for premedication decadron for the taxol and for antiemetics. I will see her back on 03-22-13 or sooner if needed, and again on 03-27-13.   IMPRESSION /  PLAN:   1.IIIC high grade serous endometrial carcinoma: final pathology unexpected, but no evidence of other involvement by CT CAP post operatively. For additional treatment with taxol/carboplatin given in sandwich fashion with RT, chemo to begin 03-15-13., every 21 days. 2.history of migraine HAs particularly when she was young, but no problems with zofran in hospital 3.elevated lipids 4.plantar fasciitis 5.up to date on mammograms and colonoscopy  Patient and sister were appreciative of time spent on discussion and answering questions today, and are in agreement with plan above.   Reece Packer, MD  03/12/2013 10:12 PM

## 2013-03-14 ENCOUNTER — Other Ambulatory Visit: Payer: 59

## 2013-03-14 ENCOUNTER — Ambulatory Visit: Payer: 59

## 2013-03-15 ENCOUNTER — Ambulatory Visit (HOSPITAL_BASED_OUTPATIENT_CLINIC_OR_DEPARTMENT_OTHER): Payer: 59

## 2013-03-15 ENCOUNTER — Other Ambulatory Visit: Payer: Self-pay | Admitting: Oncology

## 2013-03-15 VITALS — BP 126/74 | HR 98 | Temp 98.6°F | Resp 18

## 2013-03-15 DIAGNOSIS — C541 Malignant neoplasm of endometrium: Secondary | ICD-10-CM

## 2013-03-15 DIAGNOSIS — C549 Malignant neoplasm of corpus uteri, unspecified: Secondary | ICD-10-CM

## 2013-03-15 DIAGNOSIS — Z5111 Encounter for antineoplastic chemotherapy: Secondary | ICD-10-CM

## 2013-03-15 MED ORDER — DEXAMETHASONE SODIUM PHOSPHATE 4 MG/ML IJ SOLN
20.0000 mg | Freq: Once | INTRAMUSCULAR | Status: AC
  Administered 2013-03-15: 20 mg via INTRAVENOUS

## 2013-03-15 MED ORDER — SODIUM CHLORIDE 0.9 % IV SOLN
Freq: Once | INTRAVENOUS | Status: AC
  Administered 2013-03-15: 10:00:00 via INTRAVENOUS

## 2013-03-15 MED ORDER — FAMOTIDINE IN NACL 20-0.9 MG/50ML-% IV SOLN
20.0000 mg | Freq: Once | INTRAVENOUS | Status: AC
  Administered 2013-03-15: 20 mg via INTRAVENOUS

## 2013-03-15 MED ORDER — DEXTROSE 5 % IV SOLN
175.0000 mg/m2 | Freq: Once | INTRAVENOUS | Status: AC
  Administered 2013-03-15: 342 mg via INTRAVENOUS
  Filled 2013-03-15: qty 57

## 2013-03-15 MED ORDER — DIPHENHYDRAMINE HCL 50 MG/ML IJ SOLN
50.0000 mg | Freq: Once | INTRAMUSCULAR | Status: AC
  Administered 2013-03-15: 50 mg via INTRAVENOUS

## 2013-03-15 MED ORDER — SODIUM CHLORIDE 0.9 % IV SOLN
690.0000 mg | Freq: Once | INTRAVENOUS | Status: AC
  Administered 2013-03-15: 690 mg via INTRAVENOUS
  Filled 2013-03-15: qty 69

## 2013-03-15 MED ORDER — ONDANSETRON 16 MG/50ML IVPB (CHCC)
16.0000 mg | Freq: Once | INTRAVENOUS | Status: AC
  Administered 2013-03-15: 16 mg via INTRAVENOUS

## 2013-03-15 NOTE — Progress Notes (Signed)
1040 Taxol initiated at 80ml/hr x 11 ml VTBI 1045 VSS patient with no complaints 1050 VSS patient with no complaints 1055 VSS patient with no complaints  1102 VSS patient with no complaints, rate increased to 45ml/hr x 23 ml VTBI  1117 VSS patient with no complaints, rate increased to 165ml/hr x 35ml VTBI  1132 VSS patient with no complaints, rate increased to 123ml/hr x 46ml VTBI, at goal rate.  1147 VSS patient with no complaints, continues at goal rate of 185 ml/hr till completion.

## 2013-03-15 NOTE — Patient Instructions (Addendum)
[facility] Discharge Instructions for Patients Receiving Chemotherapy  Today you received the following chemotherapy agents Taxol and Carboplatin  To help prevent nausea and vomiting after your treatment, we encourage you to take your nausea medication. Begin taking your nausea medication as often as prescribed for by Dr. Darrold Span.    If you develop nausea and vomiting that is not controlled by your nausea medication, call the clinic. If it is after clinic hours your family physician or the after hours number for the clinic or go to the Emergency Department.   BELOW ARE SYMPTOMS THAT SHOULD BE REPORTED IMMEDIATELY:  *FEVER GREATER THAN 100.5 F  *CHILLS WITH OR WITHOUT FEVER  NAUSEA AND VOMITING THAT IS NOT CONTROLLED WITH YOUR NAUSEA MEDICATION  *UNUSUAL SHORTNESS OF BREATH  *UNUSUAL BRUISING OR BLEEDING  TENDERNESS IN MOUTH AND THROAT WITH OR WITHOUT PRESENCE OF ULCERS  *URINARY PROBLEMS  *BOWEL PROBLEMS  UNUSUAL RASH Items with * indicate a potential emergency and should be followed up as soon as possible.  One of the nurses will contact you 24 hours after your treatment. Please let the nurse know about any problems that you may have experienced. Feel free to call the clinic you have any questions or concerns. The clinic phone number is [phone].   I have been informed and understand all the instructions given to me. I know to contact the clinic, my physician, or go to the Emergency Department if any problems should occur. I do not have any questions at this time, but understand that I may call the clinic during office hours   should I have any questions or need assistance in obtaining follow up care.    __________________________________________  _____________  __________ Signature of Patient or Authorized Representative            Date                   Time    __________________________________________ Nurse's Signature    Paclitaxel  injection What is this medicine? PACLITAXEL (PAK li TAX el) is a chemotherapy drug. It targets fast dividing cells, like cancer cells, and causes these cells to die. This medicine is used to treat ovarian cancer, breast cancer, and other cancers. This medicine may be used for other purposes; ask your health care provider or pharmacist if you have questions. What should I tell my health care provider before I take this medicine? They need to know if you have any of these conditions: -blood disorders -irregular heartbeat -infection (especially a virus infection such as chickenpox, cold sores, or herpes) -liver disease -previous or ongoing radiation therapy -an unusual or allergic reaction to paclitaxel, alcohol, polyoxyethylated castor oil, other chemotherapy agents, other medicines, foods, dyes, or preservatives -pregnant or trying to get pregnant -breast-feeding How should I use this medicine? This drug is given as an infusion into a vein. It is administered in a hospital or clinic by a specially trained health care professional. Talk to your pediatrician regarding the use of this medicine in children. Special care may be needed. Overdosage: If you think you have taken too much of this medicine contact a poison control center or emergency room at once. NOTE: This medicine is only for you. Do not share this medicine with others. What if I miss a dose? It is important not to miss your dose. Call your doctor or health care professional if you are unable to keep an appointment. What may interact with this medicine? Do not  take this medicine with any of the following medications: -disulfiram -metronidazole This medicine may also interact with the following medications: -cyclosporine -dexamethasone -diazepam -ketoconazole -medicines to increase blood counts like filgrastim, pegfilgrastim, sargramostim -other chemotherapy drugs like cisplatin, doxorubicin, epirubicin, etoposide, teniposide,  vincristine -quinidine -testosterone -vaccines -verapamil Talk to your doctor or health care professional before taking any of these medicines: -acetaminophen -aspirin -ibuprofen -ketoprofen -naproxen This list may not describe all possible interactions. Give your health care provider a list of all the medicines, herbs, non-prescription drugs, or dietary supplements you use. Also tell them if you smoke, drink alcohol, or use illegal drugs. Some items may interact with your medicine. What should I watch for while using this medicine? Your condition will be monitored carefully while you are receiving this medicine. You will need important blood work done while you are taking this medicine. This drug may make you feel generally unwell. This is not uncommon, as chemotherapy can affect healthy cells as well as cancer cells. Report any side effects. Continue your course of treatment even though you feel ill unless your doctor tells you to stop. In some cases, you may be given additional medicines to help with side effects. Follow all directions for their use. Call your doctor or health care professional for advice if you get a fever, chills or sore throat, or other symptoms of a cold or flu. Do not treat yourself. This drug decreases your body's ability to fight infections. Try to avoid being around people who are sick. This medicine may increase your risk to bruise or bleed. Call your doctor or health care professional if you notice any unusual bleeding. Be careful brushing and flossing your teeth or using a toothpick because you may get an infection or bleed more easily. If you have any dental work done, tell your dentist you are receiving this medicine. Avoid taking products that contain aspirin, acetaminophen, ibuprofen, naproxen, or ketoprofen unless instructed by your doctor. These medicines may hide a fever. Do not become pregnant while taking this medicine. Women should inform their doctor if  they wish to become pregnant or think they might be pregnant. There is a potential for serious side effects to an unborn child. Talk to your health care professional or pharmacist for more information. Do not breast-feed an infant while taking this medicine. Men are advised not to father a child while receiving this medicine. What side effects may I notice from receiving this medicine? Side effects that you should report to your doctor or health care professional as soon as possible: -allergic reactions like skin rash, itching or hives, swelling of the face, lips, or tongue -low blood counts - This drug may decrease the number of white blood cells, red blood cells and platelets. You may be at increased risk for infections and bleeding. -signs of infection - fever or chills, cough, sore throat, pain or difficulty passing urine -signs of decreased platelets or bleeding - bruising, pinpoint red spots on the skin, black, tarry stools, nosebleeds -signs of decreased red blood cells - unusually weak or tired, fainting spells, lightheadedness -breathing problems -chest pain -high or low blood pressure -mouth sores -nausea and vomiting -pain, swelling, redness or irritation at the injection site -pain, tingling, numbness in the hands or feet -slow or irregular heartbeat -swelling of the ankle, feet, hands Side effects that usually do not require medical attention (report to your doctor or health care professional if they continue or are bothersome): -bone pain -complete hair loss including hair on  your head, underarms, pubic hair, eyebrows, and eyelashes -changes in the color of fingernails -diarrhea -loosening of the fingernails -loss of appetite -muscle or joint pain -red flush to skin -sweating This list may not describe all possible side effects. Call your doctor for medical advice about side effects. You may report side effects to FDA at 1-800-FDA-1088. Where should I keep my  medicine? This drug is given in a hospital or clinic and will not be stored at home. NOTE: This sheet is a summary. It may not cover all possible information. If you have questions about this medicine, talk to your doctor, pharmacist, or health care provider.  2013, [employer] Standard. (11/03/2008 11:54:26 AM)   CARBOPLATIN (KAR boe pla tin) is a chemotherapy drug. It targets fast dividing cells, like cancer cells, and causes these cells to die. This medicine is used to treat ovarian cancer and many other cancers. This medicine may be used for other purposes; ask your health care provider or pharmacist if you have questions. What should I tell my health care provider before I take this medicine? They need to know if you have any of these conditions: -blood disorders -hearing problems -kidney disease -recent or ongoing radiation therapy -an unusual or allergic reaction to carboplatin, cisplatin, other chemotherapy, other medicines, foods, dyes, or preservatives -pregnant or trying to get pregnant -breast-feeding How should I use this medicine? This drug is usually given as an infusion into a vein. It is administered in a hospital or clinic by a specially trained health care professional. Talk to your pediatrician regarding the use of this medicine in children. Special care may be needed. Overdosage: If you think you have taken too much of this medicine contact a poison control center or emergency room at once. NOTE: This medicine is only for you. Do not share this medicine with others. What if I miss a dose? It is important not to miss a dose. Call your doctor or health care professional if you are unable to keep an appointment. What may interact with this medicine? -medicines for seizures -medicines to increase blood counts like filgrastim, pegfilgrastim, sargramostim -some antibiotics like amikacin, gentamicin, neomycin, streptomycin, tobramycin -vaccines Talk to your doctor or  health care professional before taking any of these medicines: -acetaminophen -aspirin -ibuprofen -ketoprofen -naproxen This list may not describe all possible interactions. Give your health care provider a list of all the medicines, herbs, non-prescription drugs, or dietary supplements you use. Also tell them if you smoke, drink alcohol, or use illegal drugs. Some items may interact with your medicine. What should I watch for while using this medicine? Your condition will be monitored carefully while you are receiving this medicine. You will need important blood work done while you are taking this medicine. This drug may make you feel generally unwell. This is not uncommon, as chemotherapy can affect healthy cells as well as cancer cells. Report any side effects. Continue your course of treatment even though you feel ill unless your doctor tells you to stop. In some cases, you may be given additional medicines to help with side effects. Follow all directions for their use. Call your doctor or health care professional for advice if you get a fever, chills or sore throat, or other symptoms of a cold or flu. Do not treat yourself. This drug decreases your body's ability to fight infections. Try to avoid being around people who are sick. This medicine may increase your risk to bruise or bleed. Call your doctor or health care  professional if you notice any unusual bleeding. Be careful brushing and flossing your teeth or using a toothpick because you may get an infection or bleed more easily. If you have any dental work done, tell your dentist you are receiving this medicine. Avoid taking products that contain aspirin, acetaminophen, ibuprofen, naproxen, or ketoprofen unless instructed by your doctor. These medicines may hide a fever. Do not become pregnant while taking this medicine. Women should inform their doctor if they wish to become pregnant or think they might be pregnant. There is a potential for  serious side effects to an unborn child. Talk to your health care professional or pharmacist for more information. Do not breast-feed an infant while taking this medicine. What side effects may I notice from receiving this medicine? Side effects that you should report to your doctor or health care professional as soon as possible: -allergic reactions like skin rash, itching or hives, swelling of the face, lips, or tongue -signs of infection - fever or chills, cough, sore throat, pain or difficulty passing urine -signs of decreased platelets or bleeding - bruising, pinpoint red spots on the skin, black, tarry stools, nosebleeds -signs of decreased red blood cells - unusually weak or tired, fainting spells, lightheadedness -breathing problems -changes in hearing -changes in vision -chest pain -high blood pressure -low blood counts - This drug may decrease the number of white blood cells, red blood cells and platelets. You may be at increased risk for infections and bleeding. -nausea and vomiting -pain, swelling, redness or irritation at the injection site -pain, tingling, numbness in the hands or feet -problems with balance, talking, walking -trouble passing urine or change in the amount of urine Side effects that usually do not require medical attention (report to your doctor or health care professional if they continue or are bothersome): -hair loss -loss of appetite -metallic taste in the mouth or changes in taste This list may not describe all possible side effects. Call your doctor for medical advice about side effects. You may report side effects to FDA at 1-800-FDA-1088. Where should I keep my medicine? This drug is given in a hospital or clinic and will not be stored at home. NOTE: This sheet is a summary. It may not cover all possible information. If you have questions about this medicine, talk to your doctor, pharmacist, or health care provider.  2012, [employer]/Gold Standard.  (02/26/2008 2:38:05 PM)

## 2013-03-18 ENCOUNTER — Telehealth: Payer: Self-pay | Admitting: *Deleted

## 2013-03-18 NOTE — Telephone Encounter (Signed)
Called patient to discuss chemo: she is constipated (no BM since 4/11)-suggested MiraLax 17 grams daily and Senna-S #2-4 daily. Push po fluids, eat prunes or prune jucie or raisin bran cereal. Stay active. She also reports feeling achy in her legs-made her aware that Taxol can cause this sensation. Suggested she try OTC Tylenol or Ibuprofen. Call office tomorrow if the pain is not better or if she does not have BM.

## 2013-03-22 ENCOUNTER — Other Ambulatory Visit (HOSPITAL_BASED_OUTPATIENT_CLINIC_OR_DEPARTMENT_OTHER): Payer: 59 | Admitting: Lab

## 2013-03-22 ENCOUNTER — Telehealth: Payer: Self-pay | Admitting: Oncology

## 2013-03-22 ENCOUNTER — Other Ambulatory Visit: Payer: Self-pay

## 2013-03-22 ENCOUNTER — Other Ambulatory Visit: Payer: 59 | Admitting: Lab

## 2013-03-22 ENCOUNTER — Ambulatory Visit (HOSPITAL_BASED_OUTPATIENT_CLINIC_OR_DEPARTMENT_OTHER): Payer: 59 | Admitting: Oncology

## 2013-03-22 ENCOUNTER — Encounter: Payer: Self-pay | Admitting: Oncology

## 2013-03-22 ENCOUNTER — Ambulatory Visit: Payer: 59 | Admitting: Oncology

## 2013-03-22 VITALS — BP 133/88 | HR 90 | Temp 97.8°F | Resp 18 | Ht 64.84 in | Wt 174.7 lb

## 2013-03-22 DIAGNOSIS — C541 Malignant neoplasm of endometrium: Secondary | ICD-10-CM

## 2013-03-22 DIAGNOSIS — C549 Malignant neoplasm of corpus uteri, unspecified: Secondary | ICD-10-CM

## 2013-03-22 LAB — CBC WITH DIFFERENTIAL/PLATELET
BASO%: 0.5 % (ref 0.0–2.0)
Basophils Absolute: 0 10*3/uL (ref 0.0–0.1)
Eosinophils Absolute: 0.1 10*3/uL (ref 0.0–0.5)
HCT: 37.8 % (ref 34.8–46.6)
HGB: 12.4 g/dL (ref 11.6–15.9)
MONO#: 0.1 10*3/uL (ref 0.1–0.9)
NEUT%: 54.5 % (ref 38.4–76.8)
WBC: 4.1 10*3/uL (ref 3.9–10.3)
lymph#: 1.6 10*3/uL (ref 0.9–3.3)

## 2013-03-22 MED ORDER — DEXAMETHASONE 4 MG PO TABS: ORAL_TABLET | ORAL | Status: DC

## 2013-03-22 NOTE — Patient Instructions (Signed)
Stool softerner once or twice daily is fine.   Senokot S  1-2 tablets once or twice daily is fine (this has stool softener + laxative) and / or Miralax one capful once or twice daily.   You should try to be sure bowels move very well the day before chemo, then start SenokotS or miralax regularly beginning at least the night of chemo and daily for several days.  Call if you are very fatigued before scheduled visit and repeat labs on 4-23.   [phone]

## 2013-03-22 NOTE — Telephone Encounter (Signed)
Per staff phone call and POF I have schedueld appts.  JMW  

## 2013-03-22 NOTE — Progress Notes (Signed)
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 She has had no gCSF as yet. She had problems with constipation and some taxol aching in legs, but no acute problems from the chemotherapy otherwise.  Oncologic History Patient had been in usual very good health until vaginal bleeding beginning 11-28-12. She was seen in January by PCP Dr Milinda Antis, who had done her gyn exams previously, and sent to Dr Kirtland Bouchard.Fogleman. 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 She saw Dr Nelly Rout on 02-21-13 and was taken to total robotic hysterectomy/BSO/bilateral pelvic nodes, with frozen section showing grade 1 tumor with minimal myometrial involvement, such that paraaortic nodes were not evaluated. She was hospitalized just 1.5 days and required no pain medication after DC home. Final pathology ([id number]), however, showed invasive with a well differentiated component and a high grade serous component. total size was 4.2 cm and invasion 0.3 cm where myometrium 1.8 cm thick; the serous component had LVSI and 3 of 7 pelvic nodes were involved with the serous component. She saw Dr Duard Brady postoperatively on 02-21-13, recovering well from the surgery. She had CT CAP on 02-25-13 with no findings of concern in the chest and no retroperitoneal adenopathy or other findings of concern for metastatic disease. She has seen Dr Roselind Messier in consultation, with recommendation for IMRT after 3 cycles of chemotherapy and vaginal vault brachytherapy during the final 3 cycles of chemotherapy.  Bowels did not move for ~ 4 days after chemo, then resolved with 1-2 doses  of senokot S and miralax, and prunes. They have been moving mostly daily since that. She had aching in lower legs beginning ~ day 4 and lasting ~ 2 days, resolved now. No peripheral neuropathy. She has had more exertional fatigue than baseline, could vacuum only half of house before she had to lie down. No significant nausea and no vomiting. No fever or symptoms of infection. Peripheral IV access obtained without difficulty. No SOB.  Remainder of 10 point Review of Systems negative.  She and husband were long time neighbors of my patient [employer] [employer].  Objective:  Vital signs in last 24 hours:  BP 133/88  Pulse 90  Temp(Src) 97.8 F (36.6 C) (Oral)  Resp 18  Ht 5' 4.84" (1.647 m)  Wt 174 lb 11.2 oz (79.243 kg)  BMI 29.21 kg/m2  Weight is down 6 lbs. Easily ambulatory, NAD. No alopecia.  HEENT:PERRLA, sclera clear, anicteric and oropharynx clear, no lesions  Mucous membranes moist. LymphaticsCervical, supraclavicular, and axillary nodes normal. No inguinal adenopathy Resp: clear to auscultation bilaterally and normal percussion bilaterally Cardio: regular rate and rhythm GI: soft, non-tender; bowel sounds normal; no masses,  no organomegaly Surgical incisions not remarkable. Extremities: extremities normal, atraumatic, no cyanosis or edema Neuro:no sensory deficits noted Skin without rash or ecchymosis  Lab Results:  Results for orders placed in visit on 03/22/13  CBC WITH DIFFERENTIAL      Result Value Range   WBC 4.1  3.9 - 10.3 10e3/uL   NEUT# 2.2  1.5 - 6.5 10e3/uL   HGB 12.4  11.6 - 15.9 g/dL   HCT 54.0  98.1 - 19.1 %   Platelets 172  145 - 400 10e3/uL  MCV 84.4  79.5 - 101.0 fL   MCH 27.7  25.1 - 34.0 pg   MCHC 32.8  31.5 - 36.0 g/dL   RBC 1.47  8.29 - 5.62 10e6/uL   RDW 13.7  11.2 - 14.5 %   lymph# 1.6  0.9 - 3.3 10e3/uL   MONO# 0.1  0.1 - 0.9 10e3/uL   Eosinophils Absolute 0.1  0.0 - 0.5 10e3/uL   Basophils Absolute 0.0  0.0 - 0.1 10e3/uL   NEUT% 54.5  38.4 -  76.8 %   LYMPH% 39.4  14.0 - 49.7 %   MONO% 3.4  0.0 - 14.0 %   EOS% 2.2  0.0 - 7.0 %   BASO% 0.5  0.0 - 2.0 %   nRBC 0  0 - 0 %    She understands that counts are expected to decrease some further in next week   Studies/Results:   We have reviewed results of the CT CAP from 02-25-13 again at patient's request.  Medications: I have reviewed the patient's current medications. We have discussed using laxatives beginning at least day of next chemo.  Assessment/Plan:  1.IIIC high grade serous endometrial carcinoma: final pathology unexpected, but no evidence of other involvement by CT CAP post operatively.  Taxol/carboplatin to be given in sandwich fashion with RT, chemo begun 03-15-13. I will see her again for recheck counts on 03-27-13. She has follow up with Dr Duard Brady on 4-30 and will have CBC that day to be sure counts ok for cycle 2 taxol/carbo on 04-05-13. 2.history of migraine HAs but no problems with zofran  Patient and husband had questions answered to their satisfaction and understand that they can call at any time if needed.  LIVESAY,LENNIS P, MD   03/22/2013, 8:20 PM

## 2013-03-27 ENCOUNTER — Encounter: Payer: Self-pay | Admitting: Oncology

## 2013-03-27 ENCOUNTER — Telehealth: Payer: Self-pay | Admitting: *Deleted

## 2013-03-27 ENCOUNTER — Telehealth: Payer: Self-pay | Admitting: Oncology

## 2013-03-27 ENCOUNTER — Ambulatory Visit (HOSPITAL_BASED_OUTPATIENT_CLINIC_OR_DEPARTMENT_OTHER): Payer: 59 | Admitting: Oncology

## 2013-03-27 ENCOUNTER — Other Ambulatory Visit: Payer: Self-pay

## 2013-03-27 ENCOUNTER — Other Ambulatory Visit (HOSPITAL_BASED_OUTPATIENT_CLINIC_OR_DEPARTMENT_OTHER): Payer: 59 | Admitting: Lab

## 2013-03-27 VITALS — BP 137/91 | HR 90 | Temp 97.8°F | Resp 20 | Ht 64.84 in | Wt 180.4 lb

## 2013-03-27 DIAGNOSIS — C549 Malignant neoplasm of corpus uteri, unspecified: Secondary | ICD-10-CM

## 2013-03-27 DIAGNOSIS — D709 Neutropenia, unspecified: Secondary | ICD-10-CM

## 2013-03-27 DIAGNOSIS — C541 Malignant neoplasm of endometrium: Secondary | ICD-10-CM

## 2013-03-27 LAB — CBC WITH DIFFERENTIAL/PLATELET
Basophils Absolute: 0 10*3/uL (ref 0.0–0.1)
Eosinophils Absolute: 0.1 10*3/uL (ref 0.0–0.5)
HGB: 11.5 g/dL — ABNORMAL LOW (ref 11.6–15.9)
MCV: 84.2 fL (ref 79.5–101.0)
MONO%: 26.4 % — ABNORMAL HIGH (ref 0.0–14.0)
NEUT#: 0.2 10*3/uL — CL (ref 1.5–6.5)
Platelets: 183 10*3/uL (ref 145–400)
RDW: 13.6 % (ref 11.2–14.5)

## 2013-03-27 MED ORDER — CIPROFLOXACIN HCL 250 MG PO TABS: 250.0000 mg | ORAL_TABLET | Freq: Two times a day (BID) | ORAL | Status: DC

## 2013-03-27 MED ORDER — FILGRASTIM 300 MCG/0.5ML IJ SOLN
300.0000 ug | Freq: Once | INTRAMUSCULAR | Status: AC
  Administered 2013-03-27: 300 ug via SUBCUTANEOUS
  Filled 2013-03-27: qty 0.5

## 2013-03-27 NOTE — Patient Instructions (Signed)
Call if fever >=100.5 or symptoms of infection while white blood count is low. We will give you another neupogen shot on Thurs 4-24, then will check blood counts on Fri 4-25 to see if you need another shot then.   Ciprofloxacin 250 mg twice daily begin this pm while white blood count is low -- you can stop this antibiotic when we tell you that your white blood count is back up well.   Your next chemo will be May 2 at 9 AM.  You need to take the five steroid tablets (decadron/dexamethasone) with food 12 hours before = 9 PM on May 1, then another five tablets with food at 3 AM on May 2.

## 2013-03-27 NOTE — Telephone Encounter (Signed)
Per staff phone call and POF I have schedueld appts.  JMW  

## 2013-03-27 NOTE — Telephone Encounter (Signed)
gve the pt her April,may 2014 appt calendars.

## 2013-03-27 NOTE — Progress Notes (Signed)
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 She has had no gCSF as yet and is neutropenic today, but has no symptoms of infection and overall is feeling better than at visit last week.  Oncologic History  Patient had been in usual very good health until vaginal bleeding beginning 11-28-12. She was seen in January by PCP Dr Milinda Antis, who had done her gyn exams previously, and sent to Dr Kirtland Bouchard.Fogleman. 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 She saw Dr Nelly Rout on 02-21-13 and was taken to total robotic hysterectomy/BSO/bilateral pelvic nodes, with frozen section showing grade 1 tumor with minimal myometrial involvement, such that paraaortic nodes were not evaluated. She was hospitalized just 1.5 days and required no pain medication after DC home. Final pathology ([id number]), however, showed invasive with a well differentiated component and a high grade serous component. total size was 4.2 cm and invasion 0.3 cm where myometrium 1.8 cm thick; the serous component had LVSI and 3 of 7 pelvic nodes were involved with the serous component. She saw Dr Duard Brady postoperatively on 02-21-13, recovering well from the surgery. She had CT CAP on 02-25-13 with no findings of concern in the chest and no retroperitoneal adenopathy or other findings of concern for metastatic disease. She has seen Dr Roselind Messier in consultation, with recommendation for IMRT after 3 cycles of chemotherapy and vaginal vault brachytherapy during the final 3 cycles of chemotherapy.   Patient has had no fever or symptoms of infection. She feels less  fatigued, has had no nausea and better appetite past few days, no aching now (note taxol aches in legs were not severe in week after treatment). She has no peripheral neuropathy symptoms. Bowels moved well today using Senokot S. She has had no bleeding, no respiratory symptoms, and po intake is good now. Remainder of 10 point Review of Systems negative.  Objective:  Vital signs in last 24 hours:  BP 137/91  Pulse 90  Temp(Src) 97.8 F (36.6 C) (Oral)  Resp 20  Ht 5' 4.84" (1.647 m)  Wt 180 lb 6.4 oz (81.829 kg)  BMI 30.17 kg/m2  Weight is up 6 lbs from last week. No alopecia. Easily ambulatory, looks comfortable.  HEENT:PERRLA, sclera clear, anicteric and oropharynx clear, no lesions Mucous membranes moist LymphaticsCervical, supraclavicular, and axillary nodes normal. No inguinal adenopathy Resp: clear to auscultation bilaterally and normal percussion bilaterally Cardio: regular rate and rhythm GI: soft, non-tender; bowel sounds normal; no masses,  no organomegaly. Well healed incisions from the laparoscopic surgery. Extremities: extremities normal, atraumatic, no cyanosis or edema Neuro:no sensory deficits noted Skin without rash or ecchymoses.  Lab Results:  Results for orders placed in visit on 03/27/13  CBC WITH DIFFERENTIAL      Result Value Range   WBC 2.4 (*) 3.9 - 10.3 10e3/uL   NEUT# 0.2 (*) 1.5 - 6.5 10e3/uL   HGB 11.5 (*) 11.6 - 15.9 g/dL   HCT 54.0 (*) 98.1 - 19.1 %   Platelets 183  145 - 400 10e3/uL   MCV 84.2  79.5 - 101.0 fL   MCH 28.0  25.1 - 34.0 pg   MCHC 33.2  31.5 - 36.0 g/dL   RBC 4.78  3.70 - 5.45 10e6/uL   RDW 13.6  11.2 - 14.5 %   lymph# 1.5  0.9 - 3.3 10e3/uL   MONO# 0.6  0.1 - 0.9 10e3/uL   Eosinophils Absolute 0.1  0.0 - 0.5 10e3/uL   Basophils Absolute 0.0  0.0 - 0.1 10e3/uL   NEUT% 7.1 (*) 38.4 - 76.8 %   LYMPH% 62.8 (*) 14.0 - 49.7 %   MONO% 26.4 (*) 0.0 - 14.0 %   EOS% 2.9  0.0 - 7.0 %   BASO% 0.8  0.0 - 2.0 %   nRBC 0  0 - 0 %    Counts probably past nadir with platelets as above. CMET not done today; will add that to CBC on 4-25 as chemistries needed prior to cycle 2 chemo.  Studies/Results:  No results found.  Medications: I have reviewed the patient's current medications. She is given neupogen 300 today and will have this also on 4-24; neupogen will be given on 4-25 if ANC < 1.5 by cbc that day. With ANC <0.5 now, she will also start cipro 250 mg bid until no longer neutropenic.  Patient has been instructed in neutropenic precautions.   Assessment/Plan:  1.IIIC high grade serous endometrial carcinoma: neutropenic now day 13 cycle 1 taxol carboplatin. Neupogen, oral cipro and neutropenic precautions as above. WIll use neulasta with cycle 2. Keep visit with Dr Duard Brady on 04-03-13. I have given her written and oral instructions for premed decadron times for cycle 2 treatment. 2.Constipation: improved. She understands that she may have to increase senokot S after each chemotherapy treatment. 3.history of migraines, but tolerating zofran without difficulty.   Patient understands all instructiona and is in agreement with plan above.  Kieana Raffael Bugarin P, MD   03/27/2013, 4:12 PM

## 2013-03-28 ENCOUNTER — Ambulatory Visit (HOSPITAL_BASED_OUTPATIENT_CLINIC_OR_DEPARTMENT_OTHER): Payer: 59

## 2013-03-28 VITALS — BP 141/73 | HR 88 | Temp 98.5°F

## 2013-03-28 DIAGNOSIS — C549 Malignant neoplasm of corpus uteri, unspecified: Secondary | ICD-10-CM

## 2013-03-28 DIAGNOSIS — Z5189 Encounter for other specified aftercare: Secondary | ICD-10-CM

## 2013-03-28 DIAGNOSIS — C541 Malignant neoplasm of endometrium: Secondary | ICD-10-CM

## 2013-03-28 MED ORDER — FILGRASTIM 300 MCG/0.5ML IJ SOLN
300.0000 ug | Freq: Once | INTRAMUSCULAR | Status: AC
  Administered 2013-03-28: 300 ug via SUBCUTANEOUS
  Filled 2013-03-28: qty 0.5

## 2013-03-29 ENCOUNTER — Other Ambulatory Visit (HOSPITAL_BASED_OUTPATIENT_CLINIC_OR_DEPARTMENT_OTHER): Payer: 59 | Admitting: Lab

## 2013-03-29 ENCOUNTER — Ambulatory Visit: Payer: 59

## 2013-03-29 DIAGNOSIS — C541 Malignant neoplasm of endometrium: Secondary | ICD-10-CM

## 2013-03-29 DIAGNOSIS — C549 Malignant neoplasm of corpus uteri, unspecified: Secondary | ICD-10-CM

## 2013-03-29 LAB — CBC WITH DIFFERENTIAL/PLATELET
BASO%: 0.3 % (ref 0.0–2.0)
Basophils Absolute: 0 10*3/uL (ref 0.0–0.1)
Eosinophils Absolute: 0.1 10*3/uL (ref 0.0–0.5)
HCT: 34.9 % (ref 34.8–46.6)
LYMPH%: 17.5 % (ref 14.0–49.7)
MCHC: 32.2 g/dL (ref 31.5–36.0)
MONO#: 2.2 10*3/uL — ABNORMAL HIGH (ref 0.1–0.9)
NEUT%: 64.7 % (ref 38.4–76.8)
Platelets: 183 10*3/uL (ref 145–400)
WBC: 13.6 10*3/uL — ABNORMAL HIGH (ref 3.9–10.3)

## 2013-03-29 LAB — COMPREHENSIVE METABOLIC PANEL (CC13)
BUN: 9.1 mg/dL (ref 7.0–26.0)
CO2: 25 mEq/L (ref 22–29)
Creatinine: 0.7 mg/dL (ref 0.6–1.1)
Glucose: 94 mg/dl (ref 70–99)
Total Bilirubin: <0.2 mg/dL (ref 0.20–1.20)

## 2013-03-29 NOTE — Progress Notes (Signed)
Jane Ruiz here for lab and possible Neupogen injection, if ANC <1.5.  Today ANC 8.8.   Neupogen held.

## 2013-04-03 ENCOUNTER — Encounter: Payer: Self-pay | Admitting: Gynecologic Oncology

## 2013-04-03 ENCOUNTER — Other Ambulatory Visit (HOSPITAL_BASED_OUTPATIENT_CLINIC_OR_DEPARTMENT_OTHER): Payer: 59 | Admitting: Lab

## 2013-04-03 ENCOUNTER — Ambulatory Visit: Payer: 59 | Attending: Gynecologic Oncology | Admitting: Gynecologic Oncology

## 2013-04-03 VITALS — BP 110/64 | HR 80 | Temp 98.5°F | Resp 16 | Ht 64.84 in | Wt 179.6 lb

## 2013-04-03 DIAGNOSIS — C541 Malignant neoplasm of endometrium: Secondary | ICD-10-CM

## 2013-04-03 DIAGNOSIS — C549 Malignant neoplasm of corpus uteri, unspecified: Secondary | ICD-10-CM | POA: Insufficient documentation

## 2013-04-03 DIAGNOSIS — N838 Other noninflammatory disorders of ovary, fallopian tube and broad ligament: Secondary | ICD-10-CM | POA: Insufficient documentation

## 2013-04-03 LAB — CBC WITH DIFFERENTIAL/PLATELET
Eosinophils Absolute: 0 10*3/uL (ref 0.0–0.5)
HCT: 37 % (ref 34.8–46.6)
HGB: 12.4 g/dL (ref 11.6–15.9)
LYMPH%: 22.8 % (ref 14.0–49.7)
MONO#: 1.2 10*3/uL — ABNORMAL HIGH (ref 0.1–0.9)
NEUT#: 5.1 10*3/uL (ref 1.5–6.5)
NEUT%: 62.1 % (ref 38.4–76.8)
Platelets: 213 10*3/uL (ref 145–400)
WBC: 8.2 10*3/uL (ref 3.9–10.3)
lymph#: 1.9 10*3/uL (ref 0.9–3.3)

## 2013-04-03 NOTE — Progress Notes (Signed)
Consult Note: Gyn-Onc  CC:  Chief Complaint  Patient presents with  . Endo cancer    Follow up    HPI: This is a 52 year old gravida 1 para 1 last normal menstrual period in 2011. [name] notated vaginal bleeding on 11/28/2012. She presented to Dr. Algie Coffer and an endometrial biopsy was collected on 01/06/2013. 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  A vaginal ultrasound collected on 01/16/2013 demonstrates a uterus measuring 6.9 cm in greatest length. The left ovary is noted to have a hyperechoic area or her blood flow which may represent a dermoid cyst or other etiology. A small simple cyst is also noted.  She had continuous spotting since the biopsy she denies any weight loss changes in appetite, there've been no changes in bowel or rectal habits. She denies early satiety and denies bleeding from the bladder or rectum.    She underwent a total robotic hysterectomy bilateral salpingo-oophorectomy bilateral pelvic lymph node dissection on March 11 22,014. Operative findings included physiologic adhesions of the rectosigmoid colon to left pelvic sidewall. Chest small normal-appearing uterus with normal adnexa. Frozen section revealed a grade 1 endometrial adenocarcinoma with minimal myometrial invasion.  However, final pathology revealed: 1. Uterus +/- tubes/ovaries, neoplastic - INVASIVE HIGH GRADE ENDOMETRIAL CARCINOMA, 4.2 CM, CONFINED WITHIN HALF OF THE MYOMETRIUM WITH ANGIOLYMPHATIC INVASION PRESENT. PLEASE SEE ONCOLOGY TEMPLATE FOR DETAILS. - CERVIX: BENIGN SQUAMOUS MUCOSA AND ENDOCERVICAL MUCOSA, NO DYSPLASIA OR MALIGNANCY. - UTERINE SEROSA: ENDOMETRIOSIS AND ADHESIONS, NO EVIDENCE OF ATYPIA OR MALIGNANCY. - BILATERAL OVARIES: BENIGN OVARIAN TISSUE WITH SEROSAL ADHESIONS, NO ATYPIA OR MALIGNANCY. - BILATERAL FALLOPIAN TUBES: BENIGN FALLOPIAN TUBAL TISSUE WITH SEROSAL ENDOMETRIOSIS AND PARATUBAL CYST. NO ATYPIA OR  MALIGNANCY. 2. Lymph nodes, regional resection, right - TWO OF FIVE LYMPH NODES, POSITIVE FOR METASTATIC CARCINOMA (2/5) 3. Lymph nodes, regional resection, left - ONE OF TWO LYMPH NODES, POSITIVE FOR METASTATIC CARCINOMA (1/2).  Microscopic Comment 1. UTERUS Specimen: Uterus, cervix, bilateral ovaries and fallopian tubes. Procedure: Total hysterectomy and bilateral salpingo-oophorectomy. Lymph node sampling performed: Yes. Specimen integrity: Intact. Maximum tumor size (cm): 4.2 cm, gross measurement. Histologic type: Invasive endometrial carcinoma with both serous carcinoma and well differentiated endometrioid carcinoma. Grade: High grade Myometrial invasion: 0.3 cm where myometrium is 1.8 cm in thickness Cervical stromal involvement: Not identified. Extent of involvement of other organs: No. Lymph vascular invasion: Present 1 of 3 FINAL for KURT, AZIMI ([id number]) Microscopic Comment(continued) Peritoneal washings: Not performed. Lymph nodes: number examined - 7 ; number positive - 3 TNM code: pT1a, pN1 FIGO Stage (based on pathologic findings, needs clinical correlation): IIIC1 Comments: Grossly, there is a 4.2 cm endometrial mass identified. The tumor is completely submitted for microscopic examination. Sections show a distinct biphasic morphology in which areas of well differentiated endometrioid adenocarcinoma merged with areas of serous carcinoma (20%). Angiolymphatic invasion with serous carcinoma component is present. The tumor is confined within inner half of the myometrium. Immunostains were performed and the endometrioid carcinoma cells are strongly positive for ER and weakly positive for p53 and p16. The serous carcinoma component is strongly positive for p53 and p16, patchy weakly positive for ER. Three of seven pelvic lymph nodes are positive for metastatic serous carcinoma component.  I spoke with her and her husband at some length on March 18 over the phone and they  wanted to come in to review everything and we did so on 02/21/13. Patient comes accompanied by her husband as well as  her daughter. She's overall doing quite well. 45 minutes face to face time was spent discussing her pathology, the recommendations for paclitaxel and carboplatin with radiation in "sandwich" fashion. She will undergo 3 cycles of paclitaxel and carboplatin followed by a 2-3 week break followed by radiation therapy then another 2-3 week break followed by an additional 3 cycles of chemotherapy. She's been be scheduled for CT scan of the abdomen and pelvis to evaluate for other evidence of para-aortic or other metastatic disease. She did not undergo a para-aortic lymphadenectomy as frozen section revealed a grade 1 lesion with less than 50% myometrial invasion.  CT scan on 02/25/13 did not reveal any evidence of metastatic disease or any other para-aortic lymphadenopathy. Her CA 125 prior to cycle #1 on April 8 was 8.8. She underwent cycle #1 of chemotherapy with Dr. Darrold Span on April 18. She has small amount of constipation that was okay when she took a stool softener. She had some bone aching for which she took Tylenol. She is due for her second cycle this coming Friday. With regards to her postoperative recovery she denies any pain or vaginal bleeding. She is very pleased that she has done as well as she has postoperatively and at the beginning of her chemotherapy.  Current Meds:  Outpatient Encounter Prescriptions as of 04/03/2013  Medication Sig Dispense Refill  . atorvastatin (LIPITOR) 10 MG tablet Take 1 tablet (10 mg total) by mouth every evening.  90 tablet  1  . calcium carbonate (ANTACID EXTRA STRENGTH) 750 MG chewable tablet Chew 2 tablets by mouth 2 (two) times daily.       Marland Kitchen dexamethasone (DECADRON) 4 MG tablet Take 5 tabs = 20 mg 12 hrs. And 6 hrs. with food  prior to taxol chemotherapy.  20 tablet  1  . LORazepam (ATIVAN) 1 MG tablet Take 1/2 to 1 tablet under the tongue or swallow  every 4-6 hours as needed for nausea.  Will relax you and make you drowsy.  20 tablet  0  . Multiple Vitamin (MULTIVITAMIN) tablet Take 1 tablet by mouth daily.       . Omega-3 Fatty Acids (FISH OIL) 1200 MG CAPS Take 1 capsule by mouth daily.       . ondansetron (ZOFRAN) 8 MG tablet Take 1-2 tablets (8-16 mg total) by mouth every 8 (eight) hours as needed for nausea.  30 tablet  2  . polyethylene glycol (MIRALAX / GLYCOLAX) packet Take 17 g by mouth 2 (two) times daily as needed.      . sennosides-docusate sodium (SENOKOT-S) 8.6-50 MG tablet Take 1-2 tablets by mouth 2 (two) times daily as needed for constipation.      . ciprofloxacin (CIPRO) 250 MG tablet Take 1 tablet (250 mg total) by mouth 2 (two) times daily. While white blood cells low.  10 tablet  0   No facility-administered encounter medications on file as of 04/03/2013.    Allergy: No Known Allergies  Social Hx:   History   Social History  . Marital Status: Married    Spouse Name: N/A    Number of Children: N/A  . Years of Education: N/A   Occupational History  . Not on file.   Social History Main Topics  . Smoking status: Never Smoker   . Smokeless tobacco: Never Used  . Alcohol Use: No  . Drug Use: No  . Sexually Active: Yes     Comment: menarche age 53, G71,  P37, menopause 2006   Other  Topics Concern  . Not on file   Social History Narrative  . No narrative on file    Past Surgical Hx:  Past Surgical History  Procedure Laterality Date  . Rhinoplasty      breathing problems- Dr Haroldine Laws  . Robotic assisted total hysterectomy with bilateral salpingo oopherectomy N/A 02/12/2013    Procedure: ROBOTIC ASSISTED TOTAL HYSTERECTOMY WITH BILATERAL SALPINGO OOPHORECTOMY,  LYMPH NODES;  Surgeon: Rejeana Brock A. Duard Brady, MD;  Location: [facility];  Service: Gynecology;  Laterality: N/A;  . Abdominal hysterectomy      Past Medical Hx:  Past Medical History  Diagnosis Date  . Migraine   . Hyperlipidemia     borderline high  cholesterol  . Skin lesion     non maligant skin lesions on head  . Cancer 02/12/13    endometrial, positive nodes    Past Gynecological History:  Gravida 1 para 1.  Menarche occurred at the age of 71 with regular menses until menopause in 2016. She reports an abnormal Pap test either in her 30s or 40s. Her last Pap test was in 2012 all within normal limits. She reports use of oral contraceptive pills for approximately 6 years.   Family Hx:  Family History  Problem Relation Age of Onset  . Diabetes Mother   . Kidney disease Mother   . Benign prostatic hyperplasia Father   . Diabetes Maternal Grandmother      Physical exam:  Please refer to at that for vital signs.  Gen.: Well-nourished well-developed female in no acute distress.  Abdomen: Well-healed surgical incisions. Abdomen is soft and nontender.  Pelvic: Normal external female genitalia. Vagina is atrophic. Vaginal cuff is well-healed. Bimanual examination reveals no masses nodularity or tenderness.  Assessment:  Patient is a 52 year old with a stage IIIc serous carcinoma the endometrium. She has completed her first cycle of chemotherapy with Dr. Darrold Span. She'll be treated with paclitaxel and carboplatin x3 followed by radiation therapy with Dr. Roselind Messier followed by the conclusion of her chemotherapy. She knows that we are here at any time that she needs [place] but that will most likely not see her after she completes her adjuvant therapy.    Thompson Caul., MD, PhD 04/03/2013, 9:36 AM

## 2013-04-03 NOTE — Patient Instructions (Signed)
Hysterectomy Care After These instructions give you information on caring for yourself after your procedure. Your doctor may also give you more specific instructions. Call your doctor if you have any problems or questions after your procedure. HOME CARE Healing takes time. You may have discomfort, tenderness, puffiness (swelling), and bruising at the wound site. This may last for 2 weeks. This is normal and will get better.  Only take medicine as told by your doctor.  Do not take aspirin.  Do not drive when taking pain medicine.  Exercise, lift objects, drive, and get back to daily activites as told by your doctor.  Get back to your normal diet and activities as told by your doctor.  Get plenty of rest and sleep.  Do not douche, use tampons, or have sex (intercourse) for at least 6 weeks or as told.  Change your bandages (dressings) as told by your doctor.  Take your temperature during the day.  Take showers for 2 to 3 weeks. Do not take baths.  Do not drink alcohol until your doctor says it is okay.  Take a medicine to help you poop (laxative) as told by your doctor. Try eating bran foods. Drink enough fluids to keep your pee (urine) clear or pale yellow.  Have someone help you at home for 1 to 2 weeks after your surgery.  Keep follow-up doctor visits as told. GET HELP RIGHT AWAY IF:   You have a fever.  You have bad belly (abdominal) pain.  You have chest pain.  You are short of breath.  You pass out (faint).  You have pain, puffiness, or redness of your leg.  You bleed a lot from your vagina and notice clumps of tissue (clots).  You have puffiness, redness, or pain where a tube was put in your vein (IV) or in the wound area.  You have yellowish-white fluid (pus) coming from the wound.  You have a bad smell coming from the wound or bandage.  Your wound pulls apart.  You feel dizzy or lightheaded.  You have pain or bleeding when you pee.  You keep having  watery poop (diarrhea).  You keep feeling sick to your stomach (nauseous) or keep throwing up (vomiting).  You have fluid (discharge) coming from your vagina.  You have a rash.  You have a reaction to your medicine.  You need stronger pain medicine. MAKE SURE YOU:  Understand these instructions.  Will watch your condition.  Will get help right away if you are not doing well or get worse. Document Released: 08/30/2008 Document Revised: 02/13/2012 Document Reviewed: 07/08/2011 Exit[employer] Patient Information 2013 [employer].  

## 2013-04-05 ENCOUNTER — Ambulatory Visit (HOSPITAL_BASED_OUTPATIENT_CLINIC_OR_DEPARTMENT_OTHER): Payer: 59

## 2013-04-05 VITALS — BP 120/74 | HR 97 | Temp 98.5°F | Resp 20

## 2013-04-05 DIAGNOSIS — Z5111 Encounter for antineoplastic chemotherapy: Secondary | ICD-10-CM

## 2013-04-05 DIAGNOSIS — C541 Malignant neoplasm of endometrium: Secondary | ICD-10-CM

## 2013-04-05 DIAGNOSIS — C549 Malignant neoplasm of corpus uteri, unspecified: Secondary | ICD-10-CM

## 2013-04-05 MED ORDER — DEXAMETHASONE SODIUM PHOSPHATE 20 MG/5ML IJ SOLN
20.0000 mg | Freq: Once | INTRAMUSCULAR | Status: AC
  Administered 2013-04-05: 20 mg via INTRAVENOUS

## 2013-04-05 MED ORDER — DIPHENHYDRAMINE HCL 50 MG/ML IJ SOLN
50.0000 mg | Freq: Once | INTRAMUSCULAR | Status: AC
  Administered 2013-04-05: 50 mg via INTRAVENOUS

## 2013-04-05 MED ORDER — SODIUM CHLORIDE 0.9 % IV SOLN
Freq: Once | INTRAVENOUS | Status: AC
  Administered 2013-04-05: 09:00:00 via INTRAVENOUS

## 2013-04-05 MED ORDER — SODIUM CHLORIDE 0.9 % IV SOLN
690.0000 mg | Freq: Once | INTRAVENOUS | Status: AC
  Administered 2013-04-05: 690 mg via INTRAVENOUS
  Filled 2013-04-05: qty 69

## 2013-04-05 MED ORDER — ONDANSETRON 16 MG/50ML IVPB (CHCC)
16.0000 mg | Freq: Once | INTRAVENOUS | Status: AC
  Administered 2013-04-05: 16 mg via INTRAVENOUS

## 2013-04-05 MED ORDER — PACLITAXEL CHEMO INJECTION 300 MG/50ML
175.0000 mg/m2 | Freq: Once | INTRAVENOUS | Status: AC
  Administered 2013-04-05: 342 mg via INTRAVENOUS
  Filled 2013-04-05: qty 57

## 2013-04-05 MED ORDER — FAMOTIDINE IN NACL 20-0.9 MG/50ML-% IV SOLN
20.0000 mg | Freq: Once | INTRAVENOUS | Status: AC
  Administered 2013-04-05: 20 mg via INTRAVENOUS

## 2013-04-05 NOTE — Patient Instructions (Signed)
[facility] Discharge Instructions for Patients Receiving Chemotherapy  Today you received the following chemotherapy agents: Taxol and Carboplatin. To help prevent nausea and vomiting after your treatment, we encourage you to take your nausea medication.   If you develop nausea and vomiting that is not controlled by your nausea medication, call the clinic.   BELOW ARE SYMPTOMS THAT SHOULD BE REPORTED IMMEDIATELY:  *FEVER GREATER THAN 100.5 F  *CHILLS WITH OR WITHOUT FEVER  NAUSEA AND VOMITING THAT IS NOT CONTROLLED WITH YOUR NAUSEA MEDICATION  *UNUSUAL SHORTNESS OF BREATH  *UNUSUAL BRUISING OR BLEEDING  TENDERNESS IN MOUTH AND THROAT WITH OR WITHOUT PRESENCE OF ULCERS  *URINARY PROBLEMS  *BOWEL PROBLEMS  UNUSUAL RASH Items with * indicate a potential emergency and should be followed up as soon as possible.  The clinic phone number is [phone].   I

## 2013-04-08 ENCOUNTER — Ambulatory Visit (HOSPITAL_BASED_OUTPATIENT_CLINIC_OR_DEPARTMENT_OTHER): Payer: 59

## 2013-04-08 VITALS — BP 123/89 | HR 115 | Temp 98.4°F

## 2013-04-08 DIAGNOSIS — Z5189 Encounter for other specified aftercare: Secondary | ICD-10-CM

## 2013-04-08 DIAGNOSIS — C541 Malignant neoplasm of endometrium: Secondary | ICD-10-CM

## 2013-04-08 DIAGNOSIS — C549 Malignant neoplasm of corpus uteri, unspecified: Secondary | ICD-10-CM

## 2013-04-08 MED ORDER — PEGFILGRASTIM INJECTION 6 MG/0.6ML
6.0000 mg | Freq: Once | SUBCUTANEOUS | Status: AC
  Administered 2013-04-08: 6 mg via SUBCUTANEOUS
  Filled 2013-04-08: qty 0.6

## 2013-04-08 NOTE — Patient Instructions (Addendum)

## 2013-04-17 ENCOUNTER — Other Ambulatory Visit (HOSPITAL_BASED_OUTPATIENT_CLINIC_OR_DEPARTMENT_OTHER): Payer: 59 | Admitting: Lab

## 2013-04-17 ENCOUNTER — Telehealth: Payer: Self-pay | Admitting: Oncology

## 2013-04-17 ENCOUNTER — Ambulatory Visit (HOSPITAL_BASED_OUTPATIENT_CLINIC_OR_DEPARTMENT_OTHER): Payer: 59 | Admitting: Oncology

## 2013-04-17 ENCOUNTER — Encounter: Payer: Self-pay | Admitting: Oncology

## 2013-04-17 VITALS — BP 132/85 | HR 96 | Temp 98.4°F | Resp 18 | Ht 64.84 in | Wt 184.0 lb

## 2013-04-17 DIAGNOSIS — C549 Malignant neoplasm of corpus uteri, unspecified: Secondary | ICD-10-CM

## 2013-04-17 DIAGNOSIS — C541 Malignant neoplasm of endometrium: Secondary | ICD-10-CM

## 2013-04-17 LAB — CBC WITH DIFFERENTIAL/PLATELET
BASO%: 0.9 % (ref 0.0–2.0)
LYMPH%: 17.4 % (ref 14.0–49.7)
MCHC: 33.4 g/dL (ref 31.5–36.0)
MCV: 85.6 fL (ref 79.5–101.0)
MONO#: 1 10*3/uL — ABNORMAL HIGH (ref 0.1–0.9)
MONO%: 10 % (ref 0.0–14.0)
Platelets: 154 10*3/uL (ref 145–400)
RBC: 3.8 10*6/uL (ref 3.70–5.45)
WBC: 10.2 10*3/uL (ref 3.9–10.3)

## 2013-04-17 LAB — COMPREHENSIVE METABOLIC PANEL (CC13)
ALT: 22 U/L (ref 0–55)
Alkaline Phosphatase: 137 U/L (ref 40–150)
Sodium: 141 mEq/L (ref 136–145)
Total Bilirubin: <0.2 mg/dL (ref 0.20–1.20)
Total Protein: 6.8 g/dL (ref 6.4–8.3)

## 2013-04-17 NOTE — Progress Notes (Signed)
OFFICE PROGRESS NOTE   04/17/2013   Physicians:P.Duard Brady;J.Kinard, Tower, Audrie Gallus, MD (PCP), K.Fogleman, A.Jordan, M.Magod, [facility]   INTERVAL HISTORY:   Patient is seen, alone for visit, in continuing attention to IIIC serous endometrial cancer for which she is receiving adjuvant taxol/ carboplatin, cycle 2 given 04-05-13 (q 3 week dosing) with neulasta 04-08-13. She was very fatigued and had aches from the taxol + neulasta for first week, but is feeling better now. She needed no nausea medication after day 5, is eating and drinking fluids well now. Bowels have been moving and she does not have peripheral neuropathy symptoms. She saw Dr Duard Brady on 04-03-13, doing well from her standpoint and will see her again after completion of adjuvant therapy, that appointment not yet scheduled. Patient does not have PAC.   Oncologic History  Patient had been in usual very good health until vaginal bleeding beginning 11-28-12. She was seen in January by PCP Dr Milinda Antis, who had done her gyn exams previously, and sent to Dr Kirtland Bouchard.Fogleman. 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 She saw Dr Nelly Rout on 02-21-13 and was taken to total robotic hysterectomy/BSO/bilateral pelvic nodes, with frozen section showing grade 1 tumor with minimal myometrial involvement, such that paraaortic nodes were not evaluated. She was hospitalized just 1.5 days and required no pain medication after DC home. Final pathology ([id number]), however, showed invasive with a well differentiated component and a high grade serous component. total size was 4.2 cm and invasion 0.3 cm where myometrium 1.8 cm thick; the serous component had LVSI and 3 of 7 pelvic nodes were involved with the serous component. She saw Dr Duard Brady postoperatively on 02-21-13, recovering well from the surgery. She had CT CAP on 02-25-13 with no findings of  concern in the chest and no retroperitoneal adenopathy or other findings of concern for metastatic disease. She has seen Dr Roselind Messier in consultation, with recommendation for IMRT after 3 cycles of chemotherapy and vaginal vault brachytherapy during the final 3 cycles of chemotherapy.    ROS as above, otherwise no bleeding, no pain now, no fever or symptoms of infection, is able to sleep. Peripheral IV access has been very adequate. Remainder of 10 point Review of Systems negative.  Objective:  Vital signs in last 24 hours:  BP 132/85  Pulse 96  Temp(Src) 98.4 F (36.9 C) (Oral)  Resp 18  Ht 5' 4.84" (1.647 m)  Wt 184 lb (83.462 kg)  BMI 30.77 kg/m2 Weight is up 4 lbs. Easily mobile, NAD, a little more talkative today and not tearful.    HEENT:PERRLA, extra ocular movement intact, sclera clear, anicteric and oropharynx clear, no lesions Alopecia LymphaticsCervical, supraclavicular, and axillary nodes normal. No inguinal adenopathy Resp: clear to auscultation bilaterally and normal percussion bilaterally Cardio: regular rate and rhythm GI: soft, non-tender; bowel sounds normal; no masses,  no organomegaly Extremities: extremities normal, atraumatic, no cyanosis or edema Neuro:no sensory deficits noted Skin without rash or ecchymosis  Lab Results:  Results for orders placed in visit on 04/17/13  CBC WITH DIFFERENTIAL      Result Value Range   WBC 10.2  3.9 - 10.3 10e3/uL   NEUT# 7.3 (*) 1.5 - 6.5 10e3/uL   HGB 10.9 (*) 11.6 - 15.9 g/dL   HCT 84.1 (*) 32.4 - 40.1 %   Platelets 154  145 - 400 10e3/uL   MCV 85.6  79.5 - 101.0 fL  MCH 28.6  25.1 - 34.0 pg   MCHC 33.4  31.5 - 36.0 g/dL   RBC 1.61  0.96 - 0.45 10e6/uL   RDW 15.0 (*) 11.2 - 14.5 %   lymph# 1.8  0.9 - 3.3 10e3/uL   MONO# 1.0 (*) 0.1 - 0.9 10e3/uL   Eosinophils Absolute 0.0  0.0 - 0.5 10e3/uL   Basophils Absolute 0.1  0.0 - 0.1 10e3/uL   NEUT% 71.4  38.4 - 76.8 %   LYMPH% 17.4  14.0 - 49.7 %   MONO% 10.0  0.0  - 14.0 %   EOS% 0.3  0.0 - 7.0 %   BASO% 0.9  0.0 - 2.0 %  COMPREHENSIVE METABOLIC PANEL (CC13)      Result Value Range   Sodium 141  136 - 145 mEq/L   Potassium 3.9  3.5 - 5.1 mEq/L   Chloride 105  98 - 107 mEq/L   CO2 27  22 - 29 mEq/L   Glucose 110 (*) 70 - 99 mg/dl   BUN 9.0  7.0 - 40.9 mg/dL   Creatinine 0.7  0.6 - 1.1 mg/dL   Total Bilirubin <8.11 Repeated and Verified  0.20 - 1.20 mg/dL   Alkaline Phosphatase 137  40 - 150 U/L   AST 16  5 - 34 U/L   ALT 22  0 - 55 U/L   Total Protein 6.8  6.4 - 8.3 g/dL   Albumin 3.3 (*) 3.5 - 5.0 g/dL   Calcium 9.2  8.4 - 91.4 mg/dL     Studies/Results:  No results found.  Medications: I have reviewed the patient's current medications.She is in agreement with continuing chemotherapy and the neulasta "tho if you had asked me last week, I would have said that I would not take any more".  I will let Dr Roselind Messier know that cycle 3 chemotherapy is planned for 04-26-13, so that next appointments can be set up in radiation oncology.  Assessment/Plan: 1.IIIC high grade serous endometrial carcinoma: she will have CBC on 04-25-13, then cycle 3 taxol carbo as long as ANC is >=1.5 and plt >=100k,  with neulasta after chemo. I will see her again in early June or sooner if needed. Appointment back to Dr Roselind Messier requested. 2.Constipation: improved. She understands that she may have to increase senokot S after each chemotherapy treatment.  3.history of migraines, but tolerating zofran without difficulty.  Patient understands and is in agreement with plan.   Reece Packer, MD   04/17/2013, 4:38 PM

## 2013-04-17 NOTE — Telephone Encounter (Signed)
gv and printed appt sched and avs for pt...scheduled pt with Dr Roselind Messier for 5.26.14 @ 9:00am

## 2013-04-25 ENCOUNTER — Other Ambulatory Visit (HOSPITAL_BASED_OUTPATIENT_CLINIC_OR_DEPARTMENT_OTHER): Payer: 59 | Admitting: Lab

## 2013-04-25 ENCOUNTER — Telehealth: Payer: Self-pay

## 2013-04-25 DIAGNOSIS — C549 Malignant neoplasm of corpus uteri, unspecified: Secondary | ICD-10-CM

## 2013-04-25 DIAGNOSIS — C541 Malignant neoplasm of endometrium: Secondary | ICD-10-CM

## 2013-04-25 LAB — CBC WITH DIFFERENTIAL/PLATELET
BASO%: 0.3 % (ref 0.0–2.0)
Basophils Absolute: 0 10e3/uL (ref 0.0–0.1)
EOS%: 0.2 % (ref 0.0–7.0)
Eosinophils Absolute: 0 10e3/uL (ref 0.0–0.5)
HCT: 33.5 % — ABNORMAL LOW (ref 34.8–46.6)
HGB: 11.4 g/dL — ABNORMAL LOW (ref 11.6–15.9)
LYMPH%: 22.8 % (ref 14.0–49.7)
MCH: 29.6 pg (ref 25.1–34.0)
MCHC: 34 g/dL (ref 31.5–36.0)
MCV: 87 fL (ref 79.5–101.0)
MONO#: 0.9 10e3/uL (ref 0.1–0.9)
MONO%: 12.3 % (ref 0.0–14.0)
NEUT#: 4.7 10e3/uL (ref 1.5–6.5)
NEUT%: 64.4 % (ref 38.4–76.8)
Platelets: 283 10e3/uL (ref 145–400)
RBC: 3.84 10e6/uL (ref 3.70–5.45)
RDW: 16.2 % — ABNORMAL HIGH (ref 11.2–14.5)
WBC: 7.3 10e3/uL (ref 3.9–10.3)
lymph#: 1.7 10e3/uL (ref 0.9–3.3)

## 2013-04-25 NOTE — Telephone Encounter (Signed)
Told Jane Ruiz that her ANC was 4.7 and platelets are 283k so she is fine for her treatment tomorrow.  Pt. Verbalized understanding.

## 2013-04-26 ENCOUNTER — Other Ambulatory Visit: Payer: 59 | Admitting: Lab

## 2013-04-26 ENCOUNTER — Ambulatory Visit (HOSPITAL_BASED_OUTPATIENT_CLINIC_OR_DEPARTMENT_OTHER): Payer: 59

## 2013-04-26 VITALS — BP 142/82 | HR 99 | Temp 98.1°F | Resp 18

## 2013-04-26 DIAGNOSIS — C541 Malignant neoplasm of endometrium: Secondary | ICD-10-CM

## 2013-04-26 DIAGNOSIS — C549 Malignant neoplasm of corpus uteri, unspecified: Secondary | ICD-10-CM

## 2013-04-26 DIAGNOSIS — Z5111 Encounter for antineoplastic chemotherapy: Secondary | ICD-10-CM

## 2013-04-26 MED ORDER — ONDANSETRON 16 MG/50ML IVPB (CHCC)
16.0000 mg | Freq: Once | INTRAVENOUS | Status: AC
  Administered 2013-04-26: 16 mg via INTRAVENOUS

## 2013-04-26 MED ORDER — SODIUM CHLORIDE 0.9 % IV SOLN
720.5000 mg | Freq: Once | INTRAVENOUS | Status: AC
  Administered 2013-04-26: 720.5 mg via INTRAVENOUS
  Filled 2013-04-26: qty 72

## 2013-04-26 MED ORDER — FAMOTIDINE IN NACL 20-0.9 MG/50ML-% IV SOLN
20.0000 mg | Freq: Once | INTRAVENOUS | Status: AC
  Administered 2013-04-26: 20 mg via INTRAVENOUS

## 2013-04-26 MED ORDER — DIPHENHYDRAMINE HCL 50 MG/ML IJ SOLN
50.0000 mg | Freq: Once | INTRAMUSCULAR | Status: AC
  Administered 2013-04-26: 50 mg via INTRAVENOUS

## 2013-04-26 MED ORDER — SODIUM CHLORIDE 0.9 % IV SOLN
Freq: Once | INTRAVENOUS | Status: AC
  Administered 2013-04-26: 09:00:00 via INTRAVENOUS

## 2013-04-26 MED ORDER — DEXAMETHASONE SODIUM PHOSPHATE 20 MG/5ML IJ SOLN
20.0000 mg | Freq: Once | INTRAMUSCULAR | Status: AC
  Administered 2013-04-26: 20 mg via INTRAVENOUS

## 2013-04-26 MED ORDER — PACLITAXEL CHEMO INJECTION 300 MG/50ML
175.0000 mg/m2 | Freq: Once | INTRAVENOUS | Status: AC
  Administered 2013-04-26: 342 mg via INTRAVENOUS
  Filled 2013-04-26: qty 57

## 2013-04-26 NOTE — Patient Instructions (Signed)
[facility] Discharge Instructions for Patients Receiving Chemotherapy  Today you received the following chemotherapy agents :  Taxol,  Carboplatin.  To help prevent nausea and vomiting after your treatment, we encourage you to take your nausea medication as instructed by your physician.    If you develop nausea and vomiting that is not controlled by your nausea medication, call the clinic. If it is after clinic hours your family physician or the after hours number for the clinic or go to the Emergency Department.   BELOW ARE SYMPTOMS THAT SHOULD BE REPORTED IMMEDIATELY:  *FEVER GREATER THAN 100.5 F  *CHILLS WITH OR WITHOUT FEVER  NAUSEA AND VOMITING THAT IS NOT CONTROLLED WITH YOUR NAUSEA MEDICATION  *UNUSUAL SHORTNESS OF BREATH  *UNUSUAL BRUISING OR BLEEDING  TENDERNESS IN MOUTH AND THROAT WITH OR WITHOUT PRESENCE OF ULCERS  *URINARY PROBLEMS  *BOWEL PROBLEMS  UNUSUAL RASH Items with * indicate a potential emergency and should be followed up as soon as possible.  One of the nurses will contact you 24 hours after your treatment. Please let the nurse know about any problems that you may have experienced. Feel free to call the clinic you have any questions or concerns. The clinic phone number is [phone].   I have been informed and understand all the instructions given to me. I know to contact the clinic, my physician, or go to the Emergency Department if any problems should occur. I do not have any questions at this time, but understand that I may call the clinic during office hours   should I have any questions or need assistance in obtaining follow up care.    __________________________________________  _____________  __________ Signature of Patient or Authorized Representative            Date                   Time    __________________________________________ Nurse's Signature    

## 2013-04-27 ENCOUNTER — Other Ambulatory Visit: Payer: Self-pay | Admitting: *Deleted

## 2013-04-27 ENCOUNTER — Ambulatory Visit (HOSPITAL_BASED_OUTPATIENT_CLINIC_OR_DEPARTMENT_OTHER): Payer: 59

## 2013-04-27 ENCOUNTER — Other Ambulatory Visit: Payer: Self-pay | Admitting: Oncology

## 2013-04-27 VITALS — BP 133/76 | HR 91 | Temp 97.2°F

## 2013-04-27 DIAGNOSIS — Z5189 Encounter for other specified aftercare: Secondary | ICD-10-CM

## 2013-04-27 DIAGNOSIS — C549 Malignant neoplasm of corpus uteri, unspecified: Secondary | ICD-10-CM

## 2013-04-27 DIAGNOSIS — C541 Malignant neoplasm of endometrium: Secondary | ICD-10-CM

## 2013-04-27 MED ORDER — PEGFILGRASTIM INJECTION 6 MG/0.6ML
6.0000 mg | Freq: Once | SUBCUTANEOUS | Status: AC
  Administered 2013-04-27: 6 mg via SUBCUTANEOUS

## 2013-04-30 ENCOUNTER — Encounter: Payer: Self-pay | Admitting: *Deleted

## 2013-04-30 NOTE — Progress Notes (Signed)
GYN Location of Tumor / Histology: endometrium  Patient presented 5 months ago with symptoms of: post menopausal vaginal bleeding  Biopsies of uterus (if applicable) revealed: 1. Uterus +/- tubes/ovaries, neoplastic  - INVASIVE HIGH GRADE ENDOMETRIAL CARCINOMA, 4.2 CM, CONFINED WITHIN HALF OF THE MYOMETRIUM WITH ANGIOLYMPHATIC INVASION PRESENT. PLEASE SEE ONCOLOGY  TEMPLATE FOR DETAILS.  - CERVIX: BENIGN SQUAMOUS MUCOSA AND ENDOCERVICAL MUCOSA, NO DYSPLASIA OR MALIGNANCY.  - UTERINE SEROSA: ENDOMETRIOSIS AND ADHESIONS, NO EVIDENCE OF ATYPIA OR MALIGNANCY.  - BILATERAL OVARIES: BENIGN OVARIAN TISSUE WITH SEROSAL ADHESIONS, NO ATYPIA OR MALIGNANCY.  - BILATERAL FALLOPIAN TUBES: BENIGN FALLOPIAN TUBAL TISSUE WITH SEROSAL  ENDOMETRIOSIS AND PARATUBAL CYST. NO ATYPIA OR MALIGNANCY.  2. Lymph nodes, regional resection, right  - TWO OF FIVE LYMPH NODES, POSITIVE FOR METASTATIC CARCINOMA (2/5)  3. Lymph nodes, regional resection, left  - ONE OF TWO LYMPH NODES, POSITIVE FOR METASTATIC CARCINOMA (1/2).   Past/Anticipated interventions by Gyn/Onc surgery, if any: 02/12/13 robotic assisted total hysterectomy, BSO, lymph nodes  Past/Anticipated interventions by medical oncology, if any: adjuvant chemo Taxol/Carbo, q 3 weeks, s/p 3 cycles, last cycle 04/26/13. Pt to receive 3 more cycles during IMRT and vaginal HDR   Weight changes, if any: none  Bowel/Bladder complaints, if any: none  Nausea/Vomiting, if any: none  Pain issues, if any:  none  SAFETY ISSUES:  Prior radiation? no  Pacemaker/ICD? no  Possible current pregnancy? no  Is the patient on methotrexate? no  Current Complaints / other details:  Married, 1 daughter, Diplomatic Services operational officer at [employer]. Pt states she had chemo last Fri and "just doesn't feel well, is fatigued, weak."  Pt has chemo q 3 weeks. Pt denies pain, n/v, bladder/bowel issues, vaginal discharge, loss of appetite.

## 2013-05-01 ENCOUNTER — Ambulatory Visit
Admission: RE | Admit: 2013-05-01 | Discharge: 2013-05-01 | Disposition: A | Discharge status: Home / Self Care | Payer: 59 | Source: Ambulatory Visit | Attending: Radiation Oncology | Admitting: Radiation Oncology

## 2013-05-01 ENCOUNTER — Encounter: Payer: Self-pay | Admitting: Radiation Oncology

## 2013-05-01 VITALS — BP 120/61 | HR 119 | Temp 98.3°F | Resp 20 | Wt 178.2 lb

## 2013-05-01 DIAGNOSIS — Z79899 Other long term (current) drug therapy: Secondary | ICD-10-CM | POA: Insufficient documentation

## 2013-05-01 DIAGNOSIS — C549 Malignant neoplasm of corpus uteri, unspecified: Secondary | ICD-10-CM | POA: Insufficient documentation

## 2013-05-01 DIAGNOSIS — Z9221 Personal history of antineoplastic chemotherapy: Secondary | ICD-10-CM | POA: Insufficient documentation

## 2013-05-01 DIAGNOSIS — C541 Malignant neoplasm of endometrium: Secondary | ICD-10-CM

## 2013-05-01 NOTE — Progress Notes (Signed)
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 She has recently completed her third cycle of chemotherapy and is now ready to begin her pelvic radiation therapy as part of her overall management.  She seems to have tolerated her chemotherapy reasonably well.  She denies any vaginal bleeding or discharge.                          ALLERGIES:  has No Known Allergies.  Meds: Current Outpatient Prescriptions  Medication Sig Dispense Refill  . atorvastatin (LIPITOR) 10 MG tablet Take 1 tablet (10 mg total) by mouth every evening.  90 tablet  1  . calcium carbonate (ANTACID EXTRA STRENGTH) 750 MG chewable tablet Chew 2 tablets by mouth 2 (two) times daily.       Marland Kitchen dexamethasone (DECADRON) 4 MG tablet Take 5 tabs = 20 mg 12 hrs. And 6 hrs. with food  prior to taxol chemotherapy.  20 tablet  1  . LORazepam (ATIVAN) 1 MG tablet Take 1/2 to 1 tablet under the tongue or swallow every 4-6 hours as needed for nausea.  Will relax you and make you drowsy.  20 tablet  0  . Multiple Vitamin (MULTIVITAMIN) tablet Take 1 tablet by mouth daily.       . Omega-3 Fatty Acids (FISH OIL) 1200 MG CAPS Take 1 capsule by mouth daily.       . ondansetron (ZOFRAN) 8 MG tablet Take 1-2 tablets (8-16 mg total) by mouth every 8 (eight) hours as needed for nausea.  30 tablet  2  . polyethylene glycol (MIRALAX / GLYCOLAX) packet Take 17 g by mouth 2 (two) times daily as needed.      . sennosides-docusate sodium (SENOKOT-S) 8.6-50 MG tablet Take 1-2 tablets by mouth 2 (two) times daily as needed for constipation.       No current facility-administered medications for this encounter.    Physical Findings: The patient  is in no acute distress. Patient is alert and oriented.  weight is 178 lb 3.2 oz (80.831 kg). Her oral temperature is 98.3 F (36.8 C). Her blood pressure is 120/61 and her pulse is 119. Her respiration is 20. Marland Kitchen No palpable supraclavicular or axillary adenopathy. The lungs are clear to auscultation. The heart has a regular rhythm and rate.  The abdomen is soft and nontender with normal bowel sounds. There is no inguinal adenopathy. On pelvic examination the external genitalia are unremarkable. A speculum exam is performed. The vaginal cuff is well-healed without mucosal lesions. On bimanual examination there no pelvic masses appreciated.  Lab Findings: Lab Results  Component Value Date   WBC 7.3 04/25/2013   HGB 11.4* 04/25/2013   HCT 33.5* 04/25/2013   MCV 87.0 04/25/2013   PLT 283 04/25/2013      Radiographic Findings: No results found.  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  The patient is now ready to proceed with planning for her pelvic radiation therapy. She will receive 45 Gy directed at the pelvis area followed by intracavitary brachytherapy treatments, given the high-grade nature of the tumor and the presence of angiolymphatic invasion.   Plan:  Simulation and planning next week  _____________________________________  -----------------------------------  Billie Lade, PhD, MD

## 2013-05-01 NOTE — Progress Notes (Signed)
Please see the Nurse Progress Note in the MD Initial Consult Encounter for this patient. 

## 2013-05-07 ENCOUNTER — Ambulatory Visit
Admission: RE | Admit: 2013-05-07 | Discharge: 2013-05-07 | Disposition: A | Discharge status: Home / Self Care | Payer: 59 | Source: Ambulatory Visit | Attending: Radiation Oncology | Admitting: Radiation Oncology

## 2013-05-07 VITALS — BP 137/84 | HR 89 | Temp 98.1°F | Resp 20

## 2013-05-07 DIAGNOSIS — C549 Malignant neoplasm of corpus uteri, unspecified: Secondary | ICD-10-CM | POA: Insufficient documentation

## 2013-05-07 DIAGNOSIS — R5381 Other malaise: Secondary | ICD-10-CM | POA: Insufficient documentation

## 2013-05-07 DIAGNOSIS — Z510 Encounter for antineoplastic radiation therapy: Secondary | ICD-10-CM | POA: Insufficient documentation

## 2013-05-07 DIAGNOSIS — C541 Malignant neoplasm of endometrium: Secondary | ICD-10-CM

## 2013-05-07 DIAGNOSIS — R197 Diarrhea, unspecified: Secondary | ICD-10-CM | POA: Insufficient documentation

## 2013-05-07 DIAGNOSIS — Z79899 Other long term (current) drug therapy: Secondary | ICD-10-CM | POA: Insufficient documentation

## 2013-05-07 MED ORDER — SODIUM CHLORIDE 0.9 % IJ SOLN
10.0000 mL | Freq: Once | INTRAMUSCULAR | Status: AC
  Administered 2013-05-07: 10 mL via INTRAVENOUS

## 2013-05-09 NOTE — Progress Notes (Signed)
  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  Identity was confirmed.  All relevant records and images related to the planned course of therapy were reviewed.  The patient freely provided informed written consent to proceed with treatment after reviewing the details related to the planned course of therapy. The consent form was witnessed and verified by the simulation staff.  Then, the patient was set-up in a stable reproducible  supine position for radiation therapy.  CT images were obtained.  Surface markings were placed.  The CT images were loaded into the planning software.  Then the target and avoidance structures were contoured.  Treatment planning then occurred.  The radiation prescription was entered and confirmed.  Then, I designed and supervised the construction of a total of 1 medically necessary complex treatment devices.  I have requested : Intensity Modulated Radiotherapy (IMRT) is medically necessary for this case for the following reason:  Small bowel sparing..  I have ordered:dose calc.  PLAN:  The patient will receive 45 Gy in 25 fractions, followed by intracavitary brachytherapy treatments totaling 3 on a weekly basis.  ________________________________  -----------------------------------  Billie Lade, PhD, MD

## 2013-05-15 ENCOUNTER — Encounter: Payer: Self-pay | Admitting: Oncology

## 2013-05-15 ENCOUNTER — Other Ambulatory Visit (HOSPITAL_BASED_OUTPATIENT_CLINIC_OR_DEPARTMENT_OTHER): Payer: 59 | Admitting: Lab

## 2013-05-15 ENCOUNTER — Other Ambulatory Visit: Payer: Self-pay

## 2013-05-15 ENCOUNTER — Telehealth: Payer: Self-pay | Admitting: *Deleted

## 2013-05-15 ENCOUNTER — Ambulatory Visit (HOSPITAL_BASED_OUTPATIENT_CLINIC_OR_DEPARTMENT_OTHER): Payer: 59 | Admitting: Oncology

## 2013-05-15 ENCOUNTER — Telehealth: Payer: Self-pay | Admitting: Oncology

## 2013-05-15 VITALS — BP 118/78 | HR 104 | Temp 97.9°F | Resp 18 | Ht 64.8 in | Wt 186.3 lb

## 2013-05-15 DIAGNOSIS — M79609 Pain in unspecified limb: Secondary | ICD-10-CM

## 2013-05-15 DIAGNOSIS — C541 Malignant neoplasm of endometrium: Secondary | ICD-10-CM

## 2013-05-15 DIAGNOSIS — R5383 Other fatigue: Secondary | ICD-10-CM

## 2013-05-15 DIAGNOSIS — R110 Nausea: Secondary | ICD-10-CM

## 2013-05-15 DIAGNOSIS — R5381 Other malaise: Secondary | ICD-10-CM

## 2013-05-15 DIAGNOSIS — C549 Malignant neoplasm of corpus uteri, unspecified: Secondary | ICD-10-CM

## 2013-05-15 LAB — CBC WITH DIFFERENTIAL/PLATELET
BASO%: 0.2 % (ref 0.0–2.0)
EOS%: 0.3 % (ref 0.0–7.0)
MCH: 29.4 pg (ref 25.1–34.0)
MCHC: 33.2 g/dL (ref 31.5–36.0)
MCV: 88.6 fL (ref 79.5–101.0)
MONO%: 11.6 % (ref 0.0–14.0)
RBC: 3.67 10*6/uL — ABNORMAL LOW (ref 3.70–5.45)
RDW: 17.2 % — ABNORMAL HIGH (ref 11.2–14.5)
lymph#: 2 10*3/uL (ref 0.9–3.3)

## 2013-05-15 LAB — BASIC METABOLIC PANEL (CC13)
BUN: 12 mg/dL (ref 7.0–26.0)
Creatinine: 0.8 mg/dL (ref 0.6–1.1)

## 2013-05-15 MED ORDER — POLYETHYLENE GLYCOL 3350 17 GM/SCOOP PO POWD: 17.0000 g | Freq: Two times a day (BID) | ORAL | Status: DC | PRN

## 2013-05-15 MED ORDER — DEXAMETHASONE 4 MG PO TABS: ORAL_TABLET | ORAL | Status: DC

## 2013-05-15 NOTE — Patient Instructions (Signed)
Call if you need anything prior to next scheduled visit  [phone]  Take 2 senokot S twice daily beginning day of chemo. Add miralax (glycolax) 1 capful in a glass of any fluids daily if no bowel movement in 24 hours

## 2013-05-15 NOTE — Telephone Encounter (Signed)
Per staff message and POF I have scheduled appts.  JMW  

## 2013-05-15 NOTE — Progress Notes (Signed)
OFFICE PROGRESS NOTE   05/15/2013    Physicians:P.Duard Brady;J.Kinard, [facility], Audrie Gallus, MD (PCP), K.Fogleman, A.Jordan, M.Magod, [facility]  INTERVAL HISTORY:   Patient is seen, alone for visit, in continuing attention to adjuvant taxol carboplatin chemotherapy for IIIC serous endometrial cancer, which is being given in sandwich fashion with RT. Patient had 3 cycles of q 3 week taxol carbo from 03-15-13 thru 04-26-13, with neulasta day 2. She is to receive IMRT by Dr Roselind Messier from 05-16-13 thru 06-20-13, then final 3 cycles of chemotherapy. Cycle 3 chemotherapy was complicated by increased nausea all of first week, with difficulty taking adequate po fluids. She was very fatigued and again had significant aching especially in legs; the fatigue is improving and the leg aches have not completely resolved yet. We have discussed adjusting antiemetics with chemotherapy administration and adding IVF when she comes for neulasta after treatment. She does not have PAC  Oncologic History  Patient had been in usual very good health until vaginal bleeding beginning 11-28-12. She was seen in January by PCP Dr Milinda Antis, who had done her gyn exams previously, and sent to Dr Kirtland Bouchard.Fogleman. 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 She saw Dr Nelly Rout on 02-21-13 and was taken to total robotic hysterectomy/BSO/bilateral pelvic nodes, with frozen section showing grade 1 tumor with minimal myometrial involvement, such that paraaortic nodes were not evaluated. She was hospitalized just 1.5 days and required no pain medication after DC home. Final pathology ([id number]), however, showed invasive with a well differentiated component and a high grade serous component. total size was 4.2 cm and invasion 0.3 cm where myometrium 1.8 cm thick; the serous component had LVSI and 3 of 7 pelvic nodes were involved with the  serous component. She saw Dr Duard Brady postoperatively on 02-21-13, recovering well from the surgery. She had CT CAP on 02-25-13 with no findings of concern in the chest and no retroperitoneal adenopathy or other findings of concern for metastatic disease. She has seen Dr Roselind Messier in consultation, with recommendation for IMRT after 3 cycles of chemotherapy and vaginal vault brachytherapy during the final 3 cycles of chemotherapy.    Bowels moving adequately now. She used Senokot S for constipation after cycle 3 and we have discussed increasing dose and adding prn miralax. No fever or symptoms of infection. No SOB or respiratory symptoms. No abdominal or pelvic pain. She has been able to work since last week. No peripheral neuropathy. Remainder of 10 point Review of Systems negative.  Objective:  Vital signs in last 24 hours:  BP 118/78  Pulse 104  Temp(Src) 97.9 F (36.6 C) (Oral)  Resp 18  Ht 5' 4.8" (1.646 m)  Wt 186 lb 4.8 oz (84.505 kg)  BMI 31.19 kg/m2  Easily ambulatory, NAD. Alopecia.   HEENT:PERRLA, sclera clear, anicteric and oropharynx clear, no lesions LymphaticsCervical, supraclavicular, and axillary nodes normal. No inguinal adenopathy Resp: clear to auscultation bilaterally and normal percussion bilaterally Cardio: regular rate and rhythm GI: soft, non-tender; bowel sounds normal; no masses,  no organomegaly. Robotic surgical incisions entirely healed. Extremities: extremities normal, atraumatic, no cyanosis or edema Neuro:no sensory deficits noted Breast:normal without suspicious masses, skin or nipple changes or axillary nodes Skin without rash or ecchymosis Lab Results:  Results for orders placed in visit on 05/15/13  CBC WITH DIFFERENTIAL      Result Value Range   WBC 10.6 (*) 3.9 - 10.3 10e3/uL   NEUT# 7.3 (*)  1.5 - 6.5 10e3/uL   HGB 10.8 (*) 11.6 - 15.9 g/dL   HCT 09.8 (*) 11.9 - 14.7 %   Platelets 104 (*) 145 - 400 10e3/uL   MCV 88.6  79.5 - 101.0 fL   MCH 29.4   25.1 - 34.0 pg   MCHC 33.2  31.5 - 36.0 g/dL   RBC 8.29 (*) 5.62 - 1.30 10e6/uL   RDW 17.2 (*) 11.2 - 14.5 %   lymph# 2.0  0.9 - 3.3 10e3/uL   MONO# 1.2 (*) 0.1 - 0.9 10e3/uL   Eosinophils Absolute 0.0  0.0 - 0.5 10e3/uL   Basophils Absolute 0.0  0.0 - 0.1 10e3/uL   NEUT% 69.0  38.4 - 76.8 %   LYMPH% 18.9  14.0 - 49.7 %   MONO% 11.6  0.0 - 14.0 %   EOS% 0.3  0.0 - 7.0 %   BASO% 0.2  0.0 - 2.0 %  BASIC METABOLIC PANEL (CC13)      Result Value Range   Sodium 139  136 - 145 mEq/L   Potassium 3.9  3.5 - 5.1 mEq/L   Chloride 105  98 - 107 mEq/L   CO2 27  22 - 29 mEq/L   Glucose 119 (*) 70 - 99 mg/dl   BUN 86.5  7.0 - 78.4 mg/dL   Creatinine 0.8  0.6 - 1.1 mg/dL   Calcium 9.8  8.4 - 69.6 mg/dL     Studies/Results:  No results found. Patient understands that scans will be repeated after chemo/RT completed, shortly prior to return visit to Dr Duard Brady.  Medications: I have reviewed the patient's current medications. Will add EMEND to further chemotherapy cycles : premedications changed to include this for cycle 4, orders not yet signed Will try IVF day of neulasta after next chemo : those orders placed now for 7-26 as sign and hold, will need to move if date changes Neulasta order placed for 7-26 but not signed now.   Assessment/Plan:  1.IIIC high grade serous endometrial carcinoma: history as above with 3 cycles of q 3 week taxol carboplatin given from 03-15-13 thru 04-26-13. She will begin IMRT tomorrow, that planned thru ~ 06-20-13. She will be seen back at this office during last week of RT (or sooner if needed), and is tentatively scheduled for cycle 4 chemo on 06-28-13, with neulasta + IV NS on 06-29-13. She will have total of 6 cycles of chemo, with vaginal vault brachytherapy during the last 3 cycles. She will need repeat CT AP and return visit to Dr Duard Brady after treatment is completed. 2.Constipation: improved. She understands that she may have to increase senokot S after each  chemotherapy treatment.  3. Increased nausea with most recent chemo: add EMEND to premeds here, and IVF in addition to neulasta day 2. See comments above for status of orders. 4.history of migraines, but tolerating zofran without difficulty.  Patient understands and is in agreement with plan, and expresses appreciation for care.  Reece Packer, MD   05/15/2013, 2:46 PM

## 2013-05-16 ENCOUNTER — Ambulatory Visit: Payer: 59 | Admitting: Radiation Oncology

## 2013-05-17 ENCOUNTER — Other Ambulatory Visit: Payer: 59

## 2013-05-17 ENCOUNTER — Telehealth: Payer: Self-pay | Admitting: Oncology

## 2013-05-17 ENCOUNTER — Ambulatory Visit: Payer: 59

## 2013-05-17 NOTE — Telephone Encounter (Signed)
Moved 7/14 appt to covering provider due to LL's departure. lmonvm for pt re new time for 7/14 @ 10:30am but then realized pt has xrt daily @ 1:40pm. Moved appt from 7/14 to 7/15 @ 1pm and lmonvm a 2nd time for pt re change and new d/t for 7/15 @ 1pm. Lm on cell could not reach pt at home line busy.

## 2013-05-20 ENCOUNTER — Ambulatory Visit: Payer: 59

## 2013-05-21 ENCOUNTER — Ambulatory Visit: Payer: 59

## 2013-05-22 ENCOUNTER — Ambulatory Visit: Payer: 59

## 2013-05-23 ENCOUNTER — Ambulatory Visit: Payer: 59

## 2013-05-24 ENCOUNTER — Ambulatory Visit: Payer: 59

## 2013-05-24 ENCOUNTER — Encounter: Payer: 59 | Admitting: Family Medicine

## 2013-05-27 ENCOUNTER — Ambulatory Visit
Admission: RE | Admit: 2013-05-27 | Discharge: 2013-05-27 | Disposition: A | Discharge status: Home / Self Care | Payer: 59 | Source: Ambulatory Visit | Attending: Radiation Oncology | Admitting: Radiation Oncology

## 2013-05-27 ENCOUNTER — Encounter: Payer: Self-pay | Admitting: Radiation Oncology

## 2013-05-27 NOTE — Progress Notes (Signed)
   Department of Radiation Oncology  Phone:  [phone] Fax:        [phone]   IMRT device note  Today the patient began her radiation therapy directed at the pelvis and had development of her IMRT device for helical intensity modulated radiation therapy. The patient will be treated with 19.5 sinogram segments. This constitutes 1 IMRT device.  -----------------------------------  Billie Lade, PhD, MD

## 2013-05-28 ENCOUNTER — Encounter: Payer: Self-pay | Admitting: Radiation Oncology

## 2013-05-28 ENCOUNTER — Ambulatory Visit
Admission: RE | Admit: 2013-05-28 | Discharge: 2013-05-28 | Disposition: A | Discharge status: Home / Self Care | Payer: 59 | Source: Ambulatory Visit | Attending: Radiation Oncology | Admitting: Radiation Oncology

## 2013-05-28 VITALS — BP 124/86 | HR 77 | Resp 18 | Wt 184.9 lb

## 2013-05-28 DIAGNOSIS — C541 Malignant neoplasm of endometrium: Secondary | ICD-10-CM

## 2013-05-28 NOTE — Progress Notes (Signed)
Denies burning with urination. Denies nausea or vomiting. Denies diarrhea or blood in stool. Denies hematuria. Denies pain. Denies skin changes within treatment field. Reports energy level is "ok and a little bit better since finishing chemotherapy."

## 2013-05-28 NOTE — Progress Notes (Signed)
[facility]    Radiation Oncology [address] [place]     Maryln Gottron, M.D. [place], [place] [address]               Billie Lade, M.D., Ph.D. Phone: [phone]      Molli Hazard A. Kathrynn Running, M.D. Fax: [phone]      Radene Gunning, M.D., Ph.D.         Lurline Hare, M.D.         Grayland Jack, M.D Weekly Treatment Management Note  Name: Jane Ruiz     MRN: [id number]        CSN: [id number] Date: 05/28/2013      DOB: 1961-02-05  CC: Jane Manns, MD         [facility]    Status: Outpatient  Diagnosis: The encounter diagnosis was Endometrial cancer.  Current Dose: 3.6 Gy  Current Fraction: 2  Planned Dose: 45 + Gy  Narrative: Jane Ruiz was seen today for weekly treatment management. The chart was checked and MVCT  were reviewed. She is tolerating her treatments well at this time without any side effects.  Review of patient's allergies indicates no known allergies.  Current Outpatient Prescriptions  Medication Sig Dispense Refill  . atorvastatin (LIPITOR) 10 MG tablet Take 1 tablet (10 mg total) by mouth every evening.  90 tablet  1  . calcium carbonate (ANTACID EXTRA STRENGTH) 750 MG chewable tablet Chew 2 tablets by mouth 2 (two) times daily.       Marland Kitchen LORazepam (ATIVAN) 1 MG tablet Take 1/2 to 1 tablet under the tongue or swallow every 4-6 hours as needed for nausea.  Will relax you and make you drowsy.  20 tablet  0  . Multiple Vitamin (MULTIVITAMIN) tablet Take 1 tablet by mouth daily.       . Omega-3 Fatty Acids (FISH OIL) 1200 MG CAPS Take 1 capsule by mouth daily.       Marland Kitchen dexamethasone (DECADRON) 4 MG tablet Take 5 tabs = 20 mg 12 hrs. And 6 hrs. with food  prior to taxol chemotherapy.  20 tablet  0  . ondansetron (ZOFRAN) 8 MG tablet Take 1-2 tablets (8-16 mg total) by mouth every 8 (eight) hours as needed for nausea.  30 tablet  2  . polyethylene glycol powder (GLYCOLAX/MIRALAX) powder Take 17 g by mouth 2 (two) times daily as needed.  255 g  1  .  sennosides-docusate sodium (SENOKOT-S) 8.6-50 MG tablet Take 1-2 tablets by mouth 2 (two) times daily as needed for constipation.       No current facility-administered medications for this encounter.   Labs:  Lab Results  Component Value Date   WBC 10.6* 05/15/2013   HGB 10.8* 05/15/2013   HCT 32.5* 05/15/2013   MCV 88.6 05/15/2013   PLT 104* 05/15/2013   Lab Results  Component Value Date   CREATININE 0.8 05/15/2013   BUN 12.0 05/15/2013   NA 139 05/15/2013   K 3.9 05/15/2013   CL 105 05/15/2013   CO2 27 05/15/2013   Lab Results  Component Value Date   ALT 22 04/17/2013   AST 16 04/17/2013   BILITOT <0.20 Repeated and Verified 04/17/2013    Physical Examination:  weight is 184 lb 14.4 oz (83.87 kg). Her blood pressure is 124/86 and her pulse is 77. Her respiration is 18.    Wt Readings from Last 3 Encounters:  05/28/13 184 lb 14.4 oz (83.87 kg)  05/15/13  186 lb 4.8 oz (84.505 kg)  05/01/13 178 lb 3.2 oz (80.831 kg)     Lungs - Normal respiratory effort, chest expands symmetrically. Lungs are clear to auscultation, no crackles or wheezes.  Heart has regular rhythm and rate  Abdomen is soft and non tender with normal bowel sounds  Assessment:  Patient tolerating treatments well  Plan: Continue treatment per original radiation prescription

## 2013-05-29 ENCOUNTER — Ambulatory Visit
Admission: RE | Admit: 2013-05-29 | Discharge: 2013-05-29 | Disposition: A | Discharge status: Home / Self Care | Payer: 59 | Source: Ambulatory Visit | Attending: Radiation Oncology | Admitting: Radiation Oncology

## 2013-05-30 ENCOUNTER — Ambulatory Visit
Admission: RE | Admit: 2013-05-30 | Discharge: 2013-05-30 | Disposition: A | Discharge status: Home / Self Care | Payer: 59 | Source: Ambulatory Visit | Attending: Radiation Oncology | Admitting: Radiation Oncology

## 2013-05-30 NOTE — Progress Notes (Signed)
Rip Harbour here for post sim ed.  She was given the Radiation Therapy and You booklet yesterday and has time to review it overnight.  We discussed the potential side effects of radiation including fatigue, diarrhea, skin changes, bladder changes and hair loss.  She was oriented to the clinic and educated about PUT with Dr. Sharol Roussel on Tuesday's.  An appointment calender was printed for her.  She was advised to contact nursing with any questions or concerns.

## 2013-05-31 ENCOUNTER — Ambulatory Visit
Admission: RE | Admit: 2013-05-31 | Discharge: 2013-05-31 | Disposition: A | Discharge status: Home / Self Care | Payer: 59 | Source: Ambulatory Visit | Attending: Radiation Oncology | Admitting: Radiation Oncology

## 2013-06-03 ENCOUNTER — Telehealth: Payer: Self-pay | Admitting: *Deleted

## 2013-06-03 ENCOUNTER — Ambulatory Visit
Admission: RE | Admit: 2013-06-03 | Discharge: 2013-06-03 | Disposition: A | Discharge status: Home / Self Care | Payer: 59 | Source: Ambulatory Visit | Attending: Radiation Oncology | Admitting: Radiation Oncology

## 2013-06-03 ENCOUNTER — Encounter: Payer: Self-pay | Admitting: Radiation Oncology

## 2013-06-03 VITALS — BP 121/61 | HR 89 | Temp 98.9°F | Wt 189.2 lb

## 2013-06-03 DIAGNOSIS — C541 Malignant neoplasm of endometrium: Secondary | ICD-10-CM

## 2013-06-03 NOTE — Progress Notes (Signed)
[facility]    Radiation Oncology [address] [place]     Maryln Gottron, M.D. [place], [place] [address]               Billie Lade, M.D., Ph.D. Phone: [phone]      Molli Hazard A. Kathrynn Running, M.D. Fax: [phone]      Radene Gunning, M.D., Ph.D.         Lurline Hare, M.D.         Grayland Jack, M.D Weekly Treatment Management Note  Name: Jane Ruiz     MRN: [id number]        CSN: [id number] Date: 06/03/2013      DOB: [birth year]  CC: [name], MD         [facility]    Status: Outpatient  Diagnosis: The encounter diagnosis was Endometrial cancer.  Current Dose: 10.8 Gy  Current Fraction: 6  Planned Dose: 45 Gy  Narrative: Jane Ruiz was seen today for weekly treatment management. The chart was checked and MVCT  were reviewed. She does notice some very mild intermittent nausea. Patient is also noticed some loose bowels but no diarrhea. She also has some fatigue.  Review of patient's allergies indicates no known allergies. Current Outpatient Prescriptions  Medication Sig Dispense Refill  . atorvastatin (LIPITOR) 10 MG tablet Take 1 tablet (10 mg total) by mouth every evening.  90 tablet  1  . calcium carbonate (ANTACID EXTRA STRENGTH) 750 MG chewable tablet Chew 2 tablets by mouth 2 (two) times daily.       Marland Kitchen dexamethasone (DECADRON) 4 MG tablet Take 5 tabs = 20 mg 12 hrs. And 6 hrs. with food  prior to taxol chemotherapy.  20 tablet  0  . LORazepam (ATIVAN) 1 MG tablet Take 1/2 to 1 tablet under the tongue or swallow every 4-6 hours as needed for nausea.  Will relax you and make you drowsy.  20 tablet  0  . Multiple Vitamin (MULTIVITAMIN) tablet Take 1 tablet by mouth daily.       . Omega-3 Fatty Acids (FISH OIL) 1200 MG CAPS Take 1 capsule by mouth daily.       . ondansetron (ZOFRAN) 8 MG tablet Take 1-2 tablets (8-16 mg total) by mouth every 8 (eight) hours as needed for nausea.  30 tablet  2  . polyethylene glycol powder (GLYCOLAX/MIRALAX) powder Take  17 g by mouth 2 (two) times daily as needed.  255 g  1  . sennosides-docusate sodium (SENOKOT-S) 8.6-50 MG tablet Take 1-2 tablets by mouth 2 (two) times daily as needed for constipation.       No current facility-administered medications for this encounter.   Labs:    Physical Examination:  weight is 189 lb 3.2 oz (85.821 kg). Her temperature is 98.9 F (37.2 C). Her blood pressure is 121/61 and her pulse is 89.    Wt Readings from Last 3 Encounters:  06/03/13 189 lb 3.2 oz (85.821 kg)  05/28/13 184 lb 14.4 oz (83.87 kg)  05/15/13 186 lb 4.8 oz (84.505 kg)     Lungs - Normal respiratory effort, chest expands symmetrically. Lungs are clear to auscultation, no crackles or wheezes.  Heart has regular rhythm and rate  Abdomen is soft and non tender with normal bowel sounds  Assessment:  Patient tolerating treatments well  Plan: Continue treatment per original radiation prescription

## 2013-06-03 NOTE — Telephone Encounter (Signed)
Discussing with MD.

## 2013-06-03 NOTE — Progress Notes (Signed)
Jane Ruiz has received 6 fractions to the pelvic region.  No voiced concerns

## 2013-06-04 ENCOUNTER — Telehealth: Payer: Self-pay | Admitting: *Deleted

## 2013-06-04 ENCOUNTER — Ambulatory Visit
Admission: RE | Admit: 2013-06-04 | Discharge: 2013-06-04 | Disposition: A | Discharge status: Home / Self Care | Payer: 59 | Source: Ambulatory Visit | Attending: Radiation Oncology | Admitting: Radiation Oncology

## 2013-06-04 NOTE — Telephone Encounter (Signed)
Spoke with patient, will get chemo 7/25 rescheduled after RT is complete

## 2013-06-05 ENCOUNTER — Ambulatory Visit
Admission: RE | Admit: 2013-06-05 | Discharge: 2013-06-05 | Disposition: A | Discharge status: Home / Self Care | Payer: 59 | Source: Ambulatory Visit | Attending: Radiation Oncology | Admitting: Radiation Oncology

## 2013-06-06 ENCOUNTER — Other Ambulatory Visit: Payer: Self-pay | Admitting: Oncology

## 2013-06-06 ENCOUNTER — Ambulatory Visit
Admission: RE | Admit: 2013-06-06 | Discharge: 2013-06-06 | Disposition: A | Discharge status: Home / Self Care | Payer: 59 | Source: Ambulatory Visit | Attending: Radiation Oncology | Admitting: Radiation Oncology

## 2013-06-06 DIAGNOSIS — C541 Malignant neoplasm of endometrium: Secondary | ICD-10-CM

## 2013-06-10 ENCOUNTER — Telehealth: Payer: Self-pay | Admitting: Oncology

## 2013-06-10 ENCOUNTER — Telehealth: Payer: Self-pay | Admitting: *Deleted

## 2013-06-10 ENCOUNTER — Ambulatory Visit
Admission: RE | Admit: 2013-06-10 | Discharge: 2013-06-10 | Disposition: A | Discharge status: Home / Self Care | Payer: 59 | Source: Ambulatory Visit | Attending: Radiation Oncology | Admitting: Radiation Oncology

## 2013-06-10 NOTE — Telephone Encounter (Signed)
lvm for opt regarding to appt change to Aug...mailed pt avs and letter

## 2013-06-10 NOTE — Telephone Encounter (Signed)
Per staff message and POF I have scheduled appts.  JMW  

## 2013-06-11 ENCOUNTER — Ambulatory Visit
Admission: RE | Admit: 2013-06-11 | Discharge: 2013-06-11 | Disposition: A | Discharge status: Home / Self Care | Payer: 59 | Source: Ambulatory Visit | Attending: Radiation Oncology | Admitting: Radiation Oncology

## 2013-06-11 ENCOUNTER — Encounter: Payer: Self-pay | Admitting: Radiation Oncology

## 2013-06-11 VITALS — BP 117/79 | HR 97 | Temp 98.0°F | Resp 20 | Wt 190.4 lb

## 2013-06-11 DIAGNOSIS — C541 Malignant neoplasm of endometrium: Secondary | ICD-10-CM

## 2013-06-11 NOTE — Progress Notes (Signed)
[facility]    Radiation Oncology [address] [place]     Maryln Gottron, M.D. [place], [place] [address]               Billie Lade, M.D., Ph.D. Phone: [phone]      Molli Hazard A. Kathrynn Running, M.D. Fax: [phone]      Radene Gunning, M.D., Ph.D.         Lurline Hare, M.D.         Grayland Jack, M.D Weekly Treatment Management Note  Name: Jane Ruiz     MRN: [id number]        CSN: [id number] Date: 06/11/2013      DOB: September 27, 1961  CC: Jane Manns, MD         [facility]    Status: Outpatient  Diagnosis: The encounter diagnosis was Endometrial cancer.  Current Dose: 19.8 Gy  Current Fraction: 11  Planned Dose: 45 Gy  Narrative: Jane Ruiz was seen today for weekly treatment management. The chart was checked and MVCT  were reviewed. She has had some occasional diarrhea controlled well with Imodium. She has noticed some fatigue but denies any nausea  Review of patient's allergies indicates no known allergies. Current Outpatient Prescriptions  Medication Sig Dispense Refill  . atorvastatin (LIPITOR) 10 MG tablet Take 1 tablet (10 mg total) by mouth every evening.  90 tablet  1  . calcium carbonate (ANTACID EXTRA STRENGTH) 750 MG chewable tablet Chew 2 tablets by mouth 2 (two) times daily.       Marland Kitchen dexamethasone (DECADRON) 4 MG tablet Take 5 tabs = 20 mg 12 hrs. And 6 hrs. with food  prior to taxol chemotherapy.  20 tablet  0  . LORazepam (ATIVAN) 1 MG tablet Take 1/2 to 1 tablet under the tongue or swallow every 4-6 hours as needed for nausea.  Will relax you and make you drowsy.  20 tablet  0  . Multiple Vitamin (MULTIVITAMIN) tablet Take 1 tablet by mouth daily.       . Omega-3 Fatty Acids (FISH OIL) 1200 MG CAPS Take 1 capsule by mouth daily.       . ondansetron (ZOFRAN) 8 MG tablet Take 1-2 tablets (8-16 mg total) by mouth every 8 (eight) hours as needed for nausea.  30 tablet  2  . polyethylene glycol powder (GLYCOLAX/MIRALAX) powder Take 17 g by mouth 2  (two) times daily as needed.  255 g  1  . sennosides-docusate sodium (SENOKOT-S) 8.6-50 MG tablet Take 1-2 tablets by mouth 2 (two) times daily as needed for constipation.       No current facility-administered medications for this encounter.   Labs:     Physical Examination:  weight is 190 lb 6.4 oz (86.365 kg). Her oral temperature is 98 F (36.7 C). Her blood pressure is 117/79 and her pulse is 97. Her respiration is 20.    Wt Readings from Last 3 Encounters:  06/11/13 190 lb 6.4 oz (86.365 kg)  06/03/13 189 lb 3.2 oz (85.821 kg)  05/28/13 184 lb 14.4 oz (83.87 kg)     Lungs - Normal respiratory effort, chest expands symmetrically. Lungs are clear to auscultation, no crackles or wheezes.  Heart has regular rhythm and rate  Abdomen is soft and non tender with normal bowel sounds  Assessment:  Patient tolerating treatments well except for above issues.  Plan: Continue treatment per original radiation prescription

## 2013-06-11 NOTE — Progress Notes (Signed)
Pt denies pain, urinary issues, loss of appetite. She reports fatigue, occasional diarrhea relieved w/Imodium x 2 tabs.

## 2013-06-12 ENCOUNTER — Ambulatory Visit
Admission: RE | Admit: 2013-06-12 | Discharge: 2013-06-12 | Disposition: A | Discharge status: Home / Self Care | Payer: 59 | Source: Ambulatory Visit | Attending: Radiation Oncology | Admitting: Radiation Oncology

## 2013-06-13 ENCOUNTER — Ambulatory Visit
Admission: RE | Admit: 2013-06-13 | Discharge: 2013-06-13 | Disposition: A | Discharge status: Home / Self Care | Payer: 59 | Source: Ambulatory Visit | Attending: Radiation Oncology | Admitting: Radiation Oncology

## 2013-06-14 ENCOUNTER — Ambulatory Visit
Admission: RE | Admit: 2013-06-14 | Discharge: 2013-06-14 | Disposition: A | Discharge status: Home / Self Care | Payer: 59 | Source: Ambulatory Visit | Attending: Radiation Oncology | Admitting: Radiation Oncology

## 2013-06-17 ENCOUNTER — Ambulatory Visit
Admission: RE | Admit: 2013-06-17 | Discharge: 2013-06-17 | Disposition: A | Discharge status: Home / Self Care | Payer: 59 | Source: Ambulatory Visit | Attending: Radiation Oncology | Admitting: Radiation Oncology

## 2013-06-17 ENCOUNTER — Ambulatory Visit: Payer: 59

## 2013-06-18 ENCOUNTER — Telehealth: Payer: Self-pay | Admitting: Oncology

## 2013-06-18 ENCOUNTER — Ambulatory Visit (HOSPITAL_BASED_OUTPATIENT_CLINIC_OR_DEPARTMENT_OTHER): Payer: 59 | Admitting: Hematology and Oncology

## 2013-06-18 ENCOUNTER — Encounter: Payer: Self-pay | Admitting: Radiation Oncology

## 2013-06-18 ENCOUNTER — Ambulatory Visit
Admission: RE | Admit: 2013-06-18 | Discharge: 2013-06-18 | Disposition: A | Discharge status: Home / Self Care | Payer: 59 | Source: Ambulatory Visit | Attending: Radiation Oncology | Admitting: Radiation Oncology

## 2013-06-18 VITALS — Temp 98.1°F | Resp 17 | Ht 64.0 in | Wt 189.1 lb

## 2013-06-18 VITALS — BP 126/78 | HR 82 | Temp 98.0°F | Resp 20 | Wt 188.1 lb

## 2013-06-18 DIAGNOSIS — C541 Malignant neoplasm of endometrium: Secondary | ICD-10-CM

## 2013-06-18 DIAGNOSIS — K5900 Constipation, unspecified: Secondary | ICD-10-CM

## 2013-06-18 DIAGNOSIS — C549 Malignant neoplasm of corpus uteri, unspecified: Secondary | ICD-10-CM

## 2013-06-18 NOTE — Progress Notes (Signed)
Pt denies pain, bladder issues, vaginal discharge, loss of appetite. She is fatigued and has intermittent diarrhea. She takes Imodium w/good relief, does not have diarrhea daily.

## 2013-06-18 NOTE — Telephone Encounter (Signed)
Gave pt appt for lab, MD, injection and chemo for August 2014

## 2013-06-18 NOTE — Progress Notes (Signed)
[facility]    Radiation Oncology [address] [place]     Maryln Gottron, M.D. [place], [place] [address]               Billie Lade, M.D., Ph.D. Phone: [phone]      Molli Hazard A. Kathrynn Running, M.D. Fax: [phone]      Radene Gunning, M.D., Ph.D.         Lurline Hare, M.D.         Grayland Jack, M.D Weekly Treatment Management Note  Name: Jane Ruiz     MRN: [id number]        CSN: [id number] Date: 06/18/2013      DOB: [birth year]  CC: [name], MD         [facility]    Status: Outpatient  Diagnosis: The encounter diagnosis was Endometrial cancer.  Current Dose: 27 Gy  Current Fraction: 15  Planned Dose: 45 + Gy  Narrative: [name] was seen today for weekly treatment management. The chart was checked and MVCT  were reviewed. She is having some fatigue and occasional diarrhea but otherwise is tolerating her treatments well  Review of patient's allergies indicates no known allergies.  Current Outpatient Prescriptions  Medication Sig Dispense Refill  . atorvastatin (LIPITOR) 10 MG tablet Take 1 tablet (10 mg total) by mouth every evening.  90 tablet  1  . calcium carbonate (ANTACID EXTRA STRENGTH) 750 MG chewable tablet Chew 2 tablets by mouth 2 (two) times daily.       Marland Kitchen dexamethasone (DECADRON) 4 MG tablet Take 5 tabs = 20 mg 12 hrs. And 6 hrs. with food  prior to taxol chemotherapy.  20 tablet  0  . loperamide (IMODIUM) 2 MG capsule Take 2 mg by mouth 4 (four) times daily as needed for diarrhea or loose stools.      Marland Kitchen LORazepam (ATIVAN) 1 MG tablet Take 1/2 to 1 tablet under the tongue or swallow every 4-6 hours as needed for nausea.  Will relax you and make you drowsy.  20 tablet  0  . Multiple Vitamin (MULTIVITAMIN) tablet Take 1 tablet by mouth daily.       . Omega-3 Fatty Acids (FISH OIL) 1200 MG CAPS Take 1 capsule by mouth daily.       . ondansetron (ZOFRAN) 8 MG tablet Take 1-2 tablets (8-16 mg total) by mouth every 8 (eight) hours as needed  for nausea.  30 tablet  2  . polyethylene glycol powder (GLYCOLAX/MIRALAX) powder Take 17 g by mouth 2 (two) times daily as needed.  255 g  1  . sennosides-docusate sodium (SENOKOT-S) 8.6-50 MG tablet Take 1-2 tablets by mouth 2 (two) times daily as needed for constipation.       No current facility-administered medications for this encounter.      Physical Examination:  weight is 188 lb 1.6 oz (85.322 kg). Her oral temperature is 98 F (36.7 C). Her blood pressure is 126/78 and her pulse is 82. Her respiration is 20.    Wt Readings from Last 3 Encounters:  06/18/13 188 lb 1.6 oz (85.322 kg)  06/11/13 190 lb 6.4 oz (86.365 kg)  06/03/13 189 lb 3.2 oz (85.821 kg)     Lungs - Normal respiratory effort, chest expands symmetrically. Lungs are clear to auscultation, no crackles or wheezes.  Heart has regular rhythm and rate  Abdomen is soft and non tender with normal bowel sounds  Assessment:  Patient tolerating treatments well  Plan: Continue treatment per original radiation prescription

## 2013-06-18 NOTE — Progress Notes (Signed)
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 Patient had 3 cycles of q 3 week taxol carbo from 03-15-13 thru 04-26-13, with neulasta day 2. She is to receive IMRT by Dr Roselind Messier from 05-16-13 thru 06-20-13, then final 3 cycles of chemotherapy. Cycle 3 chemotherapy was complicated by increased nausea all of first week, with difficulty taking adequate po fluids. She was very fatigued and again had significant aching especially in legs; the fatigue is improving and the leg aches have not completely resolved yet. We have discussed adjusting antiemetics with chemotherapy administration and adding IVF when she comes for neulasta after treatment. She does not have PAC  Oncologic History  Patient had been in usual very good health until vaginal bleeding beginning 11-28-12. She was seen in January by PCP Dr Milinda Antis, who had done her gyn exams previously, and sent to Dr Kirtland Bouchard.Fogleman. 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 She saw Dr Nelly Rout on 02-21-13 and was taken to total robotic hysterectomy/BSO/bilateral pelvic nodes, with frozen section showing grade 1 tumor with minimal myometrial involvement, such that paraaortic nodes were not evaluated. She was hospitalized just 1.5 days and required no pain medication after DC home. Final pathology ([id number]), however, showed invasive with a well differentiated component and a high grade serous component. total size was 4.2 cm and invasion 0.3 cm where myometrium 1.8 cm thick; the serous component had LVSI and 3 of 7 pelvic nodes were involved with the  serous component. She saw Dr Duard Brady postoperatively on 02-21-13, recovering well from the surgery. She had CT CAP on 02-25-13 with no findings of concern in the chest and no retroperitoneal adenopathy or other findings of concern for metastatic disease. She has seen Dr Roselind Messier in consultation, with recommendation for IMRT after 3 cycles of chemotherapy and vaginal vault brachytherapy during the final 3 cycles of chemotherapy.    She is undergoing IMRT, will complete treatment on 07/01/13. Tired, and want a break after her RTX treatment is done. Bowels moving adequately now. No fever or symptoms of infection. No SOB or respiratory symptoms. No abdominal or pelvic pain. She has been able to work since last week. No peripheral neuropathy. Remainder of 10 point Review of Systems negative.  Objective:  Vital signs in last 24 hours:  Temp(Src) 98.1 F (36.7 C) (Oral)  Resp 17  Ht 5\' 4"  (1.626 m)  Wt 189 lb 1.6 oz (85.775 kg)  BMI 32.44 kg/m2  Easily ambulatory, NAD. Alopecia.   HEENT:PERRLA, sclera clear, anicteric and oropharynx clear, no lesions LymphaticsCervical, supraclavicular, and axillary nodes normal. No inguinal adenopathy Resp: clear to auscultation bilaterally and normal percussion bilaterally Cardio: regular rate and rhythm GI: soft, non-tender; bowel sounds normal; no masses,  no organomegaly. Robotic surgical incisions entirely healed. Extremities: extremities normal, atraumatic, no cyanosis or edema Neuro:no sensory deficits noted Breast:normal without suspicious masses, skin or nipple changes or axillary nodes Skin without rash or ecchymosis Lab Results:  Results for orders placed in visit on 05/15/13  CBC WITH DIFFERENTIAL      Result Value Range   WBC 10.6 (*) 3.9 - 10.3 10e3/uL   NEUT# 7.3 (*) 1.5 - 6.5 10e3/uL  HGB 10.8 (*) 11.6 - 15.9 g/dL   HCT 16.1 (*) 09.6 - 04.5 %   Platelets 104 (*) 145 - 400 10e3/uL   MCV 88.6  79.5 - 101.0 fL   MCH 29.4  25.1 - 34.0 pg    MCHC 33.2  31.5 - 36.0 g/dL   RBC 4.09 (*) 8.11 - 9.14 10e6/uL   RDW 17.2 (*) 11.2 - 14.5 %   lymph# 2.0  0.9 - 3.3 10e3/uL   MONO# 1.2 (*) 0.1 - 0.9 10e3/uL   Eosinophils Absolute 0.0  0.0 - 0.5 10e3/uL   Basophils Absolute 0.0  0.0 - 0.1 10e3/uL   NEUT% 69.0  38.4 - 76.8 %   LYMPH% 18.9  14.0 - 49.7 %   MONO% 11.6  0.0 - 14.0 %   EOS% 0.3  0.0 - 7.0 %   BASO% 0.2  0.0 - 2.0 %  BASIC METABOLIC PANEL (CC13)      Result Value Range   Sodium 139  136 - 145 mEq/L   Potassium 3.9  3.5 - 5.1 mEq/L   Chloride 105  98 - 107 mEq/L   CO2 27  22 - 29 mEq/L   Glucose 119 (*) 70 - 99 mg/dl   BUN 78.2  7.0 - 95.6 mg/dL   Creatinine 0.8  0.6 - 1.1 mg/dL   Calcium 9.8  8.4 - 21.3 mg/dL     Studies/Results:  No results found. Patient understands that scans will be repeated after chemo/RT completed, shortly prior to return visit to Dr Duard Brady.  Medications: I have reviewed the patient's current medications. Will add EMEND to further chemotherapy cycles : premedications changed to include this for cycle 4, orders not yet signed Will try IVF day of neulasta after next chemo : those orders placed now for 7-26 as sign and hold, will need to move if date changes Neulasta order placed for 7-26 but not signed now.   Assessment/Plan:  1.IIIC high grade serous endometrial carcinoma: history as above with 3 cycles of q 3 week taxol carboplatin given from 03-15-13 thru 04-26-13. She will begin IMRT tomorrow, that planned thru ~ 07-01-13. She will be seen back at this office after her  RT (or sooner if needed), and is tentatively scheduled for cycle 4 chemo on 07-08-13, with neulasta + IV NS on day 2. She will have total of 6 cycles of chemo, with vaginal vault brachytherapy during the last 3 cycles. She will need repeat CT AP and return visit to Dr Duard Brady after treatment is completed. 2.Constipation: improved. She understands that she may have to increase senokot S after each chemotherapy treatment.  3. Increased  nausea with most recent chemo: add EMEND to premeds here, and IVF in addition to neulasta day 2. See comments above for status of orders. 4.history of migraines, but tolerating zofran without difficulty.  Patient understands and is in agreement with plan, and expresses appreciation for care.  Zachery Dakins, MD   06/18/2013, 1:09 PM

## 2013-06-19 ENCOUNTER — Ambulatory Visit
Admission: RE | Admit: 2013-06-19 | Discharge: 2013-06-19 | Disposition: A | Discharge status: Home / Self Care | Payer: 59 | Source: Ambulatory Visit | Attending: Radiation Oncology | Admitting: Radiation Oncology

## 2013-06-20 ENCOUNTER — Ambulatory Visit: Payer: 59

## 2013-06-20 ENCOUNTER — Ambulatory Visit
Admission: RE | Admit: 2013-06-20 | Discharge: 2013-06-20 | Disposition: A | Discharge status: Home / Self Care | Payer: 59 | Source: Ambulatory Visit | Attending: Radiation Oncology | Admitting: Radiation Oncology

## 2013-06-21 ENCOUNTER — Ambulatory Visit: Payer: 59

## 2013-06-21 ENCOUNTER — Ambulatory Visit
Admission: RE | Admit: 2013-06-21 | Discharge: 2013-06-21 | Disposition: A | Discharge status: Home / Self Care | Payer: 59 | Source: Ambulatory Visit | Attending: Radiation Oncology | Admitting: Radiation Oncology

## 2013-06-24 ENCOUNTER — Ambulatory Visit: Payer: 59

## 2013-06-24 ENCOUNTER — Ambulatory Visit
Admission: RE | Admit: 2013-06-24 | Discharge: 2013-06-24 | Disposition: A | Discharge status: Home / Self Care | Payer: 59 | Source: Ambulatory Visit | Attending: Radiation Oncology | Admitting: Radiation Oncology

## 2013-06-25 ENCOUNTER — Ambulatory Visit
Admission: RE | Admit: 2013-06-25 | Discharge: 2013-06-25 | Disposition: A | Discharge status: Home / Self Care | Payer: 59 | Source: Ambulatory Visit | Attending: Radiation Oncology | Admitting: Radiation Oncology

## 2013-06-25 ENCOUNTER — Telehealth: Payer: Self-pay | Admitting: *Deleted

## 2013-06-25 ENCOUNTER — Encounter: Payer: Self-pay | Admitting: Radiation Oncology

## 2013-06-25 VITALS — BP 130/84 | HR 80 | Temp 98.0°F | Resp 20 | Wt 189.0 lb

## 2013-06-25 DIAGNOSIS — C541 Malignant neoplasm of endometrium: Secondary | ICD-10-CM

## 2013-06-25 NOTE — Progress Notes (Signed)
Pt denies pain, fatigue, loss of appetite, skin irritation, dysuria, vaginal discharge. She reports urinary frequency, diarrhea this morning. She took Imodium 2 tabs. She states Imodium controls her occasional diarrhea.

## 2013-06-25 NOTE — Telephone Encounter (Signed)
Called patient to inform of HDR Txs., lvm for a return call

## 2013-06-25 NOTE — Progress Notes (Signed)
[facility]    Radiation Oncology [address] [place]     Maryln Gottron, M.D. [place], [place] [address]               Billie Lade, M.D., Ph.D. Phone: [phone]      Molli Hazard A. Kathrynn Running, M.D. Fax: [phone]      Radene Gunning, M.D., Ph.D.         Lurline Hare, M.D.         Grayland Jack, M.D Weekly Treatment Management Note  Name: Jane Ruiz     MRN: [id number]        CSN: [id number] Date: 06/25/2013      DOB: 12/06/1960 43 CC: [name], MD+         [facility]    Status: Outpatient  Diagnosis: The encounter diagnosis was Endometrial cancer.  Current Dose: 37.8 Gy  Current Fraction: 21  Planned Dose: 45 + Gy  Narrative: Jane Ruiz was seen today for weekly treatment management. The chart was checked and MVCT  were reviewed. She is tolerating the treatments well. Her only changes are some mild diarrhea which is controlled with Imodium. She continues to work full-time.  Review of patient's allergies indicates no known allergies.  Current Outpatient Prescriptions  Medication Sig Dispense Refill  . atorvastatin (LIPITOR) 10 MG tablet Take 1 tablet (10 mg total) by mouth every evening.  90 tablet  1  . calcium carbonate (ANTACID EXTRA STRENGTH) 750 MG chewable tablet Chew 2 tablets by mouth 2 (two) times daily.       Marland Kitchen dexamethasone (DECADRON) 4 MG tablet Take 5 tabs = 20 mg 12 hrs. And 6 hrs. with food  prior to taxol chemotherapy.  20 tablet  0  . loperamide (IMODIUM) 2 MG capsule Take 2 mg by mouth 4 (four) times daily as needed for diarrhea or loose stools.      Marland Kitchen LORazepam (ATIVAN) 1 MG tablet Take 1/2 to 1 tablet under the tongue or swallow every 4-6 hours as needed for nausea.  Will relax you and make you drowsy.  20 tablet  0  . Multiple Vitamin (MULTIVITAMIN) tablet Take 1 tablet by mouth daily.       . Omega-3 Fatty Acids (FISH OIL) 1200 MG CAPS Take 1 capsule by mouth daily.       . ondansetron (ZOFRAN) 8 MG tablet Take 1-2 tablets (8-16  mg total) by mouth every 8 (eight) hours as needed for nausea.  30 tablet  2  . polyethylene glycol powder (GLYCOLAX/MIRALAX) powder Take 17 g by mouth 2 (two) times daily as needed.  255 g  1  . sennosides-docusate sodium (SENOKOT-S) 8.6-50 MG tablet Take 1-2 tablets by mouth 2 (two) times daily as needed for constipation.       No current facility-administered medications for this encounter.   Labs:   Physical Examination:  weight is 189 lb (85.73 kg). Her oral temperature is 98 F (36.7 C). Her blood pressure is 130/84 and her pulse is 80. Her respiration is 20.    Wt Readings from Last 3 Encounters:  06/25/13 189 lb (85.73 kg)  06/18/13 189 lb 1.6 oz (85.775 kg)  06/18/13 188 lb 1.6 oz (85.322 kg)     Lungs - Normal respiratory effort, chest expands symmetrically. Lungs are clear to auscultation, no crackles or wheezes.  Heart has regular rhythm and rate  Abdomen is soft and non tender with normal bowel sounds  Assessment:  Patient tolerating treatments well  Plan: Continue treatment per original radiation prescription. Her intracavitary brachytherapy treatments will be scheduled beginning the second week in August.

## 2013-06-26 ENCOUNTER — Telehealth: Payer: Self-pay | Admitting: Radiation Oncology

## 2013-06-26 ENCOUNTER — Ambulatory Visit: Payer: 59

## 2013-06-26 ENCOUNTER — Ambulatory Visit
Admission: RE | Admit: 2013-06-26 | Discharge: 2013-06-26 | Disposition: A | Discharge status: Home / Self Care | Payer: 59 | Source: Ambulatory Visit | Attending: Radiation Oncology | Admitting: Radiation Oncology

## 2013-06-26 ENCOUNTER — Telehealth: Payer: Self-pay | Admitting: *Deleted

## 2013-06-26 NOTE — Telephone Encounter (Signed)
CALLED PATIENT TO INFORM THAT HER LAST HDR TX. HAS BEEN MOVED TO 07-30-13 AT 1 PM, BECAUSE OF HER CHEMO, LVM FOR A RETURN CALL

## 2013-06-26 NOTE — Telephone Encounter (Signed)
Returned [employer]. Explained to patient that [facility] called to inform her that her final HDR treatment had been moved. Patient verbalized understanding.

## 2013-06-27 ENCOUNTER — Ambulatory Visit
Admission: RE | Admit: 2013-06-27 | Discharge: 2013-06-27 | Disposition: A | Discharge status: Home / Self Care | Payer: 59 | Source: Ambulatory Visit | Attending: Radiation Oncology | Admitting: Radiation Oncology

## 2013-06-27 ENCOUNTER — Ambulatory Visit: Payer: 59

## 2013-06-28 ENCOUNTER — Ambulatory Visit
Admission: RE | Admit: 2013-06-28 | Discharge: 2013-06-28 | Disposition: A | Discharge status: Home / Self Care | Payer: 59 | Source: Ambulatory Visit | Attending: Radiation Oncology | Admitting: Radiation Oncology

## 2013-06-28 ENCOUNTER — Ambulatory Visit: Payer: 59

## 2013-06-28 ENCOUNTER — Other Ambulatory Visit: Payer: 59 | Admitting: Lab

## 2013-06-29 ENCOUNTER — Ambulatory Visit: Payer: 59

## 2013-07-01 ENCOUNTER — Ambulatory Visit: Payer: 59

## 2013-07-01 ENCOUNTER — Ambulatory Visit
Admission: RE | Admit: 2013-07-01 | Discharge: 2013-07-01 | Disposition: A | Discharge status: Home / Self Care | Payer: 59 | Source: Ambulatory Visit | Attending: Radiation Oncology | Admitting: Radiation Oncology

## 2013-07-01 ENCOUNTER — Encounter: Payer: Self-pay | Admitting: Radiation Oncology

## 2013-07-01 VITALS — BP 123/81 | HR 88 | Temp 97.3°F | Resp 20 | Wt 190.9 lb

## 2013-07-01 DIAGNOSIS — C541 Malignant neoplasm of endometrium: Secondary | ICD-10-CM

## 2013-07-01 NOTE — Progress Notes (Signed)
Pt completed external beam radiation today, for vaginal HDR beginning 07/15/13. She denies pain, dysuria,vaginal discharge, fatigue, loss of appetite. She reports urinary frequency but states "It is not bad." She had diarrhea x 1 last night, took Imodium 2 tabs w/good relief. Pt aware of upcoming rad onc appointments.

## 2013-07-05 ENCOUNTER — Ambulatory Visit: Payer: 59

## 2013-07-05 ENCOUNTER — Other Ambulatory Visit: Payer: 59 | Admitting: Lab

## 2013-07-06 ENCOUNTER — Ambulatory Visit: Payer: 59

## 2013-07-08 ENCOUNTER — Ambulatory Visit (HOSPITAL_BASED_OUTPATIENT_CLINIC_OR_DEPARTMENT_OTHER): Payer: 59 | Admitting: Hematology and Oncology

## 2013-07-08 ENCOUNTER — Telehealth: Payer: Self-pay | Admitting: Hematology and Oncology

## 2013-07-08 ENCOUNTER — Ambulatory Visit (HOSPITAL_BASED_OUTPATIENT_CLINIC_OR_DEPARTMENT_OTHER): Payer: 59

## 2013-07-08 ENCOUNTER — Other Ambulatory Visit (HOSPITAL_BASED_OUTPATIENT_CLINIC_OR_DEPARTMENT_OTHER): Payer: 59 | Admitting: Lab

## 2013-07-08 VITALS — BP 136/89 | HR 111 | Temp 97.8°F | Resp 20 | Ht 64.0 in | Wt 191.3 lb

## 2013-07-08 DIAGNOSIS — C549 Malignant neoplasm of corpus uteri, unspecified: Secondary | ICD-10-CM

## 2013-07-08 DIAGNOSIS — Z5111 Encounter for antineoplastic chemotherapy: Secondary | ICD-10-CM

## 2013-07-08 DIAGNOSIS — C801 Malignant (primary) neoplasm, unspecified: Secondary | ICD-10-CM

## 2013-07-08 DIAGNOSIS — C541 Malignant neoplasm of endometrium: Secondary | ICD-10-CM

## 2013-07-08 DIAGNOSIS — R110 Nausea: Secondary | ICD-10-CM

## 2013-07-08 LAB — COMPREHENSIVE METABOLIC PANEL (CC13)
AST: 24 U/L (ref 5–34)
Albumin: 3.6 g/dL (ref 3.5–5.0)
Alkaline Phosphatase: 83 U/L (ref 40–150)
BUN: 10.5 mg/dL (ref 7.0–26.0)
Glucose: 192 mg/dl — ABNORMAL HIGH (ref 70–140)
Potassium: 4.1 mEq/L (ref 3.5–5.1)
Total Bilirubin: <0.2 mg/dL (ref 0.20–1.20)

## 2013-07-08 LAB — CBC WITH DIFFERENTIAL/PLATELET
Basophils Absolute: 0 10*3/uL (ref 0.0–0.1)
EOS%: 0.1 % (ref 0.0–7.0)
LYMPH%: 3.3 % — ABNORMAL LOW (ref 14.0–49.7)
MCH: 31.1 pg (ref 25.1–34.0)
MCV: 92.3 fL (ref 79.5–101.0)
MONO%: 1 % (ref 0.0–14.0)
Platelets: 267 10*3/uL (ref 145–400)
RBC: 3.87 10*6/uL (ref 3.70–5.45)
RDW: 15.5 % — ABNORMAL HIGH (ref 11.2–14.5)

## 2013-07-08 MED ORDER — ONDANSETRON 16 MG/50ML IVPB (CHCC)
16.0000 mg | Freq: Once | INTRAVENOUS | Status: AC
  Administered 2013-07-08: 16 mg via INTRAVENOUS

## 2013-07-08 MED ORDER — PACLITAXEL CHEMO INJECTION 300 MG/50ML
175.0000 mg/m2 | Freq: Once | INTRAVENOUS | Status: AC
  Administered 2013-07-08: 348 mg via INTRAVENOUS
  Filled 2013-07-08: qty 58

## 2013-07-08 MED ORDER — SODIUM CHLORIDE 0.9 % IV SOLN
Freq: Once | INTRAVENOUS | Status: AC
  Administered 2013-07-08: 11:00:00 via INTRAVENOUS

## 2013-07-08 MED ORDER — SODIUM CHLORIDE 0.9 % IV SOLN
676.0000 mg | Freq: Once | INTRAVENOUS | Status: AC
  Administered 2013-07-08: 680 mg via INTRAVENOUS
  Filled 2013-07-08: qty 68

## 2013-07-08 MED ORDER — DEXAMETHASONE SODIUM PHOSPHATE 20 MG/5ML IJ SOLN
12.0000 mg | Freq: Once | INTRAMUSCULAR | Status: AC
  Administered 2013-07-08: 12 mg via INTRAVENOUS

## 2013-07-08 MED ORDER — SODIUM CHLORIDE 0.9 % IV SOLN
150.0000 mg | Freq: Once | INTRAVENOUS | Status: AC
  Administered 2013-07-08: 150 mg via INTRAVENOUS
  Filled 2013-07-08: qty 5

## 2013-07-08 MED ORDER — DIPHENHYDRAMINE HCL 50 MG/ML IJ SOLN
50.0000 mg | Freq: Once | INTRAMUSCULAR | Status: AC
  Administered 2013-07-08: 50 mg via INTRAVENOUS

## 2013-07-08 MED ORDER — FAMOTIDINE IN NACL 20-0.9 MG/50ML-% IV SOLN
20.0000 mg | Freq: Once | INTRAVENOUS | Status: AC
  Administered 2013-07-08: 20 mg via INTRAVENOUS

## 2013-07-08 NOTE — Telephone Encounter (Signed)
gv and printed appt sched and avs for pt...emailed MB to add IVF for 8.26.14

## 2013-07-08 NOTE — Patient Instructions (Addendum)
[facility] Discharge Instructions for Patients Receiving Chemotherapy  Today you received the following chemotherapy agents Taxol and Carboplatin.  To help prevent nausea and vomiting after your treatment, we encourage you to take your nausea medication.   If you develop nausea and vomiting that is not controlled by your nausea medication, call the clinic.   BELOW ARE SYMPTOMS THAT SHOULD BE REPORTED IMMEDIATELY:  *FEVER GREATER THAN 100.5 F  *CHILLS WITH OR WITHOUT FEVER  NAUSEA AND VOMITING THAT IS NOT CONTROLLED WITH YOUR NAUSEA MEDICATION  *UNUSUAL SHORTNESS OF BREATH  *UNUSUAL BRUISING OR BLEEDING  TENDERNESS IN MOUTH AND THROAT WITH OR WITHOUT PRESENCE OF ULCERS  *URINARY PROBLEMS  *BOWEL PROBLEMS  UNUSUAL RASH Items with * indicate a potential emergency and should be followed up as soon as possible.  Feel free to call the clinic you have any questions or concerns. The clinic phone number is [phone].    

## 2013-07-08 NOTE — Progress Notes (Signed)
OFFICE PROGRESS NOTE   07/08/2013    Physicians:P.Duard Brady;J.Kinard, Tower, Audrie Gallus, MD (PCP), K.Fogleman, A.Jordan, M.Magod, [facility]  INTERVAL HISTORY:   Patient is seen, alone for visit, in continuing attention to adjuvant taxol carboplatin chemotherapy for IIIC serous endometrial cancer, which is being given in sandwich fashion with RT. Patient had 3 cycles of q 3 week taxol carbo from 03-15-13 thru 04-26-13, with neulasta day 2. She is to receive IMRT by Dr Roselind Messier from 05-16-13 thru 06-20-13, then final 3 cycles of chemotherapy. Cycle 3 chemotherapy was complicated by increased nausea all of first week, with difficulty taking adequate po fluids. She was very fatigued and again had significant aching especially in legs; the fatigue is improving and the leg aches have not completely resolved yet. We have discussed adjusting antiemetics with chemotherapy administration and adding IVF when she comes for neulasta after treatment. She does not have PAC  Oncologic History  Patient had been in usual very good health until vaginal bleeding beginning 11-28-12. She was seen in January by PCP Dr Milinda Antis, who had done her gyn exams previously, and sent to Dr Kirtland Bouchard.Fogleman. 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 She saw Dr Nelly Rout on 02-21-13 and was taken to total robotic hysterectomy/BSO/bilateral pelvic nodes, with frozen section showing grade 1 tumor with minimal myometrial involvement, such that paraaortic nodes were not evaluated. She was hospitalized just 1.5 days and required no pain medication after DC home. Final pathology ([id number]), however, showed invasive with a well differentiated component and a high grade serous component. total size was 4.2 cm and invasion 0.3 cm where myometrium 1.8 cm thick; the serous component had LVSI and 3 of 7 pelvic nodes were involved with the  serous component. She saw Dr Duard Brady postoperatively on 02-21-13, recovering well from the surgery. She had CT CAP on 02-25-13 with no findings of concern in the chest and no retroperitoneal adenopathy or other findings of concern for metastatic disease. She has seen Dr Roselind Messier in consultation, with recommendation for IMRT after 3 cycles of chemotherapy and vaginal vault brachytherapy during the final 3 cycles of chemotherapy.    She is just finsihed IMRT treatment on 07/01/13. Recovering now. Bowels moving adequately now. No fever or symptoms of infection. No SOB or respiratory symptoms. No abdominal or pelvic pain. She has been able to work since last week. No peripheral neuropathy. Remainder of 10 point Review of Systems negative.  Objective:  Vital signs in last 24 hours:  BP 136/89  Pulse 111  Temp(Src) 97.8 F (36.6 C) (Oral)  Resp 20  Ht 5\' 4"  (1.626 m)  Wt 191 lb 4.8 oz (86.773 kg)  BMI 32.82 kg/m2  Easily ambulatory, NAD. Alopecia.   HEENT:PERRLA, sclera clear, anicteric and oropharynx clear, no lesions LymphaticsCervical, supraclavicular, and axillary nodes normal. No inguinal adenopathy Resp: clear to auscultation bilaterally and normal percussion bilaterally Cardio: regular rate and rhythm GI: soft, non-tender; bowel sounds normal; no masses,  no organomegaly. Robotic surgical incisions entirely healed. Extremities: extremities normal, atraumatic, no cyanosis or edema Neuro:no sensory deficits noted Breast:normal without suspicious masses, skin or nipple changes or axillary nodes Skin without rash or ecchymosis Lab Results:  Results for orders placed in visit on 07/08/13  CBC WITH DIFFERENTIAL      Result Value Range   WBC 10.3  3.9 - 10.3 10e3/uL   NEUT# 9.8 (*) 1.5 - 6.5 10e3/uL   HGB 12.0  11.6 - 15.9 g/dL   HCT 16.1  09.6 - 04.5 %   Platelets 267  145 - 400 10e3/uL   MCV 92.3  79.5 - 101.0 fL   MCH 31.1  25.1 - 34.0 pg   MCHC 33.7  31.5 - 36.0 g/dL   RBC 4.09   8.11 - 9.14 10e6/uL   RDW 15.5 (*) 11.2 - 14.5 %   lymph# 0.3 (*) 0.9 - 3.3 10e3/uL   MONO# 0.1  0.1 - 0.9 10e3/uL   Eosinophils Absolute 0.0  0.0 - 0.5 10e3/uL   Basophils Absolute 0.0  0.0 - 0.1 10e3/uL   NEUT% 95.4 (*) 38.4 - 76.8 %   LYMPH% 3.3 (*) 14.0 - 49.7 %   MONO% 1.0  0.0 - 14.0 %   EOS% 0.1  0.0 - 7.0 %   BASO% 0.2  0.0 - 2.0 %     Studies/Results:  No results found. Patient understands that scans will be repeated after chemo/RT completed, shortly prior to return visit to Dr Duard Brady.  Medications: I have reviewed the patient's current medications. Will add EMEND to further chemotherapy cycles : premedications changed to include this for cycle 4, orders not yet signed Will try IVF day of neulasta after next chemo : those orders placed now for 7-26 as sign and hold, will need to move if date changes Neulasta order placed for 7-26 but not signed now.   Assessment/Plan:  1.IIIC high grade serous endometrial carcinoma: history as above with 3 cycles of q 3 week taxol carboplatin given from 03-15-13 thru 04-26-13. Now s/p IMRT, that was finished last week. She is here to start cycle 4 of Carb/taxol (plan of total 6 cycles) today 07/08/2013, and is scheduled for cycle 5 chemo on 07-29-13, with neulasta + IV NS on day 2. She will have total of 6 cycles of chemo, with vaginal vault brachytherapy during the last 3 cycles. She will need repeat CT AP and return visit to Dr Duard Brady after treatment is completed. 2.Constipation: improved. She understands that she may have to increase senokot S after each chemotherapy treatment.  3. Increased nausea with most recent chemo: add EMEND to premeds here, and IVF in addition to neulasta day 2. See comments above for status of orders. 4.history of migraines, but tolerating zofran without difficulty.  Patient understands and is in agreement with plan, and expresses appreciation for care.  Zachery Dakins, MD   07/08/2013, 10:01 AM

## 2013-07-09 ENCOUNTER — Ambulatory Visit (HOSPITAL_BASED_OUTPATIENT_CLINIC_OR_DEPARTMENT_OTHER): Payer: 59

## 2013-07-09 ENCOUNTER — Ambulatory Visit: Payer: 59

## 2013-07-09 VITALS — BP 130/80 | HR 69 | Temp 98.5°F | Resp 20

## 2013-07-09 DIAGNOSIS — Z5189 Encounter for other specified aftercare: Secondary | ICD-10-CM

## 2013-07-09 DIAGNOSIS — C541 Malignant neoplasm of endometrium: Secondary | ICD-10-CM

## 2013-07-09 DIAGNOSIS — C549 Malignant neoplasm of corpus uteri, unspecified: Secondary | ICD-10-CM

## 2013-07-09 MED ORDER — SODIUM CHLORIDE 0.9 % IV SOLN
Freq: Once | INTRAVENOUS | Status: AC
  Administered 2013-07-09: 14:00:00 via INTRAVENOUS

## 2013-07-09 MED ORDER — PEGFILGRASTIM INJECTION 6 MG/0.6ML
6.0000 mg | Freq: Once | SUBCUTANEOUS | Status: AC
  Administered 2013-07-09: 6 mg via SUBCUTANEOUS
  Filled 2013-07-09: qty 0.6

## 2013-07-09 NOTE — Patient Instructions (Addendum)
Dehydration, Adult Dehydration is when you lose more fluids from the body than you take in. Vital organs like the kidneys, brain, and heart cannot function without a proper amount of fluids and salt. Any loss of fluids from the body can cause dehydration.  CAUSES   Vomiting.  Diarrhea.  Excessive sweating.  Excessive urine output.  Fever. SYMPTOMS  Mild dehydration  Thirst.  Dry lips.  Slightly dry mouth. Moderate dehydration  Very dry mouth.  Sunken eyes.  Skin does not bounce back quickly when lightly pinched and released.  Dark urine and decreased urine production.  Decreased tear production.  Headache. Severe dehydration  Very dry mouth.  Extreme thirst.  Rapid, weak pulse (more than 100 beats per minute at rest).  Cold hands and feet.  Not able to sweat in spite of heat and temperature.  Rapid breathing.  Blue lips.  Confusion and lethargy.  Difficulty being awakened.  Minimal urine production.  No tears. DIAGNOSIS  Your caregiver will diagnose dehydration based on your symptoms and your exam. Blood and urine tests will help confirm the diagnosis. The diagnostic evaluation should also identify the cause of dehydration. TREATMENT  Treatment of mild or moderate dehydration can often be done at home by increasing the amount of fluids that you drink. It is best to drink small amounts of fluid more often. Drinking too much at one time can make vomiting worse. Refer to the home care instructions below. Severe dehydration needs to be treated at the hospital where you will probably be given intravenous (IV) fluids that contain water and electrolytes. HOME CARE INSTRUCTIONS   Ask your caregiver about specific rehydration instructions.  Drink enough fluids to keep your urine clear or pale yellow.  Drink small amounts frequently if you have nausea and vomiting.  Eat as you normally do.  Avoid:  Foods or drinks high in sugar.  Carbonated  drinks.  Juice.  Extremely hot or cold fluids.  Drinks with caffeine.  Fatty, greasy foods.  Alcohol.  Tobacco.  Overeating.  Gelatin desserts.  Wash your hands well to avoid spreading bacteria and viruses.  Only take over-the-counter or prescription medicines for pain, discomfort, or fever as directed by your caregiver.  Ask your caregiver if you should continue all prescribed and over-the-counter medicines.  Keep all follow-up appointments with your caregiver. SEEK MEDICAL CARE IF:  You have abdominal pain and it increases or stays in one area (localizes).  You have a rash, stiff neck, or severe headache.  You are irritable, sleepy, or difficult to awaken.  You are weak, dizzy, or extremely thirsty. SEEK IMMEDIATE MEDICAL CARE IF:   You are unable to keep fluids down or you get worse despite treatment.  You have frequent episodes of vomiting or diarrhea.  You have blood or green matter (bile) in your vomit.  You have blood in your stool or your stool looks black and tarry.  You have not urinated in 6 to 8 hours, or you have only urinated a small amount of very dark urine.  You have a fever.  You faint. MAKE SURE YOU:   Understand these instructions.  Will watch your condition.  Will get help right away if you are not doing well or get worse. Document Released: 11/21/2005 Document Revised: 02/13/2012 Document Reviewed: 07/11/2011 Exit[employer] Patient Information 2014 [employer].  

## 2013-07-11 ENCOUNTER — Telehealth: Payer: Self-pay | Admitting: *Deleted

## 2013-07-11 NOTE — Telephone Encounter (Signed)
CALLED PATIENT TO REMIND OF HDR CASE FOR 07-15-13 - ARRIVAL TIME - 8 AM, SPOKE WITH PATIENT'S HUSBAND LEE AND HE CONFIRMED THAT THEY ARE AWARE OF THESE APPTS.

## 2013-07-15 ENCOUNTER — Ambulatory Visit
Admission: RE | Admit: 2013-07-15 | Discharge: 2013-07-15 | Disposition: A | Discharge status: Home / Self Care | Payer: 59 | Source: Ambulatory Visit | Attending: Radiation Oncology | Admitting: Radiation Oncology

## 2013-07-15 ENCOUNTER — Encounter: Payer: Self-pay | Admitting: Radiation Oncology

## 2013-07-15 VITALS — BP 119/88 | HR 115 | Temp 98.3°F | Resp 20 | Wt 184.9 lb

## 2013-07-15 DIAGNOSIS — C541 Malignant neoplasm of endometrium: Secondary | ICD-10-CM

## 2013-07-15 DIAGNOSIS — Z510 Encounter for antineoplastic radiation therapy: Secondary | ICD-10-CM | POA: Insufficient documentation

## 2013-07-15 DIAGNOSIS — C549 Malignant neoplasm of corpus uteri, unspecified: Secondary | ICD-10-CM | POA: Insufficient documentation

## 2013-07-15 NOTE — Progress Notes (Signed)
   Department of Radiation Oncology  Phone:  [phone] Fax:        [phone]  Simulation note  After fitting for her vaginal cylinder the patient was transferred to the CT simulation suite. The vaginal cylinder was reinserted into the vaginal vault. The cylinder was affixed to the CT/MR stabilization plate to prevent slippage.  The CT scan through the pelvis area was performed. The vaginal cylinder was in good position. Good images of the bladder and rectum were obtained for planning.  Patient will undergo planning at this time for her first high-dose-rate treatment. She will receive 6 gray to the proximal mucosal surface.  Treatment length will be 3 cm.  -----------------------------------  Billie Lade, PhD, MD

## 2013-07-15 NOTE — Progress Notes (Signed)
Pt in nursing w/sister, for HDR fitting, ct sim this morning. Pt denies pain, but states she had chemo one week ago, and "it has made me weak and tired". Pt states she had diarrhea Sat, took Imodium 2 tabs w/good relief. She states she had small amount diarrhea this morning, did not take Imodium. Pt denies urinary issues, vaginal discharge, loss of appetite. She woke w/nausea today, took Zofran.  Reviewed HDR process, plan w/pt this morning. She verbalized understanding.

## 2013-07-15 NOTE — Progress Notes (Signed)
Please see the Nurse Progress Note in the MD Initial Consult Encounter for this patient. 

## 2013-07-15 NOTE — Progress Notes (Signed)
   Department of Radiation Oncology  Phone:  [phone] Fax:        [phone]  High-dose-rate brachytherapy procedure note  The patient was placed on the high-dose-rate treatment table. The previously constructed vaginal cylinder was placed in the proximal vagina. The vaginal cylinder was affixed to the CT/MR stabilization plate to prevent slippage. A fiducial marker was placed within the vaginal cylinder for imaging purposes  Verification simulation note  An AP and lateral film was obtained in the treatment position. This was compared to planning films earlier today documenting good position of the vaginal cylinder for treatment.  High-dose-rate brachytherapy treatment  The remote afterloading catheter was affixed to the vaginal cylinder. Patient then proceeded to undergo her first high-dose-rate treatment. She was prescribed a dose of 6 gray to the mucosal surface.  This was achieved with 1 channel using 7 dwell positions. Total treatment time was 303 seconds. The patient tolerated the procedure well. After completion of her therapy a radiation survey was performed documenting return of the iridium source into the Nucletron safe.  -----------------------------------  Billie Lade, PhD, MD

## 2013-07-15 NOTE — Progress Notes (Signed)
   Department of Radiation Oncology  Phone:  [phone] Fax:        [phone]  Vaginal brachytherapy procedure note  The patient was taken to the nurse's station and placed in the dorsolithotomy position. A  pelvic examination was performed showing no evidence recurrence along the vaginal vault or vaginal cuff region. The cuff is intact. There are no pelvic masses appreciated on exam. Patient proceeded to undergo fitting for her vaginal cylinder. The optimal diameter was a 3 CM diameter cylinder placed proximally within the vaginal vault, followed by 2.5 CM diameter cylinder distally within the vaginal vault. The patient tolerated the procedure well. ( 9-9-9-8-8).   Simple treatment device note  Patient had construction of her custom vaginal cylinder for treatment. She will be treated with 5 vaginal rings,  with a 3 cm cylinder placement proximally within the vaginal vault. This arrangement distended the vaginal vault without undue discomfort.  -----------------------------------  Billie Lade, PhD, MD

## 2013-07-17 ENCOUNTER — Telehealth: Payer: Self-pay | Admitting: Hematology and Oncology

## 2013-07-17 NOTE — Telephone Encounter (Signed)
, °

## 2013-07-18 ENCOUNTER — Telehealth: Payer: Self-pay | Admitting: *Deleted

## 2013-07-18 NOTE — Telephone Encounter (Signed)
Called patient to remind of HDR Tx. For 07-22-13, lvm for a return call

## 2013-07-22 ENCOUNTER — Encounter: Payer: Self-pay | Admitting: Radiation Oncology

## 2013-07-22 ENCOUNTER — Ambulatory Visit
Admission: RE | Admit: 2013-07-22 | Discharge: 2013-07-22 | Disposition: A | Discharge status: Home / Self Care | Payer: 59 | Source: Ambulatory Visit | Attending: Radiation Oncology | Admitting: Radiation Oncology

## 2013-07-22 NOTE — Progress Notes (Signed)
    Department of Radiation Oncology  Phone:  [phone] Fax:        [phone]     High-dose-rate brachytherapy procedure note    Simple treatment device note   Patient had construction of her custom vaginal cylinder for treatment. She will be treated with 5 vaginal rings, with a 3 cm cylinder placement proximally within the vaginal vault. This arrangement distended the vaginal vault without undue discomfort.     Vaginal brachytherapy procedure   The patient was placed on the high-dose-rate treatment table. A pelvic exam was performed showing the vaginal cuff to be intact without any palpable pelvic masses. The previously constructed vaginal cylinder was placed in the proximal vagina. The vaginal cylinder was affixed to the CT/MR stabilization plate to prevent slippage. A fiducial marker was placed within the vaginal cylinder for imaging purposes.   Verification simulation note   An AP and lateral film was obtained in the treatment position. This was compared to planning films earlier today documenting good position of the vaginal cylinder for treatment.   High-dose-rate brachytherapy treatment   The remote afterloading catheter was affixed to the vaginal cylinder. Patient then proceeded to undergo her second high-dose-rate treatment. She was prescribed a dose of 6 gray to the mucosal surface. This was achieved with 1 channel using 7 dwell positions. Total treatment time was 323.5 seconds. The patient tolerated the procedure well. After completion of her therapy a radiation survey was performed documenting return of the iridium source into the Nucletron safe.   -----------------------------------  Billie Lade, PhD, MD

## 2013-07-25 ENCOUNTER — Telehealth: Payer: Self-pay | Admitting: *Deleted

## 2013-07-25 ENCOUNTER — Ambulatory Visit: Payer: 59 | Attending: Oncology | Admitting: Oncology

## 2013-07-25 ENCOUNTER — Ambulatory Visit: Payer: 59

## 2013-07-25 ENCOUNTER — Other Ambulatory Visit: Payer: Self-pay | Admitting: Oncology

## 2013-07-25 ENCOUNTER — Encounter: Payer: Self-pay | Admitting: Oncology

## 2013-07-25 ENCOUNTER — Telehealth: Payer: Self-pay | Admitting: Oncology

## 2013-07-25 ENCOUNTER — Other Ambulatory Visit (HOSPITAL_BASED_OUTPATIENT_CLINIC_OR_DEPARTMENT_OTHER): Payer: 59 | Admitting: Lab

## 2013-07-25 VITALS — BP 119/71 | HR 89 | Temp 98.8°F | Resp 20 | Ht 64.0 in | Wt 189.7 lb

## 2013-07-25 DIAGNOSIS — C801 Malignant (primary) neoplasm, unspecified: Secondary | ICD-10-CM

## 2013-07-25 DIAGNOSIS — C541 Malignant neoplasm of endometrium: Secondary | ICD-10-CM

## 2013-07-25 DIAGNOSIS — C549 Malignant neoplasm of corpus uteri, unspecified: Secondary | ICD-10-CM

## 2013-07-25 DIAGNOSIS — D649 Anemia, unspecified: Secondary | ICD-10-CM

## 2013-07-25 LAB — CBC WITH DIFFERENTIAL/PLATELET
BASO%: 0.4 % (ref 0.0–2.0)
Basophils Absolute: 0 10*3/uL (ref 0.0–0.1)
HCT: 30 % — ABNORMAL LOW (ref 34.8–46.6)
HGB: 10.4 g/dL — ABNORMAL LOW (ref 11.6–15.9)
LYMPH%: 7.1 % — ABNORMAL LOW (ref 14.0–49.7)
MCH: 31.8 pg (ref 25.1–34.0)
MCHC: 34.8 g/dL (ref 31.5–36.0)
MONO#: 0.9 10*3/uL (ref 0.1–0.9)
NEUT%: 77.2 % — ABNORMAL HIGH (ref 38.4–76.8)
Platelets: 140 10*3/uL — ABNORMAL LOW (ref 145–400)
WBC: 6.2 10*3/uL (ref 3.9–10.3)

## 2013-07-25 LAB — COMPREHENSIVE METABOLIC PANEL (CC13)
ALT: 34 U/L (ref 0–55)
BUN: 9.5 mg/dL (ref 7.0–26.0)
CO2: 23 mEq/L (ref 22–29)
Calcium: 9.3 mg/dL (ref 8.4–10.4)
Chloride: 107 mEq/L (ref 98–109)
Creatinine: 0.7 mg/dL (ref 0.6–1.1)
Total Bilirubin: 0.28 mg/dL (ref 0.20–1.20)

## 2013-07-25 NOTE — Telephone Encounter (Signed)
Per staff message and POF I have scheduled appts.  JMW  

## 2013-07-25 NOTE — Progress Notes (Signed)
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 Patient had 3 cycles of q 3 week taxol carbo from 03-15-13 thru 04-26-13, with neulasta day 2, then external beam RT completed 07-01-13; she has had HDR concomitant with chemotherapy since chemo resumed with cycle 4 taxol carboplatin on 07-08-13. She is due cycle 5 chemo on 07-29-13 and will have last HDR on 8-26 (with neulasta and additional IVF on 8-26 also). She tolerated cycle 5 some better with IVF on day 2, including less severe constipation and aches tolerable with alternating ibuprofen and tylenol + claritin. She is unable to work for ~ 10 days after each treatment, mostly due to fatigue and aches. Other than some fatigue, she is feeling fairly well today.  She does not have PAC  Oncologic History  Patient had been in usual very good health until vaginal bleeding beginning 11-28-12. She was seen in January by PCP Dr Milinda Antis, who had done her gyn exams previously, and sent to Dr Kirtland Bouchard.Fogleman. 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 She saw Dr Nelly Rout on 02-21-13 and was taken to total robotic hysterectomy/BSO/bilateral pelvic nodes, with frozen section showing grade 1 tumor with minimal myometrial involvement, such that paraaortic nodes were not evaluated. She was hospitalized just 1.5 days and required no pain medication after DC home. Final pathology ([id number]), however, showed invasive with a well differentiated component and a high grade serous component. total size was 4.2 cm and invasion 0.3 cm where myometrium 1.8 cm thick; the serous  component had LVSI and 3 of 7 pelvic nodes were involved with the serous component. She saw Dr Duard Brady postoperatively on 02-21-13, recovering well from the surgery. She had CT CAP on 02-25-13 with no findings of concern in the chest and no retroperitoneal adenopathy or other findings of concern for metastatic disease.  Dr Roselind Messier recommended  IMRT after 3 cycles of chemotherapy and vaginal vault brachytherapy during the final 3 cycles of chemotherapy.    Review of systems as above, also: Bowels moving ~ 3x daily, loose since RT. Pushing oral fluids, voiding frequently thru day and up at least x3 to void good amounts thru night, without dysuria; we discussed stopping oral fluids ~ 2 hrs before bedtime. No SOB at rest. Zofran helpful for some nausea week after chemo, does not need beyond that. No significant peripheral neuropathy. No bleeding. No symptoms of infection.  Remainder of 10 point Review of Systems negative.  Objective:  Vital signs in last 24 hours:  BP 119/71  Pulse 89  Temp(Src) 98.8 F (37.1 C) (Oral)  Resp 20  Ht 5\' 4"  (1.626 m)  Wt 189 lb 11.2 oz (86.047 kg)  BMI 32.55 kg/m2 Easily ambulatory, respirations not labored RA, looks comfortable.   HEENT:PERRL, sclera clear, anicteric and oropharynx clear, no lesions. Alopecia Lymphatics cervical,suraclavicular, axillary or inguinal adenopathy Resp: clear to auscultation bilaterally and normal percussion bilaterally Cardio: regular rate and rhythm GI: soft, nontender, not distended, no mass or organomegaly, normally active BS, laparoscopic surgical incisions healed and not remarkable. Extremities: without pitting edema, cords, tenderness Neuro: nonfocal Breasts bilaterally without dominant mass, skin or nipple findings Skin without rash, petechiae or ecchymoses  Lab Results:  Results for orders placed in visit on 07/25/13  CBC WITH DIFFERENTIAL      Result Value Range   WBC 6.2  3.9 - 10.3 10e3/uL   NEUT# 4.7  1.5 - 6.5  10e3/uL   HGB 10.4 (*) 11.6 - 15.9 g/dL   HCT 78.2 (*) 95.6 - 21.3 %   Platelets 140 (*) 145 - 400 10e3/uL   MCV 91.4  79.5 - 101.0 fL   MCH 31.8  25.1 - 34.0 pg   MCHC 34.8  31.5 - 36.0 g/dL   RBC 0.86 (*) 5.78 - 4.69 10e6/uL   RDW 14.6 (*) 11.2 - 14.5 %   lymph# 0.4 (*) 0.9 - 3.3 10e3/uL   MONO# 0.9  0.1 - 0.9 10e3/uL   Eosinophils Absolute 0.0  0.0 - 0.5 10e3/uL   Basophils Absolute 0.0  0.0 - 0.1 10e3/uL   NEUT% 77.2 (*) 38.4 - 76.8 %   LYMPH% 7.1 (*) 14.0 - 49.7 %   MONO% 14.7 (*) 0.0 - 14.0 %   EOS% 0.6  0.0 - 7.0 %   BASO% 0.4  0.0 - 2.0 %  COMPREHENSIVE METABOLIC PANEL (CC13)      Result Value Range   Sodium 142  136 - 145 mEq/L   Potassium 3.8  3.5 - 5.1 mEq/L   Chloride 107  98 - 109 mEq/L   CO2 23  22 - 29 mEq/L   Glucose 92  70 - 140 mg/dl   BUN 9.5  7.0 - 62.9 mg/dL   Creatinine 0.7  0.6 - 1.1 mg/dL   Total Bilirubin 5.28  0.20 - 1.20 mg/dL   Alkaline Phosphatase 88  40 - 150 U/L   AST 24  5 - 34 U/L   ALT 34  0 - 55 U/L   Total Protein 7.1  6.4 - 8.3 g/dL   Albumin 3.4 (*) 3.5 - 5.0 g/dL   Calcium 9.3  8.4 - 41.3 mg/dL     Studies/Results:  No results found.  Medications: I have reviewed the patient's current medications. She understands that she should eat with each ibuprofen dose.   Assessment/Plan: 1.IIIC high grade serous endometrial carcinoma: history as above, continuing chemo and HDR. She will have total of 6 cycles of chemo, with last vaginal vault brachytherapy 07-30-13. She will need repeat CT AP and return visit to Dr Duard Brady after treatment is completed. I will see her back ~ 08-14-13 with lab, prior to sixth and last planned chemotherapy on 08-19-13. We will use neulasta and IVF on day 2 each cycle. 2.Constipation: improved. She understands that she may have to increase senokot S after each chemotherapy treatment.  3. Nausea better controlled with addition of EMEND to premeds and IVF  day 2.  4.history of migraines, but tolerating zofran without  difficulty. 5.mild anemia from chemo and RT: follow   Patient understood plan and is in agreement. All questions answered to her satisfaction    Reece Packer, MD   07/25/2013, 10:59 AM

## 2013-07-26 ENCOUNTER — Other Ambulatory Visit: Payer: Self-pay | Admitting: Oncology

## 2013-07-26 ENCOUNTER — Other Ambulatory Visit: Payer: 59 | Admitting: Lab

## 2013-07-26 ENCOUNTER — Ambulatory Visit: Payer: 59

## 2013-07-26 DIAGNOSIS — C541 Malignant neoplasm of endometrium: Secondary | ICD-10-CM

## 2013-07-26 LAB — CA 125: CA 125: 4.9 U/mL (ref 0.0–30.2)

## 2013-07-29 ENCOUNTER — Telehealth: Payer: Self-pay | Admitting: *Deleted

## 2013-07-29 ENCOUNTER — Ambulatory Visit (HOSPITAL_BASED_OUTPATIENT_CLINIC_OR_DEPARTMENT_OTHER): Payer: 59

## 2013-07-29 VITALS — BP 119/77 | HR 103 | Temp 98.2°F | Resp 20

## 2013-07-29 DIAGNOSIS — C541 Malignant neoplasm of endometrium: Secondary | ICD-10-CM

## 2013-07-29 DIAGNOSIS — Z5111 Encounter for antineoplastic chemotherapy: Secondary | ICD-10-CM

## 2013-07-29 DIAGNOSIS — C549 Malignant neoplasm of corpus uteri, unspecified: Secondary | ICD-10-CM

## 2013-07-29 MED ORDER — SODIUM CHLORIDE 0.9 % IV SOLN
Freq: Once | INTRAVENOUS | Status: AC
  Administered 2013-07-29: 10:00:00 via INTRAVENOUS

## 2013-07-29 MED ORDER — SODIUM CHLORIDE 0.9 % IV SOLN
676.0000 mg | Freq: Once | INTRAVENOUS | Status: AC
  Administered 2013-07-29: 680 mg via INTRAVENOUS
  Filled 2013-07-29: qty 68

## 2013-07-29 MED ORDER — SODIUM CHLORIDE 0.9 % IV SOLN
150.0000 mg | Freq: Once | INTRAVENOUS | Status: AC
  Administered 2013-07-29: 150 mg via INTRAVENOUS
  Filled 2013-07-29: qty 5

## 2013-07-29 MED ORDER — FAMOTIDINE IN NACL 20-0.9 MG/50ML-% IV SOLN
20.0000 mg | Freq: Once | INTRAVENOUS | Status: AC
  Administered 2013-07-29: 20 mg via INTRAVENOUS

## 2013-07-29 MED ORDER — PACLITAXEL CHEMO INJECTION 300 MG/50ML
175.0000 mg/m2 | Freq: Once | INTRAVENOUS | Status: AC
  Administered 2013-07-29: 348 mg via INTRAVENOUS
  Filled 2013-07-29: qty 58

## 2013-07-29 MED ORDER — ONDANSETRON 16 MG/50ML IVPB (CHCC)
16.0000 mg | Freq: Once | INTRAVENOUS | Status: AC
  Administered 2013-07-29: 16 mg via INTRAVENOUS

## 2013-07-29 MED ORDER — DEXAMETHASONE SODIUM PHOSPHATE 20 MG/5ML IJ SOLN
12.0000 mg | Freq: Once | INTRAMUSCULAR | Status: AC
  Administered 2013-07-29: 12 mg via INTRAVENOUS

## 2013-07-29 MED ORDER — DIPHENHYDRAMINE HCL 50 MG/ML IJ SOLN
50.0000 mg | Freq: Once | INTRAMUSCULAR | Status: AC
  Administered 2013-07-29: 50 mg via INTRAVENOUS

## 2013-07-29 NOTE — Telephone Encounter (Signed)
Called patient to remind of HDR Tx. For 07-30-13 at 1 pm, lvm for a return call

## 2013-07-29 NOTE — Patient Instructions (Addendum)
[facility] Discharge Instructions for Patients Receiving Chemotherapy  Today you received the following chemotherapy agents: taxol, carboplatin  To help prevent nausea and vomiting after your treatment, we encourage you to take your nausea medication.  Take it as often as prescribed.     If you develop nausea and vomiting that is not controlled by your nausea medication, call the clinic. If it is after clinic hours your family physician or the after hours number for the clinic or go to the Emergency Department.   BELOW ARE SYMPTOMS THAT SHOULD BE REPORTED IMMEDIATELY:  *FEVER GREATER THAN 100.5 F  *CHILLS WITH OR WITHOUT FEVER  NAUSEA AND VOMITING THAT IS NOT CONTROLLED WITH YOUR NAUSEA MEDICATION  *UNUSUAL SHORTNESS OF BREATH  *UNUSUAL BRUISING OR BLEEDING  TENDERNESS IN MOUTH AND THROAT WITH OR WITHOUT PRESENCE OF ULCERS  *URINARY PROBLEMS  *BOWEL PROBLEMS  UNUSUAL RASH Items with * indicate a potential emergency and should be followed up as soon as possible.  Feel free to call the clinic you have any questions or concerns. The clinic phone number is [phone].   I have been informed and understand all the instructions given to me. I know to contact the clinic, my physician, or go to the Emergency Department if any problems should occur. I do not have any questions at this time, but understand that I may call the clinic during office hours   should I have any questions or need assistance in obtaining follow up care.    __________________________________________  _____________  __________ Signature of Patient or Authorized Representative            Date                   Time    __________________________________________ Nurse's Signature    

## 2013-07-30 ENCOUNTER — Ambulatory Visit
Admission: RE | Admit: 2013-07-30 | Discharge: 2013-07-30 | Disposition: A | Discharge status: Home / Self Care | Payer: 59 | Source: Ambulatory Visit | Attending: Radiation Oncology | Admitting: Radiation Oncology

## 2013-07-30 ENCOUNTER — Ambulatory Visit (HOSPITAL_BASED_OUTPATIENT_CLINIC_OR_DEPARTMENT_OTHER): Payer: 59

## 2013-07-30 ENCOUNTER — Ambulatory Visit: Payer: 59

## 2013-07-30 VITALS — BP 133/79 | HR 80 | Temp 97.2°F | Resp 18

## 2013-07-30 DIAGNOSIS — C549 Malignant neoplasm of corpus uteri, unspecified: Secondary | ICD-10-CM

## 2013-07-30 DIAGNOSIS — Z5189 Encounter for other specified aftercare: Secondary | ICD-10-CM

## 2013-07-30 DIAGNOSIS — C541 Malignant neoplasm of endometrium: Secondary | ICD-10-CM

## 2013-07-30 MED ORDER — SODIUM CHLORIDE 0.9 % IV SOLN
1000.0000 mL | INTRAVENOUS | Status: DC
  Administered 2013-07-30: 10:00:00 via INTRAVENOUS

## 2013-07-30 MED ORDER — PEGFILGRASTIM INJECTION 6 MG/0.6ML
6.0000 mg | Freq: Once | SUBCUTANEOUS | Status: AC
  Administered 2013-07-30: 6 mg via SUBCUTANEOUS
  Filled 2013-07-30: qty 0.6

## 2013-07-30 NOTE — Progress Notes (Signed)
Department of Radiation Oncology  Phone: [phone]  Fax: [phone]   High-dose-rate brachytherapy procedure note   Simple treatment device note   Patient had construction of her custom vaginal cylinder for treatment. She will be treated with 5 vaginal rings, with a 3 cm cylinder placement proximally within the vaginal vault. This arrangement distended the vaginal vault without undue discomfort.   Vaginal brachytherapy procedure   The patient was placed on the high-dose-rate treatment table. A pelvic exam was performed showing the vaginal cuff to be intact without any palpable pelvic masses. The previously constructed vaginal cylinder was placed in the proximal vagina. The vaginal cylinder was affixed to the CT/MR stabilization plate to prevent slippage. A fiducial marker was placed within the vaginal cylinder for imaging purposes.   Verification simulation note   An AP and lateral film was obtained in the treatment position. This was compared to planning films earlier today documenting good position of the vaginal cylinder for treatment.   High-dose-rate brachytherapy treatment   The remote afterloading catheter was affixed to the vaginal cylinder. Patient then proceeded to undergo her third high-dose-rate treatment. She was prescribed a dose of 6 Gy to the mucosal surface. This was achieved with 1 channel using 7 dwell positions. Total treatment time was 348.7 seconds. The patient tolerated the procedure well. After completion of her therapy a radiation survey was performed documenting return of the iridium source into the Nucletron safe.  -----------------------------------   Billie Lade, PhD, MD

## 2013-07-30 NOTE — Patient Instructions (Addendum)
Dehydration, Adult Dehydration is when you lose more fluids from the body than you take in. Vital organs like the kidneys, brain, and heart cannot function without a proper amount of fluids and salt. Any loss of fluids from the body can cause dehydration.  CAUSES   Vomiting.  Diarrhea.  Excessive sweating.  Excessive urine output.  Fever. SYMPTOMS  Mild dehydration  Thirst.  Dry lips.  Slightly dry mouth. Moderate dehydration  Very dry mouth.  Sunken eyes.  Skin does not bounce back quickly when lightly pinched and released.  Dark urine and decreased urine production.  Decreased tear production.  Headache. Severe dehydration  Very dry mouth.  Extreme thirst.  Rapid, weak pulse (more than 100 beats per minute at rest).  Cold hands and feet.  Not able to sweat in spite of heat and temperature.  Rapid breathing.  Blue lips.  Confusion and lethargy.  Difficulty being awakened.  Minimal urine production.  No tears. DIAGNOSIS  Your caregiver will diagnose dehydration based on your symptoms and your exam. Blood and urine tests will help confirm the diagnosis. The diagnostic evaluation should also identify the cause of dehydration. TREATMENT  Treatment of mild or moderate dehydration can often be done at home by increasing the amount of fluids that you drink. It is best to drink small amounts of fluid more often. Drinking too much at one time can make vomiting worse. Refer to the home care instructions below. Severe dehydration needs to be treated at the hospital where you will probably be given intravenous (IV) fluids that contain water and electrolytes. HOME CARE INSTRUCTIONS   Ask your caregiver about specific rehydration instructions.  Drink enough fluids to keep your urine clear or pale yellow.  Drink small amounts frequently if you have nausea and vomiting.  Eat as you normally do.  Avoid:  Foods or drinks high in sugar.  Carbonated  drinks.  Juice.  Extremely hot or cold fluids.  Drinks with caffeine.  Fatty, greasy foods.  Alcohol.  Tobacco.  Overeating.  Gelatin desserts.  Wash your hands well to avoid spreading bacteria and viruses.  Only take over-the-counter or prescription medicines for pain, discomfort, or fever as directed by your caregiver.  Ask your caregiver if you should continue all prescribed and over-the-counter medicines.  Keep all follow-up appointments with your caregiver. SEEK MEDICAL CARE IF:  You have abdominal pain and it increases or stays in one area (localizes).  You have a rash, stiff neck, or severe headache.  You are irritable, sleepy, or difficult to awaken.  You are weak, dizzy, or extremely thirsty. SEEK IMMEDIATE MEDICAL CARE IF:   You are unable to keep fluids down or you get worse despite treatment.  You have frequent episodes of vomiting or diarrhea.  You have blood or green matter (bile) in your vomit.  You have blood in your stool or your stool looks black and tarry.  You have not urinated in 6 to 8 hours, or you have only urinated a small amount of very dark urine.  You have a fever.  You faint. MAKE SURE YOU:   Understand these instructions.  Will watch your condition.  Will get help right away if you are not doing well or get worse. Document Released: 11/21/2005 Document Revised: 02/13/2012 Document Reviewed: 07/11/2011 [facility] Patient Information 2014 [place], [place].   Pegfilgrastim injection What is this medicine? PEGFILGRASTIM (peg fil GRA stim) helps the body make more white blood cells. It is used to prevent infection in  people with low amounts of white blood cells following cancer treatment. This medicine may be used for other purposes; ask your health care provider or pharmacist if you have questions. What should I tell my health care provider before I take this medicine? They need to know if you have any of these  conditions: -sickle cell disease -an unusual or allergic reaction to pegfilgrastim, filgrastim, E.coli protein, other medicines, foods, dyes, or preservatives -pregnant or trying to get pregnant -breast-feeding How should I use this medicine? This medicine is for injection under the skin. It is usually given by a health care professional in a hospital or clinic setting. If you get this medicine at home, you will be taught how to prepare and give this medicine. Do not shake this medicine. Use exactly as directed. Take your medicine at regular intervals. Do not take your medicine more often than directed. It is important that you put your used needles and syringes in a special sharps container. Do not put them in a trash can. If you do not have a sharps container, call your pharmacist or healthcare provider to get one. Talk to your pediatrician regarding the use of this medicine in children. While this drug may be prescribed for children who weigh more than 45 kg for selected conditions, precautions do apply Overdosage: If you think you have taken too much of this medicine contact a poison control center or emergency room at once. NOTE: This medicine is only for you. Do not share this medicine with others. What if I miss a dose? If you miss a dose, take it as soon as you can. If it is almost time for your next dose, take only that dose. Do not take double or extra doses. What may interact with this medicine? -lithium -medicines for growth therapy This list may not describe all possible interactions. Give your health care provider a list of all the medicines, herbs, non-prescription drugs, or dietary supplements you use. Also tell them if you smoke, drink alcohol, or use illegal drugs. Some items may interact with your medicine. What should I watch for while using this medicine? Visit your doctor for regular check ups. You will need important blood work done while you are taking this medicine. What  side effects may I notice from receiving this medicine? Side effects that you should report to your doctor or health care professional as soon as possible: -allergic reactions like skin rash, itching or hives, swelling of the face, lips, or tongue -breathing problems -fever -pain, redness, or swelling where injected -shoulder pain -stomach or side pain Side effects that usually do not require medical attention (report to your doctor or health care professional if they continue or are bothersome): -aches, pains -headache -loss of appetite -nausea, vomiting -unusually tired This list may not describe all possible side effects. Call your doctor for medical advice about side effects. You may report side effects to FDA at 1-800-FDA-1088. Where should I keep my medicine? Keep out of the reach of children. Store in a refrigerator between 2 and 8 degrees C (36 and 46 degrees F). Do not freeze. Keep in carton to protect from light. Throw away this medicine if it is left out of the refrigerator for more than 48 hours. Throw away any unused medicine after the expiration date. NOTE: This sheet is a summary. It may not cover all possible information. If you have questions about this medicine, talk to your doctor, pharmacist, or health care provider.  2013, [employer]. (  06/23/2008 3:41:44 PM)

## 2013-08-06 ENCOUNTER — Encounter: Payer: Self-pay | Admitting: Radiation Oncology

## 2013-08-06 NOTE — Progress Notes (Signed)
  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   She did have some mild diarrhea which was controlled with Imodium. The patient also had some fatigue but was able to continue working throughout her course of treatment.  Plan: The patient has completed radiation treatment. The patient will return to radiation oncology clinic for routine followup in one month. I advised them to call or return sooner if they have any questions or concerns related to their recovery or treatment.  -----------------------------------  Billie Lade, PhD, MD

## 2013-08-14 ENCOUNTER — Ambulatory Visit: Payer: 59 | Admitting: Oncology

## 2013-08-14 ENCOUNTER — Ambulatory Visit (HOSPITAL_BASED_OUTPATIENT_CLINIC_OR_DEPARTMENT_OTHER): Payer: 59 | Admitting: Oncology

## 2013-08-14 ENCOUNTER — Telehealth: Payer: Self-pay | Admitting: *Deleted

## 2013-08-14 ENCOUNTER — Other Ambulatory Visit (HOSPITAL_BASED_OUTPATIENT_CLINIC_OR_DEPARTMENT_OTHER): Payer: 59 | Admitting: Lab

## 2013-08-14 VITALS — BP 120/82 | HR 90 | Temp 97.6°F | Resp 18 | Ht 64.0 in | Wt 187.5 lb

## 2013-08-14 DIAGNOSIS — C541 Malignant neoplasm of endometrium: Secondary | ICD-10-CM

## 2013-08-14 DIAGNOSIS — C549 Malignant neoplasm of corpus uteri, unspecified: Secondary | ICD-10-CM

## 2013-08-14 LAB — COMPREHENSIVE METABOLIC PANEL (CC13)
ALT: 26 U/L (ref 0–55)
AST: 20 U/L (ref 5–34)
Albumin: 3.6 g/dL (ref 3.5–5.0)
CO2: 24 mEq/L (ref 22–29)
Calcium: 9.2 mg/dL (ref 8.4–10.4)
Chloride: 107 mEq/L (ref 98–109)
Creatinine: 0.7 mg/dL (ref 0.6–1.1)
Potassium: 3.8 mEq/L (ref 3.5–5.1)

## 2013-08-14 LAB — CBC WITH DIFFERENTIAL/PLATELET
BASO%: 0.4 % (ref 0.0–2.0)
Basophils Absolute: 0 10*3/uL (ref 0.0–0.1)
EOS%: 0.4 % (ref 0.0–7.0)
HCT: 29.5 % — ABNORMAL LOW (ref 34.8–46.6)
HGB: 10.1 g/dL — ABNORMAL LOW (ref 11.6–15.9)
MCH: 31.4 pg (ref 25.1–34.0)
MCHC: 34.4 g/dL (ref 31.5–36.0)
MONO#: 0.6 10*3/uL (ref 0.1–0.9)
NEUT%: 76.3 % (ref 38.4–76.8)
RDW: 15.5 % — ABNORMAL HIGH (ref 11.2–14.5)
WBC: 4.3 10*3/uL (ref 3.9–10.3)
lymph#: 0.4 10*3/uL — ABNORMAL LOW (ref 0.9–3.3)

## 2013-08-14 NOTE — Patient Instructions (Signed)
Call if significant bleeding, such as nosebleed that will not stop  We will reschedule chemo from Sept 15 to Sept 22, as long as blood counts are good when we check again on Sept 19.

## 2013-08-14 NOTE — Telephone Encounter (Signed)
appts made and printed..KS gv pt appt for Dr.Gehrig...td

## 2013-08-14 NOTE — Progress Notes (Signed)
OFFICE PROGRESS NOTE   08/14/2013   Physicians:P.Duard Brady;J.Kinard, Tower, Audrie Gallus, MD (PCP), K.Fogleman, A.Jordan, M.Magod, [facility]   INTERVAL HISTORY:  Patient is seen, alone for visit, in continuing attention to adjuvant taxol carboplatin chemotherapy for IIIC serous endometrial cancer, which was given in sandwich fashion with external beam RT and has been concomitant with HDR thru 07-30-13. She is due cycle 6 on 08-19-13, which is last planned chemotherapy, however counts are lower today so that the final chemotherapy treatment will likely need to be delayed until 08-26-13. Patient has been fatigued "wiped out for a week" after chemo, again some better now. She had more nausea after cycle 5, also for ~ a week, improved with antiemetics. She had numbness distal feet bilaterally after most recent chemo, this now just persistent tingling; she has had no peripheral neuropathy symptoms in hands. Bowels have moved more easily recently. We have discussed massage, exercise and shoe choices for neuropathy in feet. Taxol has been at 175 mg/m2 for each treatment thus far and we will consider dose reduction for cycle 6 depending on neuropathy symptoms by next week. She does not have PAC. She has recently required ~ 2 attempts to establish IV access for chemo, but still prefers peripheral access for final treatment.    ONCOLOGIC HISTORY Patient had been in usual very good health until vaginal bleeding beginning 11-28-12. She was seen in January by PCP Dr Milinda Antis, who had done her gyn exams previously, and sent to Dr Kirtland Bouchard.Fogleman. 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 She saw Dr Nelly Rout on 02-21-13 and was taken to total robotic hysterectomy/BSO/bilateral pelvic nodes, with frozen section showing grade 1 tumor with minimal myometrial involvement, such that paraaortic nodes were not  evaluated. She was hospitalized just 1.5 days and required no pain medication after DC home. Final pathology ([id number]), however, showed invasive with a well differentiated component and a high grade serous component. total size was 4.2 cm and invasion 0.3 cm where myometrium 1.8 cm thick; the serous component had LVSI and 3 of 7 pelvic nodes were involved with the serous component. She saw Dr Duard Brady postoperatively on 02-21-13, recovering well from the surgery. She had CT CAP on 02-25-13 with no findings of concern in the chest and no retroperitoneal adenopathy or other findings of concern for metastatic disease. Dr Roselind Messier recommended IMRT after 3 cycles of chemotherapy and vaginal vault brachytherapy during the final 3 cycles of chemotherapy. Patient had 3 cycles of q 3 week taxol carbo from 03-15-13 thru 04-26-13, with neulasta day 2 and additional IVF, then external beam RT completed 07-01-13;  chemo resumed with cycle 4 taxol carboplatin on 07-08-13, with HDR treatments x3 from 8-11 thru 07-30-13. Cycle 5 chemo was 07-29-13 with neulasta and IVF day 2, and cycle 6 will be delayed x 1 week due to thrombocytopenia.    Review of systems as above, also: No fever, no bleeding, no SOB with nonstrenuous activity but notices this with increase in walking or with stairs, no problems at IV site from last Rx. Remainder of 10 point Review of Systems negative/ unchanged.  Objective:  Vital signs in last 24 hours:  BP 120/82  Pulse 90  Temp(Src) 97.6 F (36.4 C) (Oral)  Resp 18  Ht 5\' 4"  (1.626 m)  Wt 187 lb 8 oz (85.049 kg)  BMI 32.17 kg/m2  Alert, oriented and appropriate. Ambulatory without difficulty.  Total alopecia  HEENT:PERRL, sclerae not icteric. Oral mucosa moist without lesions, posterior pharynx clear.  Neck supple. No JVD. Lymphatics no cervical,suraclavicular, axillary or inguinal adenopathy Resp: clear to auscultation bilaterally and normal percussion bilaterally Cardio: regular rate and  rhythm. No gallop. GI: soft, nontender, not distended, no mass or organomegaly, some BS. Surgical incision not remarkable. Extremities: without pitting edema, cords, tenderness and musculoskeletal otherwise unremarkable Neuro:  peripheral neuropathy distal feet, hands ok  Skin without rash, ecchymosis, petechiae    Lab Results:  Results for orders placed in visit on 08/14/13  CBC WITH DIFFERENTIAL      Result Value Range   WBC 4.3  3.9 - 10.3 10e3/uL   NEUT# 3.3  1.5 - 6.5 10e3/uL   HGB 10.1 (*) 11.6 - 15.9 g/dL   HCT 16.1 (*) 09.6 - 04.5 %   Platelets 71 (*) 145 - 400 10e3/uL   MCV 91.2  79.5 - 101.0 fL   MCH 31.4  25.1 - 34.0 pg   MCHC 34.4  31.5 - 36.0 g/dL   RBC 4.09 (*) 8.11 - 9.14 10e6/uL   RDW 15.5 (*) 11.2 - 14.5 %   lymph# 0.4 (*) 0.9 - 3.3 10e3/uL   MONO# 0.6  0.1 - 0.9 10e3/uL   Eosinophils Absolute 0.0  0.0 - 0.5 10e3/uL   Basophils Absolute 0.0  0.0 - 0.1 10e3/uL   NEUT% 76.3  38.4 - 76.8 %   LYMPH% 9.7 (*) 14.0 - 49.7 %   MONO% 13.2  0.0 - 14.0 %   EOS% 0.4  0.0 - 7.0 %   BASO% 0.4  0.0 - 2.0 %  COMPREHENSIVE METABOLIC PANEL (CC13)      Result Value Range   Sodium 140  136 - 145 mEq/L   Potassium 3.8  3.5 - 5.1 mEq/L   Chloride 107  98 - 109 mEq/L   CO2 24  22 - 29 mEq/L   Glucose 94  70 - 140 mg/dl   BUN 78.2  7.0 - 95.6 mg/dL   Creatinine 0.7  0.6 - 1.1 mg/dL   Total Bilirubin 2.13  0.20 - 1.20 mg/dL   Alkaline Phosphatase 101  40 - 150 U/L   AST 20  5 - 34 U/L   ALT 26  0 - 55 U/L   Total Protein 7.2  6.4 - 8.3 g/dL   Albumin 3.6  3.5 - 5.0 g/dL   Calcium 9.2  8.4 - 08.6 mg/dL     Studies/Results:  No results found. Previous CT CAP was in [facility] system 02-25-13  Medications: I have reviewed the patient's current medications.  Assessment/Plan:  1.IIIC high grade serous endometrial carcinoma: history as above, continuing chemo and HDR. She will have total of 6 cycles of chemo, with last vaginal vault brachytherapy 07-30-13. She will need repeat CT  AP and return visit to Dr Duard Brady after treatment is completed. With platelets just 70K today, will hold chemo 9-15; I will see her 14-19 with repeat CBC and follow up of the peripheral neuropathy symptoms then, to adjust taxol dose with cycle 6 on 9-22 if needed. We will use neulasta and IVF on day 2 each cycle.  2.Constipation: improved. She understands that she may have to increase senokot S after each chemotherapy treatment.  3. Nausea continue EMEND with premeds and IVF day 2.  4.history of migraines, but tolerates zofran without difficulty.  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

## 2013-08-15 ENCOUNTER — Encounter: Payer: Self-pay | Admitting: Oncology

## 2013-08-16 ENCOUNTER — Ambulatory Visit: Payer: 59 | Admitting: Oncology

## 2013-08-17 ENCOUNTER — Other Ambulatory Visit: Payer: Self-pay | Admitting: Oncology

## 2013-08-19 ENCOUNTER — Ambulatory Visit: Payer: 59

## 2013-08-19 ENCOUNTER — Other Ambulatory Visit: Payer: 59 | Admitting: Lab

## 2013-08-20 ENCOUNTER — Ambulatory Visit: Payer: 59

## 2013-08-23 ENCOUNTER — Ambulatory Visit (HOSPITAL_BASED_OUTPATIENT_CLINIC_OR_DEPARTMENT_OTHER): Payer: 59 | Admitting: Oncology

## 2013-08-23 ENCOUNTER — Encounter: Payer: Self-pay | Admitting: Oncology

## 2013-08-23 ENCOUNTER — Telehealth: Payer: Self-pay | Admitting: Oncology

## 2013-08-23 ENCOUNTER — Other Ambulatory Visit (HOSPITAL_BASED_OUTPATIENT_CLINIC_OR_DEPARTMENT_OTHER): Payer: 59 | Admitting: Lab

## 2013-08-23 VITALS — BP 144/82 | HR 98 | Temp 98.9°F | Resp 18 | Ht 64.0 in | Wt 189.9 lb

## 2013-08-23 DIAGNOSIS — C549 Malignant neoplasm of corpus uteri, unspecified: Secondary | ICD-10-CM

## 2013-08-23 DIAGNOSIS — C541 Malignant neoplasm of endometrium: Secondary | ICD-10-CM

## 2013-08-23 LAB — CBC WITH DIFFERENTIAL/PLATELET
BASO%: 0.3 % (ref 0.0–2.0)
EOS%: 1.4 % (ref 0.0–7.0)
MCH: 32.2 pg (ref 25.1–34.0)
MCHC: 34.9 g/dL (ref 31.5–36.0)
MONO#: 0.8 10*3/uL (ref 0.1–0.9)
RBC: 3.22 10*6/uL — ABNORMAL LOW (ref 3.70–5.45)
RDW: 17.6 % — ABNORMAL HIGH (ref 11.2–14.5)
WBC: 5.4 10*3/uL (ref 3.9–10.3)
lymph#: 0.5 10*3/uL — ABNORMAL LOW (ref 0.9–3.3)

## 2013-08-23 NOTE — Patient Instructions (Signed)
Dr Denman George appointment will be Nov 6 instead of early October; we will repeat CT abdomen and pelvis shortly before her visit.

## 2013-08-23 NOTE — Progress Notes (Signed)
OFFICE PROGRESS NOTE   08/23/2013   Physicians:P.Duard Brady;J.Kinard, Tower, Audrie Gallus, MD (PCP), K.Fogleman, A.Jordan, M.Magod, [facility]   INTERVAL HISTORY:  Patient is seen, alone for visit, in continuing attention to adjuvant taxol carboplatin chemotherapy for IIIC serous endometrial cancer, which was given in sandwich fashion with external beam RT and has been concomitant with HDR thru 07-30-13. Cycle 6 was delayed from 08-19-13 due to counts, which have recovered now such that she will have this treatment 08-27-13. Peripheral neuropathy symptoms in feet after cycle 5 have improved in past week, now just minimal tingling noticeable at night and not interfering with function.  She does not have PAC.   ONCOLOGIC HISTORY Patient had been in usual very good health until vaginal bleeding beginning 11-28-12. She was seen in January by PCP Dr Milinda Antis, who had done her gyn exams previously, and sent to Dr Kirtland Bouchard.Fogleman. 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 She saw Dr Nelly Rout on 02-21-13 and was taken to total robotic hysterectomy/BSO/bilateral pelvic nodes, with frozen section showing grade 1 tumor with minimal myometrial involvement, such that paraaortic nodes were not evaluated. She was hospitalized just 1.5 days and required no pain medication after DC home. Final pathology ([id number]), however, showed invasive with a well differentiated component and a high grade serous component. total size was 4.2 cm and invasion 0.3 cm where myometrium 1.8 cm thick; the serous component had LVSI and 3 of 7 pelvic nodes were involved with the serous component. She saw Dr Duard Brady postoperatively on 02-21-13, recovering well from the surgery. She had CT CAP on 02-25-13 with no findings of concern in the chest and no retroperitoneal adenopathy or other findings of concern for metastatic disease. Dr  Roselind Messier recommended IMRT after 3 cycles of chemotherapy and vaginal vault brachytherapy during the final 3 cycles of chemotherapy. Patient had 3 cycles of q 3 week taxol carbo from 03-15-13 thru 04-26-13, with neulasta day 2 and additional IVF, then external beam RT completed 07-01-13; chemo resumed with cycle 4 taxol carboplatin on 07-08-13, with HDR treatments x3 from 8-11 thru 07-30-13. Cycle 5 chemo was 07-29-13 with neulasta and IVF day 2, and cycle 6 will be delayed x 1 week due to thrombocytopenia.   Review of systems as above, also: No peripheral neuropathy symptoms in hands. Still fatigued but has been able to work this week, going to bed early. No bleeding, symptoms of infection, respiratory problems and only very minimal nausea not requiring medication. Bowels ok. Remainder of 10 point Review of Systems negative.  Objective:  Vital signs in last 24 hours:  BP 144/82  Pulse 98  Temp(Src) 98.9 F (37.2 C) (Oral)  Resp 18  Ht 5\' 4"  (1.626 m)  Wt 189 lb 14.4 oz (86.138 kg)  BMI 32.58 kg/m2  Alert, oriented and appropriate. Easily ambulatory.  Complete alopecia  HEENT:PERRL, sclerae not icteric. Oral mucosa moist without lesions, posterior pharynx clear.  Neck supple. No JVD.  Lymphatics:no cervical,suraclavicular, axillary or inguinal adenopathy Resp: clear to auscultation bilaterally and normal percussion bilaterally Cardio: regular rate and rhythm. No gallop. GI: soft, nontender, not distended, no mass or organomegaly. Normally active bowel sounds. Surgical incision not remarkable. Musculoskeletal/ Extremities: without pitting edema, cords, tenderness Neuro: no peripheral neuropathy. Otherwise nonfocal Skin without rash, ecchymosis, petechiae   Lab Results:  Results for orders placed in visit on 08/23/13  CBC WITH DIFFERENTIAL      Result Value  Range   WBC 5.4  3.9 - 10.3 10e3/uL   NEUT# 4.0  1.5 - 6.5 10e3/uL   HGB 10.4 (*) 11.6 - 15.9 g/dL   HCT 16.1 (*) 09.6 - 04.5 %    Platelets 186  145 - 400 10e3/uL   MCV 92.2  79.5 - 101.0 fL   MCH 32.2  25.1 - 34.0 pg   MCHC 34.9  31.5 - 36.0 g/dL   RBC 4.09 (*) 8.11 - 9.14 10e6/uL   RDW 17.6 (*) 11.2 - 14.5 %   lymph# 0.5 (*) 0.9 - 3.3 10e3/uL   MONO# 0.8  0.1 - 0.9 10e3/uL   Eosinophils Absolute 0.1  0.0 - 0.5 10e3/uL   Basophils Absolute 0.0  0.0 - 0.1 10e3/uL   NEUT% 74.0  38.4 - 76.8 %   LYMPH% 8.9 (*) 14.0 - 49.7 %   MONO% 15.4 (*) 0.0 - 14.0 %   EOS% 1.4  0.0 - 7.0 %   BASO% 0.3  0.0 - 2.0 %   CMET done 08-14-13 noted  Studies/Results:  No results found.  Medications: I have reviewed the patient's current medications. We have discussed considerations for decreasing taxol dose, but with improvement in neuropathy symptoms in feet, will not change dose for last cycle.    Assessment/Plan: 1.IIIC high grade serous endometrial carcinoma: history as above, for last planned taxol carboplatin on 08-26-13. She will have neulasta and IVF on day 2 as previously. I will see her ~ 3 weeks after this treatment and she will see Dr Duard Brady in November, with CT AP shortly prior to her visit. 2.Constipation: managed with laxatives around chemotherapy  3. Nausea: continue EMEND with premeds and IVF day 2.  4.history of migraines, but tolerates zofran without difficulty.  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 7.no flu vaccine yet 8.colonoscopy done by Dr Ewing Schlein prior to this cancer diagnosis, due again 10 years 9. Due mammograms at [facility] Jan 2015  Patient is in agreement with plan above. Jamil Armwood P, MD   08/23/2013, 2:40 PM

## 2013-08-24 ENCOUNTER — Other Ambulatory Visit: Payer: Self-pay | Admitting: Oncology

## 2013-08-26 ENCOUNTER — Ambulatory Visit (HOSPITAL_BASED_OUTPATIENT_CLINIC_OR_DEPARTMENT_OTHER): Payer: 59

## 2013-08-26 VITALS — BP 146/90 | HR 118 | Temp 97.8°F

## 2013-08-26 DIAGNOSIS — C541 Malignant neoplasm of endometrium: Secondary | ICD-10-CM

## 2013-08-26 DIAGNOSIS — Z5111 Encounter for antineoplastic chemotherapy: Secondary | ICD-10-CM

## 2013-08-26 DIAGNOSIS — C549 Malignant neoplasm of corpus uteri, unspecified: Secondary | ICD-10-CM

## 2013-08-26 MED ORDER — FAMOTIDINE IN NACL 20-0.9 MG/50ML-% IV SOLN
20.0000 mg | Freq: Once | INTRAVENOUS | Status: AC
  Administered 2013-08-26: 20 mg via INTRAVENOUS

## 2013-08-26 MED ORDER — ONDANSETRON 16 MG/50ML IVPB (CHCC)
16.0000 mg | Freq: Once | INTRAVENOUS | Status: AC
  Administered 2013-08-26: 16 mg via INTRAVENOUS

## 2013-08-26 MED ORDER — SODIUM CHLORIDE 0.9 % IV SOLN
600.0000 mg | Freq: Once | INTRAVENOUS | Status: AC
  Administered 2013-08-26: 600 mg via INTRAVENOUS
  Filled 2013-08-26: qty 60

## 2013-08-26 MED ORDER — DIPHENHYDRAMINE HCL 50 MG/ML IJ SOLN
INTRAMUSCULAR | Status: AC
  Filled 2013-08-26: qty 1

## 2013-08-26 MED ORDER — DIPHENHYDRAMINE HCL 50 MG/ML IJ SOLN
50.0000 mg | Freq: Once | INTRAMUSCULAR | Status: AC
  Administered 2013-08-26: 50 mg via INTRAVENOUS

## 2013-08-26 MED ORDER — ONDANSETRON 16 MG/50ML IVPB (CHCC)
INTRAVENOUS | Status: AC
  Filled 2013-08-26: qty 16

## 2013-08-26 MED ORDER — FAMOTIDINE IN NACL 20-0.9 MG/50ML-% IV SOLN
INTRAVENOUS | Status: AC
  Filled 2013-08-26: qty 50

## 2013-08-26 MED ORDER — DEXAMETHASONE SODIUM PHOSPHATE 20 MG/5ML IJ SOLN
12.0000 mg | Freq: Once | INTRAMUSCULAR | Status: AC
  Administered 2013-08-26: 12 mg via INTRAVENOUS

## 2013-08-26 MED ORDER — DEXAMETHASONE SODIUM PHOSPHATE 20 MG/5ML IJ SOLN
INTRAMUSCULAR | Status: AC
  Filled 2013-08-26: qty 5

## 2013-08-26 MED ORDER — SODIUM CHLORIDE 0.9 % IV SOLN
150.0000 mg | Freq: Once | INTRAVENOUS | Status: AC
  Administered 2013-08-26: 150 mg via INTRAVENOUS
  Filled 2013-08-26: qty 5

## 2013-08-26 MED ORDER — PACLITAXEL CHEMO INJECTION 300 MG/50ML
175.0000 mg/m2 | Freq: Once | INTRAVENOUS | Status: AC
  Administered 2013-08-26: 348 mg via INTRAVENOUS
  Filled 2013-08-26: qty 58

## 2013-08-26 MED ORDER — SODIUM CHLORIDE 0.9 % IV SOLN
Freq: Once | INTRAVENOUS | Status: AC
  Administered 2013-08-26: 09:00:00 via INTRAVENOUS

## 2013-08-26 NOTE — Patient Instructions (Addendum)
[facility] Discharge Instructions for Patients Receiving Chemotherapy  Today you received the following chemotherapy agents:  Taxol and Carboplatin  To help prevent nausea and vomiting after your treatment, we encourage you to take your nausea medication as ordered per MD.   If you develop nausea and vomiting that is not controlled by your nausea medication, call the clinic.   BELOW ARE SYMPTOMS THAT SHOULD BE REPORTED IMMEDIATELY:  *FEVER GREATER THAN 100.5 F  *CHILLS WITH OR WITHOUT FEVER  NAUSEA AND VOMITING THAT IS NOT CONTROLLED WITH YOUR NAUSEA MEDICATION  *UNUSUAL SHORTNESS OF BREATH  *UNUSUAL BRUISING OR BLEEDING  TENDERNESS IN MOUTH AND THROAT WITH OR WITHOUT PRESENCE OF ULCERS  *URINARY PROBLEMS  *BOWEL PROBLEMS  UNUSUAL RASH Items with * indicate a potential emergency and should be followed up as soon as possible.  Feel free to call the clinic you have any questions or concerns. The clinic phone number is [phone].    

## 2013-08-27 ENCOUNTER — Ambulatory Visit (HOSPITAL_BASED_OUTPATIENT_CLINIC_OR_DEPARTMENT_OTHER): Payer: 59

## 2013-08-27 VITALS — BP 132/80 | HR 103 | Temp 98.9°F | Resp 20

## 2013-08-27 DIAGNOSIS — Z5189 Encounter for other specified aftercare: Secondary | ICD-10-CM

## 2013-08-27 DIAGNOSIS — C541 Malignant neoplasm of endometrium: Secondary | ICD-10-CM

## 2013-08-27 DIAGNOSIS — C549 Malignant neoplasm of corpus uteri, unspecified: Secondary | ICD-10-CM

## 2013-08-27 MED ORDER — SODIUM CHLORIDE 0.9 % IV SOLN
1000.0000 mL | Freq: Once | INTRAVENOUS | Status: AC
  Administered 2013-08-27: 1000 mL via INTRAVENOUS

## 2013-08-27 MED ORDER — PEGFILGRASTIM INJECTION 6 MG/0.6ML
6.0000 mg | Freq: Once | SUBCUTANEOUS | Status: AC
  Administered 2013-08-27: 6 mg via SUBCUTANEOUS
  Filled 2013-08-27: qty 0.6

## 2013-08-27 NOTE — Patient Instructions (Addendum)
Dehydration, Adult Dehydration is when you lose more fluids from the body than you take in. Vital organs like the kidneys, brain, and heart cannot function without a proper amount of fluids and salt. Any loss of fluids from the body can cause dehydration.  CAUSES   Vomiting.  Diarrhea.  Excessive sweating.  Excessive urine output.  Fever. SYMPTOMS  Mild dehydration  Thirst.  Dry lips.  Slightly dry mouth. Moderate dehydration  Very dry mouth.  Sunken eyes.  Skin does not bounce back quickly when lightly pinched and released.  Dark urine and decreased urine production.  Decreased tear production.  Headache. Severe dehydration  Very dry mouth.  Extreme thirst.  Rapid, weak pulse (more than 100 beats per minute at rest).  Cold hands and feet.  Not able to sweat in spite of heat and temperature.  Rapid breathing.  Blue lips.  Confusion and lethargy.  Difficulty being awakened.  Minimal urine production.  No tears. DIAGNOSIS  Your caregiver will diagnose dehydration based on your symptoms and your exam. Blood and urine tests will help confirm the diagnosis. The diagnostic evaluation should also identify the cause of dehydration. TREATMENT  Treatment of mild or moderate dehydration can often be done at home by increasing the amount of fluids that you drink. It is best to drink small amounts of fluid more often. Drinking too much at one time can make vomiting worse. Refer to the home care instructions below. Severe dehydration needs to be treated at the hospital where you will probably be given intravenous (IV) fluids that contain water and electrolytes. HOME CARE INSTRUCTIONS   Ask your caregiver about specific rehydration instructions.  Drink enough fluids to keep your urine clear or pale yellow.  Drink small amounts frequently if you have nausea and vomiting.  Eat as you normally do.  Avoid:  Foods or drinks high in sugar.  Carbonated  drinks.  Juice.  Extremely hot or cold fluids.  Drinks with caffeine.  Fatty, greasy foods.  Alcohol.  Tobacco.  Overeating.  Gelatin desserts.  Wash your hands well to avoid spreading bacteria and viruses.  Only take over-the-counter or prescription medicines for pain, discomfort, or fever as directed by your caregiver.  Ask your caregiver if you should continue all prescribed and over-the-counter medicines.  Keep all follow-up appointments with your caregiver. SEEK MEDICAL CARE IF:  You have abdominal pain and it increases or stays in one area (localizes).  You have a rash, stiff neck, or severe headache.  You are irritable, sleepy, or difficult to awaken.  You are weak, dizzy, or extremely thirsty. SEEK IMMEDIATE MEDICAL CARE IF:   You are unable to keep fluids down or you get worse despite treatment.  You have frequent episodes of vomiting or diarrhea.  You have blood or green matter (bile) in your vomit.  You have blood in your stool or your stool looks black and tarry.  You have not urinated in 6 to 8 hours, or you have only urinated a small amount of very dark urine.  You have a fever.  You faint. MAKE SURE YOU:   Understand these instructions.  Will watch your condition.  Will get help right away if you are not doing well or get worse. Document Released: 11/21/2005 Document Revised: 02/13/2012 Document Reviewed: 07/11/2011 Exit[employer] Patient Information 2014 [employer].  

## 2013-08-29 ENCOUNTER — Other Ambulatory Visit: Payer: Self-pay | Admitting: Family Medicine

## 2013-09-04 ENCOUNTER — Encounter: Payer: Self-pay | Admitting: Oncology

## 2013-09-05 ENCOUNTER — Encounter: Payer: Self-pay | Admitting: Radiation Oncology

## 2013-09-05 ENCOUNTER — Ambulatory Visit
Admission: RE | Admit: 2013-09-05 | Discharge: 2013-09-05 | Disposition: A | Discharge status: Home / Self Care | Payer: 59 | Source: Ambulatory Visit | Attending: Radiation Oncology | Admitting: Radiation Oncology

## 2013-09-05 VITALS — BP 141/83 | HR 98 | Temp 98.0°F | Ht 64.0 in | Wt 188.4 lb

## 2013-09-05 DIAGNOSIS — C541 Malignant neoplasm of endometrium: Secondary | ICD-10-CM

## 2013-09-05 NOTE — Progress Notes (Addendum)
Jane Ruiz here for follow up after treatment to her pelvis.  She denies pain but does have numbness and tingling in her feet especially at night.  She does have fatigue.  She denies vaginal bleeding and discharge.  She had her last chemotherapy a week and a half ago.  She denies diarrhea.

## 2013-09-05 NOTE — Progress Notes (Signed)
Medium vaginal dilator given.  Instructed patient to use dilator three times a week on Monday, Wednesday and Friday.  Advised her to use it at bedtime and to lay flat for 10 minutes and then to remove and cleanse the dilator with soap and water.

## 2013-09-05 NOTE — Progress Notes (Signed)
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  She is doing reasonably well at this time. She continues to have some fatigue. She recently completed another cycle of chemotherapy. She denies any vaginal bleeding or rectal bleeding or problems with diarrhea this time.                               ALLERGIES:  has No Known Allergies.  Meds: Current Outpatient Prescriptions  Medication Sig Dispense Refill  . atorvastatin (LIPITOR) 10 MG tablet TAKE 1 TABLET (10 MG TOTAL) BY MOUTH EVERY EVENING.  90 tablet  1  . calcium carbonate (ANTACID EXTRA STRENGTH) 750 MG chewable tablet Chew 2 tablets by mouth 2 (two) times daily.       Marland Kitchen loperamide (IMODIUM) 2 MG capsule Take 2 mg by mouth 4 (four) times daily as needed for diarrhea or loose stools.      Marland Kitchen LORazepam (ATIVAN) 1 MG tablet Take 1/2 to 1 tablet under the tongue or swallow every 4-6 hours as needed for nausea.  Will relax you and make you drowsy.  20 tablet  0  . Multiple Vitamin (MULTIVITAMIN) tablet Take 1 tablet by mouth daily.       . Omega-3 Fatty Acids (FISH OIL) 1200 MG CAPS Take 1 capsule by mouth daily.       . ondansetron (ZOFRAN) 8 MG tablet Take 1-2 tablets (8-16 mg total) by mouth every 8 (eight) hours as needed for nausea.  30 tablet  2  . polyethylene glycol powder (GLYCOLAX/MIRALAX) powder Take 17 g by mouth 2 (two) times daily as needed.  255 g  1  . sennosides-docusate sodium (SENOKOT-S) 8.6-50 MG tablet Take 1-2 tablets by mouth 2 (two) times daily as needed for constipation.      Marland Kitchen dexamethasone (DECADRON) 4 MG tablet Take 5 tabs = 20 mg 12 hrs. And 6 hrs. with food  prior to taxol  chemotherapy.  20 tablet  0   No current facility-administered medications for this encounter.    Physical Findings: The patient is in no acute distress. Patient is alert and oriented.  height is 5\' 4"  (1.626 m) and weight is 188 lb 6.4 oz (85.458 kg). Her temperature is 98 F (36.7 C). Her blood pressure is 141/83 and her pulse is 98. Marland Kitchen  No palpable supraclavicular or or axillary adenopathy. The lungs are clear to auscultation. The heart has a regular rhythm and rate. Abdomen is soft and nontender with normal bowel sounds.  Lab Findings: Lab Results  Component Value Date   WBC 5.4 08/23/2013   HGB 10.4* 08/23/2013   HCT 29.7* 08/23/2013   MCV 92.2 08/23/2013   PLT 186 08/23/2013      Radiographic Findings: No results found.  Impression:  The patient is recovering from the effects of radiation.    Plan:  Routine followup in February of 2015 which will be 3 months after her medical oncology followup appointment.  She is scheduled for repeat imaging later this fall. The patient was given a vaginal dilator and instructions on its use.  _____________________________________  -----------------------------------  Billie Lade, PhD, MD

## 2013-09-12 ENCOUNTER — Ambulatory Visit: Payer: 59 | Admitting: Gynecologic Oncology

## 2013-09-16 ENCOUNTER — Ambulatory Visit: Payer: 59

## 2013-09-16 ENCOUNTER — Encounter (INDEPENDENT_AMBULATORY_CARE_PROVIDER_SITE_OTHER): Payer: Self-pay

## 2013-09-16 ENCOUNTER — Encounter: Payer: Self-pay | Admitting: Oncology

## 2013-09-16 ENCOUNTER — Telehealth: Payer: Self-pay | Admitting: *Deleted

## 2013-09-16 ENCOUNTER — Other Ambulatory Visit (HOSPITAL_BASED_OUTPATIENT_CLINIC_OR_DEPARTMENT_OTHER): Payer: 59 | Admitting: Lab

## 2013-09-16 ENCOUNTER — Ambulatory Visit (HOSPITAL_BASED_OUTPATIENT_CLINIC_OR_DEPARTMENT_OTHER): Payer: 59 | Admitting: Oncology

## 2013-09-16 VITALS — BP 124/80 | HR 91 | Temp 98.3°F | Resp 18 | Ht 64.0 in | Wt 187.5 lb

## 2013-09-16 DIAGNOSIS — C541 Malignant neoplasm of endometrium: Secondary | ICD-10-CM

## 2013-09-16 DIAGNOSIS — D6481 Anemia due to antineoplastic chemotherapy: Secondary | ICD-10-CM

## 2013-09-16 DIAGNOSIS — Z1231 Encounter for screening mammogram for malignant neoplasm of breast: Secondary | ICD-10-CM

## 2013-09-16 DIAGNOSIS — G620 Drug-induced polyneuropathy: Secondary | ICD-10-CM

## 2013-09-16 DIAGNOSIS — K5909 Other constipation: Secondary | ICD-10-CM

## 2013-09-16 DIAGNOSIS — C549 Malignant neoplasm of corpus uteri, unspecified: Secondary | ICD-10-CM

## 2013-09-16 DIAGNOSIS — Z23 Encounter for immunization: Secondary | ICD-10-CM

## 2013-09-16 DIAGNOSIS — R110 Nausea: Secondary | ICD-10-CM

## 2013-09-16 LAB — CBC WITH DIFFERENTIAL/PLATELET
BASO%: 0.4 % (ref 0.0–2.0)
Basophils Absolute: 0 10*3/uL (ref 0.0–0.1)
Eosinophils Absolute: 0.1 10*3/uL (ref 0.0–0.5)
HGB: 10.3 g/dL — ABNORMAL LOW (ref 11.6–15.9)
LYMPH%: 8.4 % — ABNORMAL LOW (ref 14.0–49.7)
MCH: 31.6 pg (ref 25.1–34.0)
MCV: 93.4 fL (ref 79.5–101.0)
MONO%: 16.7 % — ABNORMAL HIGH (ref 0.0–14.0)
NEUT#: 4.1 10*3/uL (ref 1.5–6.5)
Platelets: 144 10*3/uL — ABNORMAL LOW (ref 145–400)
RBC: 3.25 10*6/uL — ABNORMAL LOW (ref 3.70–5.45)

## 2013-09-16 LAB — COMPREHENSIVE METABOLIC PANEL (CC13)
ALT: 18 U/L (ref 0–55)
AST: 16 U/L (ref 5–34)
Albumin: 3.5 g/dL (ref 3.5–5.0)
Alkaline Phosphatase: 84 U/L (ref 40–150)
Anion Gap: 8 mEq/L (ref 3–11)
BUN: 10.8 mg/dL (ref 7.0–26.0)
CO2: 26 mEq/L (ref 22–29)
Potassium: 3.6 mEq/L (ref 3.5–5.1)
Sodium: 142 mEq/L (ref 136–145)
Total Protein: 7.1 g/dL (ref 6.4–8.3)

## 2013-09-16 MED ORDER — INFLUENZA VAC SPLIT QUAD 0.5 ML IM SUSP: 0.5000 mL | INTRAMUSCULAR | Status: DC

## 2013-09-16 MED ORDER — INFLUENZA VAC SPLIT QUAD 0.5 ML IM SUSP
0.5000 mL | Freq: Once | INTRAMUSCULAR | Status: AC
  Administered 2013-09-16: 0.5 mL via INTRAMUSCULAR
  Filled 2013-09-16: qty 0.5

## 2013-09-16 NOTE — Telephone Encounter (Signed)
appts made and printed. gv appt for [facility] 11/21/13@ 9:15am...td

## 2013-09-16 NOTE — Patient Instructions (Signed)
Massage feet regularly. Wear comfortable shoes with good support. Avoid extended walking or standing on pavement or concrete floors.  Call if problems prior to next scheduled visit.

## 2013-09-16 NOTE — Progress Notes (Signed)
OFFICE PROGRESS NOTE   09/16/2013   Physicians:P.Duard Brady;J.Kinard, Tower, Audrie Gallus, MD (PCP), K.Fogleman, A.Jordan, M.Magod, [facility]   INTERVAL HISTORY:  Patient is seen, alone for visit, in continuing attention to adjuvant taxol carboplatin chemotherapy for IIIC serous endometrial carcinoma, having completed six cycles on 08-26-13. The chemotherapy was given in sandwich fashion with RT. She has had more distal peripheral neuropathy in feet since cycle 6 chemo, with tingling in distal feet bilaterally. She was very fatigued while shopping 2 days ago, tho was able to work the week prior. Taxol aches have resolved and she has minimal nausea now. She has had no SOB at rest, no fever, no bleeding.  Peripheral venous access for last chemo was not difficult;lshe does not have PAC.  ONCOLOGIC HISTORY Patient had been in usual very good health until vaginal bleeding beginning 11-28-12. She was seen in January by Dr Milinda Antis and sent to Dr Kirtland Bouchard.Fogleman. She had endometrial biopsy on 01-06-13 which showed endometroid adenocarcinoma with mucinous features; she had continuous spotting after that biopsy. She saw Dr Nelly Rout on 02-21-13 and was taken to total robotic hysterectomy/BSO/bilateral pelvic nodes, with frozen section showing grade 1 tumor with minimal myometrial involvement, such that paraaortic nodes were not evaluated. She was hospitalized just 1.5 days and required no pain medication after DC home. Final pathology ([id number]), however, showed invasive with a well differentiated component and a high grade serous component. total size was 4.2 cm and invasion 0.3 cm where myometrium 1.8 cm thick; the serous component had LVSI and 3 of 7 pelvic nodes were involved with the serous component. CT CAP on 02-25-13 had no findings of concern in the chest and no retroperitoneal adenopathy or other findings of concern for metastatic disease. Dr Roselind Messier recommended IMRT after 3 cycles of chemotherapy and vaginal vault  brachytherapy during the final 3 cycles of chemotherapy. Patient had 3 cycles of q 3 week taxol carbo from 03-15-13 thru 04-26-13, with neulasta day 2 and additional IVF, then external beam RT completed 07-01-13; chemo resumed with cycle 4 taxol carboplatin on 07-08-13, with HDR treatments x3 from 8-11 thru 07-30-13. Cycle 6 was delayed x 1 week due to thrombocytopenia, given on 08-26-13, with neulasta.    Review of systems as above, also: Bowels moving 1-2 x daily. Minimal occasional queasiness now, but has eaten cereal and is drinking water this AM. No taxol aches now. Feels slightly hoarse this AM without sore throat or lower respiratory symptoms. No swelling LE. No bleeding. No fever or symptoms of infection. Remainder of 10 point Review of Systems negative.  Objective:  Vital signs in last 24 hours:  BP 124/80  Pulse 91  Temp(Src) 98.3 F (36.8 C) (Oral)  Resp 18  Ht 5\' 4"  (1.626 m)  Wt 187 lb 8 oz (85.049 kg)  BMI 32.17 kg/m2  SpO2 98% Weight is down 1 lb. Alert, oriented and appropriate. Ambulatory without difficulty.  Alopecia  HEENT:PERRL, sclerae not icteric. Oral mucosa moist without lesions, posterior pharynx clear.  Neck supple. No JVD.  Lymphatics:no cervical,suraclavicular, axillary or inguinal adenopathy Resp: clear to auscultation bilaterally and normal percussion bilaterally Cardio: regular rate and rhythm. No gallop. GI: soft, nontender, not distended, no mass or organomegaly. Normally active bowel sounds. Surgical incision not remarkable. Musculoskeletal/ Extremities: without pitting edema, cords, tenderness Neuro: peripheral neuropathy metatarsal heads to toes bilaterally. Otherwise nonfocal Skin without rash, ecchymosis, petechiae. No problems at site of most recent peripheral IV.   Lab Results:  Results for orders placed in  visit on 09/16/13  CBC WITH DIFFERENTIAL      Result Value Range   WBC 5.5  3.9 - 10.3 10e3/uL   NEUT# 4.1  1.5 - 6.5 10e3/uL   HGB 10.3  (*) 11.6 - 15.9 g/dL   HCT 09.8 (*) 11.9 - 14.7 %   Platelets 144 (*) 145 - 400 10e3/uL   MCV 93.4  79.5 - 101.0 fL   MCH 31.6  25.1 - 34.0 pg   MCHC 33.8  31.5 - 36.0 g/dL   RBC 8.29 (*) 5.62 - 1.30 10e6/uL   RDW 18.2 (*) 11.2 - 14.5 %   lymph# 0.5 (*) 0.9 - 3.3 10e3/uL   MONO# 0.9  0.1 - 0.9 10e3/uL   Eosinophils Absolute 0.1  0.0 - 0.5 10e3/uL   Basophils Absolute 0.0  0.0 - 0.1 10e3/uL   NEUT% 73.3  38.4 - 76.8 %   LYMPH% 8.4 (*) 14.0 - 49.7 %   MONO% 16.7 (*) 0.0 - 14.0 %   EOS% 1.2  0.0 - 7.0 %   BASO% 0.4  0.0 - 2.0 %     Studies/Results:  For CT AP 10-07-13  Medications: I have reviewed the patient's current medications.  Flu vaccine given today.   DISCUSSION: she is anxious to be feeling better, but really is just 3 weeks out from extensive treatment. Hopefully she will be able to see improvement gradually over next several weeks, but will still need to pace herself for now. All of this carefully discussed and patient more calm and no longer tearful by completion of visit. Discussed neuropathy symptoms in feet as noted.  Assessment/Plan:  1.IIIC high grade serous endometrial carcinoma: history as above, six cycles of  taxol carboplatin completed on 08-26-13. she will see Dr Duard Brady in November, with CT AP shortly prior to her visit, and is to see Dr Roselind Messier in Jan. As she is still fatigued and not seeing much improvement since completion of treatment, I will check on her including repeat labs in Dec.  2.Constipation: managed with laxatives around chemotherapy  3. Nausea: just prn antiemetics now  4.history of migraines, but tolerates zofran without difficulty.  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 Discussed massage and avoiding extending standing or walking on pavement/ concrete floors. 7. flu vaccine today 8.colonoscopy done by Dr Ewing Schlein  prior to this cancer diagnosis, due again 10 years  9. Due mamm[facility] 2015. Breast tissue not extremely dense on last imaging in this EMR (11-2011)  Patient is in agreement with plan and knows that she can call at any time if needed prior to scheduled visit.  LIVESAY,LENNIS P, MD   09/16/2013, 8:36 AM

## 2013-09-17 ENCOUNTER — Encounter: Payer: Self-pay | Admitting: Oncology

## 2013-09-17 ENCOUNTER — Other Ambulatory Visit: Payer: Self-pay | Admitting: Oncology

## 2013-09-17 NOTE — Progress Notes (Signed)
[facility] END OF TREATMENT   Name: Jane Ruiz Date: 09/17/2013 MRN: [id number] DOB: 07/22/1961   TREATMENT DATES: 03-15-2013 thru 08-26-2013   REFERRING PHYSICIAN: Cleda Mccreedy  DIAGNOSIS: high grade serous endometrial carcinoma  STAGE AT START OF TREATMENT: IIIC   INTENT: curative   DRUGS OR REGIMENS GIVEN: taxol carboplatin q 3 weeks x 6 cycles, given in sandwich fashion with external beam RT and concomitant with HDR.   MAJOR TOXICITIES: fatigue, taxol aches, cytopenias, sensory neuropathy in feet following cycle 6   REASON TREATMENT STOPPED: completion of planned course   PERFORMANCE STATUS AT END: 1   ONGOING PROBLEMS: peripheral neuropathy in feet, fatigue, mild anemia   FOLLOW UP PLANS:restaging CT prior to gyn oncology follow up visit in Nov 2014, medical oncology visit with labs Dec 2014, radiation oncology visit Jan 2015.

## 2013-10-07 ENCOUNTER — Encounter (HOSPITAL_COMMUNITY): Payer: Self-pay

## 2013-10-07 ENCOUNTER — Ambulatory Visit (HOSPITAL_COMMUNITY)
Admission: RE | Admit: 2013-10-07 | Discharge: 2013-10-07 | Disposition: A | Discharge status: Home / Self Care | Payer: 59 | Source: Ambulatory Visit | Attending: Oncology | Admitting: Oncology

## 2013-10-07 DIAGNOSIS — E278 Other specified disorders of adrenal gland: Secondary | ICD-10-CM | POA: Insufficient documentation

## 2013-10-07 DIAGNOSIS — C549 Malignant neoplasm of corpus uteri, unspecified: Secondary | ICD-10-CM | POA: Insufficient documentation

## 2013-10-07 DIAGNOSIS — C541 Malignant neoplasm of endometrium: Secondary | ICD-10-CM

## 2013-10-07 MED ORDER — IOHEXOL 300 MG/ML  SOLN
100.0000 mL | Freq: Once | INTRAMUSCULAR | Status: AC | PRN
  Administered 2013-10-07: 100 mL via INTRAVENOUS

## 2013-10-10 ENCOUNTER — Encounter: Payer: Self-pay | Admitting: Gynecologic Oncology

## 2013-10-10 ENCOUNTER — Ambulatory Visit: Payer: 59 | Attending: Gynecologic Oncology | Admitting: Gynecologic Oncology

## 2013-10-10 VITALS — BP 118/70 | HR 78 | Temp 99.0°F | Resp 18 | Ht 64.84 in | Wt 188.7 lb

## 2013-10-10 DIAGNOSIS — C541 Malignant neoplasm of endometrium: Secondary | ICD-10-CM

## 2013-10-10 DIAGNOSIS — C549 Malignant neoplasm of corpus uteri, unspecified: Secondary | ICD-10-CM | POA: Insufficient documentation

## 2013-10-10 DIAGNOSIS — Z923 Personal history of irradiation: Secondary | ICD-10-CM | POA: Insufficient documentation

## 2013-10-10 DIAGNOSIS — Z9079 Acquired absence of other genital organ(s): Secondary | ICD-10-CM | POA: Insufficient documentation

## 2013-10-10 DIAGNOSIS — Z90710 Acquired absence of both cervix and uterus: Secondary | ICD-10-CM | POA: Insufficient documentation

## 2013-10-10 DIAGNOSIS — Z9221 Personal history of antineoplastic chemotherapy: Secondary | ICD-10-CM | POA: Insufficient documentation

## 2013-10-10 NOTE — Patient Instructions (Signed)
Follow up with Dr. Darrold Span as scheduled. She will see Dr. Roselind Messier in February of 2015 and will return to see GYN oncology in May of 2015.

## 2013-10-10 NOTE — Progress Notes (Signed)
Consult Note: Gyn-Onc  Jane Ruiz 52 y.o. female  CC:  Chief Complaint  Patient presents with  . Endometrial cancer    Follow up    HPI:52 year old post-menopausal female who experienced vaginal bleeding beginning 11-28-12. She was seen in January by Dr Jane Ruiz and sent to Dr Jane Ruiz. She had endometrial biopsy on 01-06-13 which showed endometroid adenocarcinoma with mucinous features; she had continuous spotting after that biopsy. She saw Dr Jane Ruiz on 02-21-13 and was taken to total robotic hysterectomy/BSO/bilateral pelvic nodes, with frozen section showing grade 1 tumor with minimal myometrial involvement, such that paraaortic nodes were not evaluated. She was hospitalized just 1.5 days and required no pain medication after DC home. Final pathology (Jane Ruiz), however, showed invasive with a well differentiated component and a high grade serous component. total size was 4.2 cm and invasion 0.3 cm where myometrium 1.8 cm thick; the serous component had LVSI and 3 of 7 pelvic nodes were involved with the serous component.   CT CAP on 02-25-13 had no findings of concern in the chest and no retroperitoneal adenopathy or other findings of concern for metastatic disease. Dr Jane Ruiz recommended IMRT after 3 cycles of chemotherapy and vaginal vault brachytherapy during the final 3 cycles of chemotherapy. Patient had 3 cycles of q 3 week taxol carbo from 03-15-13 thru 04-26-13, with neulasta day 2 and additional IVF, then external beam RT completed 07-01-13; chemo resumed with cycle 4 taxol carboplatin on 07-08-13, with HDR treatments x3 from 8-11 thru 07-30-13. Cycle 6 was delayed x 1 week due to thrombocytopenia, given on 08-26-13, with neulasta.   She was last seen by Dr. Roselind Ruiz on October 2 and by Dr. Darrold Ruiz October 14. She had a CT scan performed on November 3 that revealed:  There is no focal abnormality in the liver or spleen. Small hiatal hernia is stable. Stomach is decompressed. The duodenum,  pancreas, gallbladder, and left adrenal gland are unremarkable. 13 mm right adrenal gland is stable. Tiny low-density lesion in the lower pole the right kidney Stable. Left kidney is unremarkable. No evidence for hydronephrosis or hydroureter on either side. No abdominal aortic aneurysm. There is no lymphadenopathy in the abdomen. No free abdominal fluid. Imaging through the pelvis shows no free intraperitoneal fluid. No pelvic sidewall lymphadenopathy. The small fluid collection seen along the left external iliac vasculature previously has resolved in the interval.   The terminal ileum and the appendix are normal. The proximal and mid sigmoid segments of the colon are position in the central anatomic pelvis in show some mild inner wall enhancement the lumen in this portion of the colon is relatively fixed in diameter compared to other areas of the colon. No definite evidence for stricture although the rectum is decompressed. Uterus and ovaries are surgically absent. Bone windows reveal no worrisome lytic or sclerotic osseous lesions.   IMPRESSION:  No evidence for lymphadenopathy or metastatic disease. The small fluid collection along the left pelvic sidewall seen previously has resolved in the interval. Fixed diameter of the proximal and mid sigmoid colon with slight hyper enhancement of the wall. This may reflect a degree of radiation change there is no evidence for overt distal colonic stricture at this time although the lumen does appear to be diffusely narrowed.   Stable 13 mm right adrenal nodule. Washout characteristics are compatible with benign adrenal adenoma.  Interval history: She's overall doing fairly well she is starting to feel more like herself. She stay she had a cold for  a couple days after seen Dr. Darrold Ruiz and she feels is taking her longer to her back. She did have some neuropathy in the bottom of her feet but that is improving and now feels like "pins and needles". She never had any  neuropathy in her hands. She is using her vaginal dilators as directed. She is also sexually active. She does require lubricants.  Review of Systems:  Constitutional: Feels tired with no energy during the day. However, this is steadily improving.  Denies fever. Skin: No rash, sores, jaundice, itching, or dryness.  Cardiovascular: No chest pain, shortness of breath, or edema  Pulmonary: No cough or wheeze.  Gastro Intestinal: No nausea, vomiting, constipation, or diarrhea reported. No bright red blood per rectum or change in bowel movement.  Genitourinary: No frequency, urgency, or dysuria.  Denies vaginal bleeding and discharge.  Musculoskeletal: No myalgia, arthralgia, joint swelling or pain.  Neurologic: No weakness, numbness, or change in gait.  Psychology: No changes   Current Meds:  Outpatient Encounter Prescriptions as of 10/10/2013  Medication Sig  . atorvastatin (LIPITOR) 10 MG tablet TAKE 1 TABLET (10 MG TOTAL) BY MOUTH EVERY EVENING.  . calcium carbonate (TUMS EX) 750 MG chewable tablet Chew 2 tablets by mouth 2 (two) times daily.  Marland Kitchen loperamide (IMODIUM) 2 MG capsule Take 2 mg by mouth 4 (four) times daily as needed for diarrhea or loose stools.  . Multiple Vitamin (MULTIVITAMIN) tablet Take 1 tablet by mouth daily.   . Omega-3 Fatty Acids (FISH OIL) 1200 MG CAPS Take 1 capsule by mouth daily.   Marland Kitchen LORazepam (ATIVAN) 1 MG tablet Take 1/2 to 1 tablet under the tongue or swallow every 4-6 hours as needed for nausea.  Will relax you and make you drowsy.  . ondansetron (ZOFRAN) 8 MG tablet Take 1-2 tablets (8-16 mg total) by mouth every 8 (eight) hours as needed for nausea.  . polyethylene glycol powder (GLYCOLAX/MIRALAX) powder Take 17 g by mouth 2 (two) times daily as needed.  . sennosides-docusate sodium (SENOKOT-S) 8.6-50 MG tablet Take 1-2 tablets by mouth 2 (two) times daily as needed for constipation.    Allergy: No Known Allergies  Social Hx:   History   Social History   . Marital Status: Married    Spouse Name: N/A    Number of Children: N/A  . Years of Education: N/A   Occupational History  . Not on file.   Social History Main Topics  . Smoking status: Never Smoker   . Smokeless tobacco: Never Used  . Alcohol Use: No  . Drug Use: No  . Sexual Activity: Yes     Comment: menarche age 49, G29,  P64, menopause 2006   Other Topics Concern  . Not on file   Social History Narrative  . No narrative on file    Past Surgical Hx:  Past Surgical History  Procedure Laterality Date  . Rhinoplasty      breathing problems- Dr Haroldine Laws  . Robotic assisted total hysterectomy with bilateral salpingo oopherectomy N/A 02/12/2013    Procedure: ROBOTIC ASSISTED TOTAL HYSTERECTOMY WITH BILATERAL SALPINGO OOPHORECTOMY,  LYMPH NODES;  Surgeon: Rejeana Brock A. Duard Brady, MD;  Location: [facility];  Service: Gynecology;  Laterality: N/A;  . Abdominal hysterectomy  02/12/13    Past Medical Hx:  Past Medical History  Diagnosis Date  . Migraine   . Hyperlipidemia     borderline high cholesterol  . Skin lesion     non maligant skin lesions on head  .  Cancer 02/12/13    endometrial, positive nodes  . History of radiation therapy 05/27/2013-07/01/2013    pelvis 45 gray in 25 fractions, 63 gray with high dose brachytherapy    Oncology Hx:    Endometrial cancer   01/07/2013 Initial Diagnosis Endometrial cancer   02/12/2013 Surgery IIIC1 endometrioid grade 3   03/15/2013 - 08/26/2013 Chemotherapy Completed 6 cycles of paclitaxel and carboplatin with sandwich radiation therapy (completed 07/01/13). First thee cycles of chemotherapy 03/15/13-04/26/13 then last three cycles of chemotherapy 07/08/13 thru 08/26/13.    Family Hx:  Family History  Problem Relation Age of Onset  . Diabetes Mother   . Kidney disease Mother   . Benign prostatic hyperplasia Father   . Diabetes Maternal Grandmother     Vitals:  Blood pressure 118/70, pulse 78, temperature 99 F (37.2 C), resp. rate 18, height  5' 4.84" (1.647 m), weight 188 lb 11.2 oz (85.594 kg).  Physical Exam: Well-nourished well-developed female in no acute distress.  Neck: Supple, no lymphadenopathy no thyromegaly.  Lungs: Clear to auscultation bilaterally.  Cardiovascular: Regular rate and rhythm.  Abdomen: Well-healed laparoscopic incisions. Abdomen is soft, nontender, nondistended. There is no palpable masses. There is no hepatosplenomegaly.  Groins: No lymphadenopathy.  Extremities no edema.  Pelvic: Normal external female genitalia. Vagina is atrophic. Vaginal cuff is visualized. There are no visible lesions. Bimanual examination reveals no masses or nodularity. Rectal confirms.  Assessment/Plan: 46-year-old with IIIc 1 endometrial carcinoma. Serous histology. She's completed chemotherapy and radiation under "sandwich" fashion. Her last cycle of chemotherapy was in September 2014. Should a post treatment CT scan in November that was unremarkable. She has followup scheduled with Dr. Darrold Ruiz later this year. She will see Dr. Roselind Ruiz in February of 2015 and return to see me in May of 2015. She'll continue using her vaginal dilators. She knows to return to see [place] if she has any bleeding, pain or other symptoms.  Chaselynn Kepple A., MD 10/10/2013, 12:16 PM

## 2013-11-21 ENCOUNTER — Encounter: Payer: Self-pay | Admitting: Family Medicine

## 2013-11-22 ENCOUNTER — Encounter: Payer: Self-pay | Admitting: *Deleted

## 2013-11-25 ENCOUNTER — Encounter: Payer: Self-pay | Admitting: Oncology

## 2013-11-25 ENCOUNTER — Other Ambulatory Visit (HOSPITAL_BASED_OUTPATIENT_CLINIC_OR_DEPARTMENT_OTHER): Payer: 59

## 2013-11-25 ENCOUNTER — Ambulatory Visit (HOSPITAL_BASED_OUTPATIENT_CLINIC_OR_DEPARTMENT_OTHER): Payer: 59 | Admitting: Oncology

## 2013-11-25 VITALS — BP 124/82 | HR 82 | Temp 98.0°F | Resp 18 | Ht 64.84 in | Wt 185.5 lb

## 2013-11-25 DIAGNOSIS — C549 Malignant neoplasm of corpus uteri, unspecified: Secondary | ICD-10-CM

## 2013-11-25 DIAGNOSIS — C541 Malignant neoplasm of endometrium: Secondary | ICD-10-CM

## 2013-11-25 LAB — CBC WITH DIFFERENTIAL/PLATELET
BASO%: 0.9 % (ref 0.0–2.0)
Eosinophils Absolute: 0.4 10*3/uL (ref 0.0–0.5)
HCT: 37.1 % (ref 34.8–46.6)
LYMPH%: 8.8 % — ABNORMAL LOW (ref 14.0–49.7)
MCH: 29.9 pg (ref 25.1–34.0)
MCHC: 33 g/dL (ref 31.5–36.0)
MCV: 90.7 fL (ref 79.5–101.0)
MONO#: 0.6 10*3/uL (ref 0.1–0.9)
MONO%: 9.2 % (ref 0.0–14.0)
NEUT%: 74.8 % (ref 38.4–76.8)
Platelets: 221 10*3/uL (ref 145–400)
RBC: 4.09 10*6/uL (ref 3.70–5.45)

## 2013-11-25 LAB — COMPREHENSIVE METABOLIC PANEL (CC13)
ALT: 15 U/L (ref 0–55)
Alkaline Phosphatase: 81 U/L (ref 40–150)
Anion Gap: 10 mEq/L (ref 3–11)
BUN: 9.8 mg/dL (ref 7.0–26.0)
CO2: 27 mEq/L (ref 22–29)
Calcium: 9.5 mg/dL (ref 8.4–10.4)
Creatinine: 0.8 mg/dL (ref 0.6–1.1)
Glucose: 97 mg/dl (ref 70–140)
Sodium: 142 mEq/L (ref 136–145)
Total Bilirubin: 0.28 mg/dL (ref 0.20–1.20)
Total Protein: 7.1 g/dL (ref 6.4–8.3)

## 2013-11-25 NOTE — Patient Instructions (Signed)
Keep appointments with Drs Duard Brady, Kinard and Tower as scheduled.  Dr Darrold Span is glad to see you again at any time if you or other physicians would like, but will not have scheduled appointments with medical oncology set up now.

## 2013-11-25 NOTE — Progress Notes (Signed)
OFFICE PROGRESS NOTE   11/25/2013   Physicians:P.Duard Brady;J.Kinard, [place], Audrie Gallus, MD (PCP), K.Fogleman, A.Jordan, M.Magod, [facility]   INTERVAL HISTORY:  Patient is seen, alone for visit, in follow up of recent adjuvant taxol carboplatin chemotherapy for IIIC serous endometrial carcinoma, this given in sandwich fashion with RT from 03-15-13 thru 08-26-13. She saw Dr Roselind Messier 09-05-13, had CT AP 10-07-2013, and saw Dr Duard Brady 10-10-13. She has felt progressively better since completing treatment, with improvement in energy, GI symptoms and peripheral neuropathy. She does not have PAC. The CT did not have findings of concern for recurrent or metastatic cancer, but did have diffuse narrowing of proximal and mid sigmoid colon. Note she had normal colonoscopy by Dr Ewing Schlein at age 61, next recommended in 10 years.   She no longer notices any peripheral neuropathy in feet. Energy is much better, tho still gets tired by end of day. Very minimal sporadic nausea which does not require antiemetics. Bowels moving twice daily, which is actually preferable to pretreatment <= once daily.    ONCOLOGIC HISTORY   Endometrial cancer   01/07/2013 Initial Diagnosis Endometrial cancer   02/12/2013 Surgery IIIC1 endometrioid grade 3   03/15/2013 - 08/26/2013 Chemotherapy Completed 6 cycles of paclitaxel and carboplatin with sandwich radiation therapy (completed 07/01/13). First thee cycles of chemotherapy 03/15/13-04/26/13 then last three cycles of chemotherapy 07/08/13 thru 08/26/13.   Patient had been in usual very good health until vaginal bleeding beginning 11-28-12. She was seen in January by Dr Milinda Antis and sent to Dr Kirtland Bouchard.Fogleman. She had endometrial biopsy on 01-06-13 which showed endometroid adenocarcinoma with mucinous features; she had continuous spotting after that biopsy. She saw Dr Nelly Rout on 02-21-13 and was taken to total robotic hysterectomy/BSO/bilateral pelvic nodes, with frozen section showing grade 1 tumor with  minimal myometrial involvement, such that paraaortic nodes were not evaluated. She was hospitalized just 1.5 days and required no pain medication after DC home. Final pathology ([id number]), however, showed invasive with a well differentiated component and a high grade serous component. total size was 4.2 cm and invasion 0.3 cm where myometrium 1.8 cm thick; the serous component had LVSI and 3 of 7 pelvic nodes were involved with the serous component. CT CAP on 02-25-13 had no findings of concern in the chest and no retroperitoneal adenopathy or other findings of concern for metastatic disease. Dr Roselind Messier recommended IMRT after 3 cycles of chemotherapy and vaginal vault brachytherapy during the final 3 cycles of chemotherapy. Patient had 3 cycles of q 3 week taxol carbo from 03-15-13 thru 04-26-13, then external beam RT 45 completed 07-01-13; chemo resumed with cycle 4 taxol carboplatin on 07-08-13, with HDR treatments x3 from 8-11 thru 07-30-13 (4Gy to pelvis with proximal vagina boosted to 63Gy with HDR).Cycle 6 chemo was delayed x 1 week due to thrombocytopenia, given on 08-26-13. She required gCSF support (neulasta) and additional IVF with chemotherapy.   Review of systems as above, also: Skin lesion on right breast. Appetite better. No infectious illness. No bleeding. No abdominal or pelvic discomfort.  Remainder of 10 point Review of Systems negative.  Mother in law is hospitalized at [facility] with cardiac problems  Objective:  Vital signs in last 24 hours:  BP 124/82  Pulse 82  Temp(Src) 98 F (36.7 C) (Oral)  Resp 18  Ht 5' 4.84" (1.647 m)  Wt 185 lb 8 oz (84.142 kg)  BMI 31.02 kg/m2 weight is down 3 lbs, intentional  Alert, oriented and appropriate. Ambulatory without difficulty.  Hair is  growing back  HEENT:PERRL, sclerae not icteric. Oral mucosa moist without lesions, posterior pharynx clear.  Neck supple. No JVD.  Lymphatics:no cervical,suraclavicular, axillary or inguinal adenopathy Resp:  clear to auscultation bilaterally and normal percussion bilaterally Cardio: regular rate and rhythm. No gallop. GI: soft, nontender, not distended, no mass or organomegaly. Normally active bowel sounds. Surgical incision not remarkable. Musculoskeletal/ Extremities: without pitting edema, cords, tenderness Neuro: no peripheral neuropathy. Otherwise nonfocal Skin without rash, ecchymosis, petechiae. Right breast at 5:00 close to areola seborrheic keratosis, more pigmented medially than at circumference~ 10 x 7 mm Breasts: without dominant mass or nipple findings. Axillae benign.   Lab Results:  Results for orders placed in visit on 11/25/13  CBC WITH DIFFERENTIAL      Result Value Range   WBC 6.6  3.9 - 10.3 10e3/uL   NEUT# 4.9  1.5 - 6.5 10e3/uL   HGB 12.2  11.6 - 15.9 g/dL   HCT 16.1  09.6 - 04.5 %   Platelets 221  145 - 400 10e3/uL   MCV 90.7  79.5 - 101.0 fL   MCH 29.9  25.1 - 34.0 pg   MCHC 33.0  31.5 - 36.0 g/dL   RBC 4.09  8.11 - 9.14 10e6/uL   RDW 14.8 (*) 11.2 - 14.5 %   lymph# 0.6 (*) 0.9 - 3.3 10e3/uL   MONO# 0.6  0.1 - 0.9 10e3/uL   Eosinophils Absolute 0.4  0.0 - 0.5 10e3/uL   Basophils Absolute 0.1  0.0 - 0.1 10e3/uL   NEUT% 74.8  38.4 - 76.8 %   LYMPH% 8.8 (*) 14.0 - 49.7 %   MONO% 9.2  0.0 - 14.0 %   EOS% 6.3  0.0 - 7.0 %   BASO% 0.9  0.0 - 2.0 %    CMET available after visit normal with exception of K 3.4, including creat 0.8, normal LFTs, T prot and alb.  Studies/Results: CT ABDOMEN AND PELVIS WITH CONTRAST   10-07-13  COMPARISON: 02/25/2013  FINDINGS:  Images including the lung bases are unremarkable.  There is no focal abnormality in the liver or spleen. Small hiatal  hernia is stable. Stomach is decompressed. The duodenum, pancreas,  gallbladder, and left adrenal gland are unremarkable. 13 mm right  adrenal gland is stable. Tiny low-density lesion in the lower pole  the right kidney Stable. Left kidney is unremarkable. No evidence  for hydronephrosis  or hydroureter on either side.  No abdominal aortic aneurysm. There is no lymphadenopathy in the  abdomen. No free abdominal fluid.  Imaging through the pelvis shows no free intraperitoneal fluid. No  pelvic sidewall lymphadenopathy. The small fluid collection seen  along the left external iliac vasculature previously has resolved in  the interval.  The terminal ileum and the appendix are normal. The proximal and mid  sigmoid segments of the colon are position in the central anatomic  pelvis in show some mild inner wall enhancement the lumen in this  portion of the colon is relatively fixed in diameter compared to  other areas of the colon. No definite evidence for stricture  although the rectum is decompressed.  Uterus and ovaries are surgically absent.  Bone windows reveal no worrisome lytic or sclerotic osseous lesions.  IMPRESSION:  No evidence for lymphadenopathy or metastatic disease. The small  fluid collection along the left pelvic sidewall seen previously has  resolved in the interval.  Fixed diameter of the proximal and mid sigmoid colon with slight  hyper enhancement of the wall. This  may reflect a degree of  radiation change there is no evidence for overt distal colonic  stricture at this time although the lumen does appear to be  diffusely narrowed.  Stable 13 mm right adrenal nodule. Washout characteristics are  compatible with benign adrenal adenoma.   Bilateral screening mammograms [facility] 11-21-13 with scattered fibroglandular densities and no mammographic findings of concern.  Medications: I have reviewed the patient's current medications. She has had flu vaccine. She has not needed antiemetics in last few weeks, has zofran 8 mg and ativan 1 mg available.  DISCUSSION: CT AP 10-07-13 discussed, this showing proximal and mid sigmoid colon with narrowing possibly radiation related. She had unrermarkable colonoscopy at age 68 by Dr Ewing Schlein, next recommended in 10 years and,  as above, is not having any significant GI symptoms now.  She will discuss the Nov CT with Dr Roselind Messier at his visit in early Jan 2015, particularly to see if he feels this area likely radiation related and if he suggests follow up with Dr Ewing Schlein prior to scheduled repeat colonoscopy in several years.  Assessment/Plan: 1.IIIC high grade serous endometrial carcinoma: history as above, six cycles of taxol carboplatin completed on 08-26-13. She is recovering well now from chemotherapy, including resolution of peripheral neuropathy and recovery of blood counts. She will see Dr Roselind Messier on 12-12-13 and will discuss sigmoid colon findings on CT with him then; she is to see Dr Duard Brady next in May. As she will be alternating visits every 3 months with radiation oncology and gyn oncology, I will see her on prn basis now. She should have CBC checked by PCP or gyn oncology in ~ 6 months and then at least yearly. 2.Sigmoid colon narrowing on CT: probably related to RT but will have Dr Roselind Messier review at his upcoming visit. GI symptoms much improved, no new problems. Note she is established with Dr Ewing Schlein if this needs further evaluation. Normal colonoscopy at age 50. 3. Nausea: just prn antiemetics now  4.history of migraines, but tolerates zofran without difficulty.  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 flu vaccine done 8.seborrheic keratosis right breast. She is due back to Dr Amy [place] and may want to have this removed  9. Mammograms done at [facility] 11-21-13    Patient is comfortable with plan, has had questions answered to her satisfaction and understands that I am glad to see her again at any time if she or other MDs feel that I can help.    LIVESAY,LENNIS P, MD   11/25/2013, 8:57 AM

## 2013-12-05 ENCOUNTER — Telehealth: Payer: Self-pay | Admitting: Family Medicine

## 2013-12-05 DIAGNOSIS — Z0000 Encounter for general adult medical examination without abnormal findings: Secondary | ICD-10-CM

## 2013-12-05 DIAGNOSIS — E785 Hyperlipidemia, unspecified: Secondary | ICD-10-CM

## 2013-12-05 NOTE — Telephone Encounter (Signed)
Message copied by Abner Greenspan on Thu Dec 05, 2013  1:29 PM ------      Message from: Ellamae Sia      Created: Wed Nov 27, 2013 10:48 AM      Regarding: Lab orders for Friday, 1.2.15       Patient is scheduled for CPX labs, please order future labs, Thanks , Terri       ------

## 2013-12-06 ENCOUNTER — Other Ambulatory Visit (INDEPENDENT_AMBULATORY_CARE_PROVIDER_SITE_OTHER): Payer: 59

## 2013-12-06 DIAGNOSIS — E785 Hyperlipidemia, unspecified: Secondary | ICD-10-CM

## 2013-12-06 DIAGNOSIS — Z0000 Encounter for general adult medical examination without abnormal findings: Secondary | ICD-10-CM

## 2013-12-06 LAB — COMPREHENSIVE METABOLIC PANEL
ALBUMIN: 3.8 g/dL (ref 3.5–5.2)
ALT: 16 U/L (ref 0–35)
AST: 17 U/L (ref 0–37)
Alkaline Phosphatase: 67 U/L (ref 39–117)
BUN: 11 mg/dL (ref 6–23)
CALCIUM: 9.4 mg/dL (ref 8.4–10.5)
CHLORIDE: 105 meq/L (ref 96–112)
CO2: 27 meq/L (ref 19–32)
Creatinine, Ser: 0.8 mg/dL (ref 0.4–1.2)
GFR: 81.51 mL/min (ref >60.00)
GLUCOSE: 101 mg/dL — AB (ref 70–99)
POTASSIUM: 3.4 meq/L — AB (ref 3.5–5.1)
SODIUM: 140 meq/L (ref 135–145)
Total Bilirubin: 0.6 mg/dL (ref 0.3–1.2)
Total Protein: 6.8 g/dL (ref 6.0–8.3)

## 2013-12-06 LAB — LIPID PANEL
CHOLESTEROL: 165 mg/dL (ref 0–200)
HDL: 31.7 mg/dL — ABNORMAL LOW (ref >39.00)
LDL Cholesterol: 99 mg/dL (ref 0–99)
Total CHOL/HDL Ratio: 5
Triglycerides: 172 mg/dL — ABNORMAL HIGH (ref 0.0–149.0)
VLDL: 34.4 mg/dL (ref 0.0–40.0)

## 2013-12-06 LAB — CBC WITH DIFFERENTIAL/PLATELET
Basophils Absolute: 0 10*3/uL (ref 0.0–0.1)
Basophils Relative: 0.4 % (ref 0.0–3.0)
EOS PCT: 6.7 % — AB (ref 0.0–5.0)
Eosinophils Absolute: 0.4 10*3/uL (ref 0.0–0.7)
HEMATOCRIT: 35.1 % — AB (ref 36.0–46.0)
Hemoglobin: 11.7 g/dL — ABNORMAL LOW (ref 12.0–15.0)
LYMPHS ABS: 0.7 10*3/uL (ref 0.7–4.0)
Lymphocytes Relative: 11.1 % — ABNORMAL LOW (ref 12.0–46.0)
MCHC: 33.3 g/dL (ref 30.0–36.0)
MCV: 88.7 fl (ref 78.0–100.0)
MONO ABS: 0.7 10*3/uL (ref 0.1–1.0)
MONOS PCT: 10 % (ref 3.0–12.0)
Neutro Abs: 4.8 10*3/uL (ref 1.4–7.7)
Neutrophils Relative %: 71.8 % (ref 43.0–77.0)
Platelets: 225 10*3/uL (ref 150.0–400.0)
RBC: 3.96 Mil/uL (ref 3.87–5.11)
RDW: 14.5 % (ref 11.5–14.6)
WBC: 6.7 10*3/uL (ref 4.5–10.5)

## 2013-12-06 LAB — TSH: TSH: 2.76 u[IU]/mL (ref 0.35–5.50)

## 2013-12-12 ENCOUNTER — Other Ambulatory Visit (HOSPITAL_COMMUNITY)
Admission: RE | Admit: 2013-12-12 | Discharge: 2013-12-12 | Disposition: A | Discharge status: Home / Self Care | Payer: 59 | Source: Ambulatory Visit | Attending: Radiation Oncology | Admitting: Radiation Oncology

## 2013-12-12 ENCOUNTER — Encounter: Payer: Self-pay | Admitting: Radiation Oncology

## 2013-12-12 ENCOUNTER — Ambulatory Visit
Admission: RE | Admit: 2013-12-12 | Discharge: 2013-12-12 | Disposition: A | Discharge status: Home / Self Care | Payer: 59 | Source: Ambulatory Visit | Attending: Radiation Oncology | Admitting: Radiation Oncology

## 2013-12-12 VITALS — BP 133/84 | HR 89 | Temp 97.6°F | Ht 64.0 in | Wt 186.6 lb

## 2013-12-12 DIAGNOSIS — Z124 Encounter for screening for malignant neoplasm of cervix: Secondary | ICD-10-CM | POA: Insufficient documentation

## 2013-12-12 DIAGNOSIS — C541 Malignant neoplasm of endometrium: Secondary | ICD-10-CM

## 2013-12-12 NOTE — Progress Notes (Signed)
Radiation Oncology         ([phone]) [phone] ________________________________  Name: Jane Ruiz MRN: [id number]  Date: 12/12/2013  DOB: [birth year]  Follow-Up Visit Note  CC: Loura Pardon, MD  Gordy Levan, MD  Diagnosis:   Stage III-C serous carcinoma of the endometrium  Interval Since Last Radiation:  4  months, she completed 45 gray external beam directed at the pelvis followed by 3 intracavitary brachytherapy treatments.  cumulative dose to the vaginal cuff was 63 gray  Narrative:  The patient returns today for routine follow-up.  She is doing well this time. She has very minimal fatigue. 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 She continues to use her vaginal dilator as recommended. She denies any vaginal bleeding pelvic pain constipation or diarrhea. She denies any rectal bleeding or hematuria. Patient was noted to have these narrowing of the sigmoid colon area on recent CT scan. The patient will likely see her gastroenterologist again with possibly repeat colonoscopy.  She denies having to strain or change in stool caliber.                              ALLERGIES:  has No Known Allergies.  Meds: Current Outpatient Prescriptions  Medication Sig Dispense Refill  . atorvastatin (LIPITOR) 10 MG tablet TAKE 1 TABLET (10 MG TOTAL) BY MOUTH EVERY EVENING.  90 tablet  1  . calcium carbonate (TUMS EX) 750 MG chewable tablet Chew 2 tablets by mouth 2 (two) times daily.      . Multiple Vitamin (MULTIVITAMIN) tablet Take 1 tablet by mouth daily.       . Omega-3 Fatty Acids (FISH OIL) 1200 MG CAPS Take 1 capsule by mouth daily.       Marland Kitchen loperamide (IMODIUM) 2 MG capsule Take 2 mg by mouth 4 (four) times daily as needed for diarrhea or loose stools.      Marland Kitchen LORazepam (ATIVAN) 1 MG tablet Take 1/2 to 1 tablet under the tongue or swallow every 4-6 hours as needed for nausea.  Will relax you and make you drowsy.  20 tablet  0  . ondansetron (ZOFRAN) 8 MG tablet Take 1-2 tablets (8-16  mg total) by mouth every 8 (eight) hours as needed for nausea.  30 tablet  2  . polyethylene glycol powder (GLYCOLAX/MIRALAX) powder Take 17 g by mouth 2 (two) times daily as needed.  255 g  1  . sennosides-docusate sodium (SENOKOT-S) 8.6-50 MG tablet Take 1-2 tablets by mouth 2 (two) times daily as needed for constipation.       No current facility-administered medications for this encounter.    Physical Findings: The patient is in no acute distress. Patient is alert and oriented.  height is 5\' 4"  (1.626 m) and weight is 186 lb 9.6 oz (84.641 kg). Her temperature is 97.6 F (36.4 C). Her blood pressure is 133/84 and her pulse is 89. Her oxygen saturation is 98%. .  No palpable supraclavicular or axillary adenopathy. The lungs are clear to auscultation. The heart has regular rhythm and rate. The abdomen is soft and nontender with normal bowel sounds. There is no inguinal adenopathy appreciated. A pelvic examination external genitalia are unremarkable. A speculum exam is performed. There are no mucosal lesions noted in the vaginal vault. A Pap smear was obtained of the proximal vagina. On bimanual and rectovaginal examination there no pelvic masses appreciated..  Lab Findings: Lab Results  Component  Value Date   WBC 6.7 12/06/2013   HGB 11.7* 12/06/2013   HCT 35.1* 12/06/2013   MCV 88.7 12/06/2013   PLT 225.0 12/06/2013     Radiographic Findings: No results found.  Impression:   No evidence of recurrence on clinical exam today, Pap smear pending  Plan:  Routine followup in September. In the interim the patient will be seen by gynecologic oncology  ____________________________________ Blair Promise, MD

## 2013-12-12 NOTE — Progress Notes (Signed)
Jane Ruiz here for follow up after treatment for endometrial cancer.  She denies pain, vaginal and rectal bleeding or discharge.  She does have fatigue at night.  She had her last round of chemotherapy the last week of September.   She reports that she is using her dilator 3 times a week.

## 2013-12-13 ENCOUNTER — Encounter: Payer: Self-pay | Admitting: Family Medicine

## 2013-12-13 ENCOUNTER — Ambulatory Visit (INDEPENDENT_AMBULATORY_CARE_PROVIDER_SITE_OTHER): Payer: 59 | Admitting: Family Medicine

## 2013-12-13 VITALS — BP 128/86 | HR 82 | Temp 98.1°F | Ht 64.75 in | Wt 182.8 lb

## 2013-12-13 DIAGNOSIS — E669 Obesity, unspecified: Secondary | ICD-10-CM

## 2013-12-13 DIAGNOSIS — Z0000 Encounter for general adult medical examination without abnormal findings: Secondary | ICD-10-CM

## 2013-12-13 DIAGNOSIS — E785 Hyperlipidemia, unspecified: Secondary | ICD-10-CM

## 2013-12-13 MED ORDER — ATORVASTATIN CALCIUM 10 MG PO TABS: ORAL_TABLET | ORAL | Status: DC

## 2013-12-13 NOTE — Progress Notes (Signed)
Pre-visit discussion using our clinic review tool. No additional management support is needed unless otherwise documented below in the visit note.  

## 2013-12-13 NOTE — Progress Notes (Signed)
Subjective:    Patient ID: Jane Ruiz, female    DOB: [birth year], 53 y.o.   MRN: [id number]  HPI. Here for health maintenance exam and to review chronic medical problems    She is hanging in there - still tires very easily after chemo - but a bit better with time  Feet are less numb   She tries to take care of herself - is parking farther and increasing her exercise a bit at a time   Wt is down 4 lb with bmi of 30 Is eating a healthy diet   Pap yesterday from her radio oncologist  Has had tot hyst and tx for endometrial cancer  Flu vaccine 10/14  Mammogram 12/14 Self exam - no lumps noted - she does have a mole on her breast (oncologist)- seb keratosis  She has to see derm for yearly appt  Needs a breast exam today   Td 12/09  colonosc 7/10-has a follow up with GI upcoming - she had an area of poss radiation damage to colon on her CT most recent  Sees Dr Sarina Ser next month   K  Of 3.4     Chemistry      Component Value Date/Time   NA 140 12/06/2013 0755   NA 142 11/25/2013 0754   K 3.4* 12/06/2013 0755   K 3.4* 11/25/2013 0754   CL 105 12/06/2013 0755   CL 105 05/15/2013 1249   CO2 27 12/06/2013 0755   CO2 27 11/25/2013 0754   BUN 11 12/06/2013 0755   BUN 9.8 11/25/2013 0754   CREATININE 0.8 12/06/2013 0755   CREATININE 0.8 11/25/2013 0754      Component Value Date/Time   CALCIUM 9.4 12/06/2013 0755   CALCIUM 9.5 11/25/2013 0754   ALKPHOS 67 12/06/2013 0755   ALKPHOS 81 11/25/2013 0754   AST 17 12/06/2013 0755   AST 15 11/25/2013 0754   ALT 16 12/06/2013 0755   ALT 15 11/25/2013 0754   BILITOT 0.6 12/06/2013 0755   BILITOT 0.28 11/25/2013 0754     she eats bananas , no OJ , some fruit and greens   Glucose 101  Hyperlipidemia lipitor and diet Lab Results  Component Value Date   CHOL 165 12/06/2013   CHOL 152 11/23/2012   CHOL 158 10/21/2011   Lab Results  Component Value Date   HDL 31.70* 12/06/2013   HDL 34.30* 11/23/2012   HDL 42.80 10/21/2011   Lab Results    Component Value Date   LDLCALC 99 12/06/2013   LDLCALC 93 11/23/2012   LDLCALC 97 10/21/2011   Lab Results  Component Value Date   TRIG 172.0* 12/06/2013   TRIG 125.0 11/23/2012   TRIG 91.0 10/21/2011   Lab Results  Component Value Date   CHOLHDL 5 12/06/2013   CHOLHDL 4 11/23/2012   CHOLHDL 4 10/21/2011   Lab Results  Component Value Date   LDLDIRECT 172.4 12/24/2010   LDLDIRECT 174.9 09/17/2010   LDLDIRECT 164.2 07/06/2010   overall pretty stable  Not as much exercise with ca recovery   Lab Results  Component Value Date   TSH 2.76 12/06/2013         Review of Systems Review of Systems  Constitutional: Negative for fever, appetite change,  and unexpected weight change. pos for fatigue that is gradually improving  Eyes: Negative for pain and visual disturbance.  Respiratory: Negative for cough and shortness of breath.   Cardiovascular: Negative for cp or palpitations  Gastrointestinal: Negative for nausea, diarrhea and constipation.  Genitourinary: Negative for urgency and frequency.  Skin: Negative for pallor or rash   Neurological: Negative for weakness, light-headedness, numbness and headaches.  Hematological: Negative for adenopathy. Does not bruise/bleed easily.  Psychiatric/Behavioral: Negative for dysphoric mood. The patient is not nervous/anxious.         Objective:   Physical Exam  Constitutional: She appears well-developed and well-nourished. No distress.  obese and well appearing  Overall-has lost wt from previous  HENT:  Head: Normocephalic and atraumatic.  Right Ear: External ear normal.  Left Ear: External ear normal.  Mouth/Throat: Oropharynx is clear and moist.  Eyes: Conjunctivae and EOM are normal. Pupils are equal, round, and reactive to light. No scleral icterus.  Neck: Normal range of motion. Neck supple. No JVD present. Carotid bruit is not present. No thyromegaly present.  Cardiovascular: Normal rate, regular rhythm, normal heart sounds and  intact distal pulses.  Exam reveals no gallop.   Pulmonary/Chest: Effort normal and breath sounds normal. No respiratory distress. She has no wheezes. She exhibits no tenderness.  Abdominal: Soft. Bowel sounds are normal. She exhibits no distension, no abdominal bruit and no mass. There is no tenderness.  Genitourinary: No breast swelling, tenderness, discharge or bleeding.  Musculoskeletal: Normal range of motion. She exhibits no edema and no tenderness.  Lymphadenopathy:    She has no cervical adenopathy.  Neurological: She is alert. She has normal reflexes. No cranial nerve deficit. She exhibits normal muscle tone. Coordination normal.  Skin: Skin is warm and dry. No rash noted. No erythema. No pallor.  Few SK-one on R lower breast   Psychiatric: She has a normal mood and affect.          Assessment & Plan:

## 2013-12-13 NOTE — Patient Instructions (Addendum)
Your potassium is very slightly low  Eat a banana per day and lots of dark greens  To raise good cholesterol (HDL)- gradually increase exercise and eat fish when you can  Take care of yourself   Fat and Cholesterol Control Diet Fat and cholesterol levels in your blood and organs are influenced by your diet. High levels of fat and cholesterol may lead to diseases of the heart, small and large blood vessels, gallbladder, liver, and pancreas. CONTROLLING FAT AND CHOLESTEROL WITH DIET Although exercise and lifestyle factors are important, your diet is key. That is because certain foods are known to raise cholesterol and others to lower it. The goal is to balance foods for their effect on cholesterol and more importantly, to replace saturated and trans fat with other types of fat, such as monounsaturated fat, polyunsaturated fat, and omega-3 fatty acids. On average, a person should consume no more than 15 to 17 g of saturated fat daily. Saturated and trans fats are considered "bad" fats, and they will raise LDL cholesterol. Saturated fats are primarily found in animal products such as meats, butter, and cream. However, that does not mean you need to give up all your favorite foods. Today, there are good tasting, low-fat, low-cholesterol substitutes for most of the things you like to eat. Choose low-fat or nonfat alternatives. Choose round or loin cuts of red meat. These types of cuts are lowest in fat and cholesterol. Chicken (without the skin), fish, veal, and ground [place] breast are great choices. Eliminate fatty meats, such as hot dogs and salami. Even shellfish have little or no saturated fat. Have a 3 oz (85 g) portion when you eat lean meat, poultry, or fish. Trans fats are also called "partially hydrogenated oils." They are oils that have been scientifically manipulated so that they are solid at room temperature resulting in a longer shelf life and improved taste and texture of foods in which they are  added. Trans fats are found in stick margarine, some tub margarines, cookies, crackers, and baked goods.  When baking and cooking, oils are a great substitute for butter. The monounsaturated oils are especially beneficial since it is believed they lower LDL and raise HDL. The oils you should avoid entirely are saturated tropical oils, such as coconut and palm.  Remember to eat a lot from food groups that are naturally free of saturated and trans fat, including fish, fruit, vegetables, beans, grains (barley, rice, couscous, bulgur wheat), and pasta (without cream sauces).  IDENTIFYING FOODS THAT LOWER FAT AND CHOLESTEROL  Soluble fiber may lower your cholesterol. This type of fiber is found in fruits such as apples, vegetables such as broccoli, potatoes, and carrots, legumes such as beans, peas, and lentils, and grains such as barley. Foods fortified with plant sterols (phytosterol) may also lower cholesterol. You should eat at least 2 g per day of these foods for a cholesterol lowering effect.  Read package labels to identify low-saturated fats, trans fat free, and low-fat foods at the supermarket. Select cheeses that have only 2 to 3 g saturated fat per ounce. Use a heart-healthy tub margarine that is free of trans fats or partially hydrogenated oil. When buying baked goods (cookies, crackers), avoid partially hydrogenated oils. Breads and muffins should be made from whole grains (whole-wheat or whole oat flour, instead of "flour" or "enriched flour"). Buy non-creamy canned soups with reduced salt and no added fats.  FOOD PREPARATION TECHNIQUES  Never deep-fry. If you must fry, either stir-fry, which uses very  little fat, or use non-stick cooking sprays. When possible, broil, bake, or roast meats, and steam vegetables. Instead of putting butter or margarine on vegetables, use lemon and herbs, applesauce, and cinnamon (for squash and sweet potatoes). Use nonfat yogurt, salsa, and low-fat dressings for salads.   LOW-SATURATED FAT / LOW-FAT FOOD SUBSTITUTES Meats / Saturated Fat (g)  Avoid: Steak, marbled (3 oz/85 g) / 11 g  Choose: Steak, lean (3 oz/85 g) / 4 g  Avoid: Hamburger (3 oz/85 g) / 7 g  Choose: Hamburger, lean (3 oz/85 g) / 5 g  Avoid: Ham (3 oz/85 g) / 6 g  Choose: Ham, lean cut (3 oz/85 g) / 2.4 g  Avoid: Chicken, with skin, dark meat (3 oz/85 g) / 4 g  Choose: Chicken, skin removed, dark meat (3 oz/85 g) / 2 g  Avoid: Chicken, with skin, light meat (3 oz/85 g) / 2.5 g  Choose: Chicken, skin removed, light meat (3 oz/85 g) / 1 g Dairy / Saturated Fat (g)  Avoid: Whole milk (1 cup) / 5 g  Choose: Low-fat milk, 2% (1 cup) / 3 g  Choose: Low-fat milk, 1% (1 cup) / 1.5 g  Choose: Skim milk (1 cup) / 0.3 g  Avoid: Hard cheese (1 oz/28 g) / 6 g  Choose: Skim milk cheese (1 oz/28 g) / 2 to 3 g  Avoid: Cottage cheese, 4% fat (1 cup) / 6.5 g  Choose: Low-fat cottage cheese, 1% fat (1 cup) / 1.5 g  Avoid: Ice cream (1 cup) / 9 g  Choose: Sherbet (1 cup) / 2.5 g  Choose: Nonfat frozen yogurt (1 cup) / 0.3 g  Choose: Frozen fruit bar / trace  Avoid: Whipped cream (1 tbs) / 3.5 g  Choose: Nondairy whipped topping (1 tbs) / 1 g Condiments / Saturated Fat (g)  Avoid: Mayonnaise (1 tbs) / 2 g  Choose: Low-fat mayonnaise (1 tbs) / 1 g  Avoid: Butter (1 tbs) / 7 g  Choose: Extra light margarine (1 tbs) / 1 g  Avoid: Coconut oil (1 tbs) / 11.8 g  Choose: Olive oil (1 tbs) / 1.8 g  Choose: Corn oil (1 tbs) / 1.7 g  Choose: Safflower oil (1 tbs) / 1.2 g  Choose: Sunflower oil (1 tbs) / 1.4 g  Choose: Soybean oil (1 tbs) / 2.4 g  Choose: Canola oil (1 tbs) / 1 g Document Released: 11/21/2005 Document Revised: 03/18/2013 Document Reviewed: 05/12/2011 Exit[employer] Patient Information 2014 [place], [place].

## 2013-12-14 NOTE — Assessment & Plan Note (Signed)
Discussed how this problem influences overall health and the risks it imposes  Reviewed plan for weight loss with lower calorie diet (via better food choices and also portion control or program like weight watchers) and exercise building up to or more than 30 minutes 5 days per week including some aerobic activity    

## 2013-12-14 NOTE — Assessment & Plan Note (Addendum)
Disc goals for lipids and reasons to control them lipitor and diet  Rev labs with pt Rev low sat fat diet in detail

## 2013-12-14 NOTE — Assessment & Plan Note (Signed)
Reviewed health habits including diet and exercise and skin cancer prevention Reviewed appropriate screening tests for age  Also reviewed health mt list, fam hx and immunization status , as well as social and family history   Labs reviewed  

## 2014-04-17 ENCOUNTER — Encounter: Payer: Self-pay | Admitting: Gynecologic Oncology

## 2014-04-17 ENCOUNTER — Ambulatory Visit: Payer: 59 | Attending: Gynecologic Oncology | Admitting: Gynecologic Oncology

## 2014-04-17 VITALS — BP 126/76 | HR 69 | Temp 98.0°F | Resp 20 | Ht 64.0 in | Wt 184.1 lb

## 2014-04-17 DIAGNOSIS — C549 Malignant neoplasm of corpus uteri, unspecified: Secondary | ICD-10-CM | POA: Insufficient documentation

## 2014-04-17 DIAGNOSIS — Z90710 Acquired absence of both cervix and uterus: Secondary | ICD-10-CM | POA: Insufficient documentation

## 2014-04-17 DIAGNOSIS — Z9079 Acquired absence of other genital organ(s): Secondary | ICD-10-CM | POA: Insufficient documentation

## 2014-04-17 DIAGNOSIS — R5381 Other malaise: Secondary | ICD-10-CM | POA: Insufficient documentation

## 2014-04-17 DIAGNOSIS — C541 Malignant neoplasm of endometrium: Secondary | ICD-10-CM

## 2014-04-17 DIAGNOSIS — R5383 Other fatigue: Secondary | ICD-10-CM

## 2014-04-17 DIAGNOSIS — Z9221 Personal history of antineoplastic chemotherapy: Secondary | ICD-10-CM | POA: Insufficient documentation

## 2014-04-17 NOTE — Progress Notes (Signed)
Consult Note: Gyn-Onc  Jane Ruiz 53 y.o. female  CC:  Chief Complaint  Patient presents with  . Endo ca    Follow up     HPI:53 year old post-menopausal female who experienced vaginal bleeding beginning 11-28-12. She was seen in January by Dr Glori Bickers and sent to Dr Raliegh Ip.Fogleman. She had endometrial biopsy on 01-06-13 which showed endometroid adenocarcinoma with mucinous features; she had continuous spotting after that biopsy. She saw Dr Skeet Latch on 02-21-13 and was taken to total robotic hysterectomy/BSO/bilateral pelvic nodes, with frozen section showing grade 1 tumor with minimal myometrial involvement, such that paraaortic nodes were not evaluated. She was hospitalized just 1.5 days and required no pain medication after DC home. Final pathology ([id number]), however, showed invasive with a well differentiated component and a high grade serous component. total size was 4.2 cm and invasion 0.3 cm where myometrium 1.8 cm thick; the serous component had LVSI and 3 of 7 pelvic nodes were involved with the serous component.   CT CAP on 02-25-13 had no findings of concern in the chest and no retroperitoneal adenopathy or other findings of concern for metastatic disease. Dr Sondra Come recommended IMRT after 3 cycles of chemotherapy and vaginal vault brachytherapy during the final 3 cycles of chemotherapy. Patient had 3 cycles of q 3 week taxol carbo from 03-15-13 thru 04-26-13, with neulasta day 2 and additional IVF, then external beam RT completed 07-01-13; chemo resumed with cycle 4 taxol carboplatin on 07-08-13, with HDR treatments x3 from 8-11 thru 07-30-13. Cycle 6 was delayed x 1 week due to thrombocytopenia, given on 08-26-13, with neulasta.   She was last seen by Dr. Sondra Come on October 2 and by Dr. Marko Plume October 14. She had a CT scan performed on November 3 that revealed:  There is no focal abnormality in the liver or spleen. Small hiatal hernia is stable. Stomach is decompressed. The duodenum, pancreas,  gallbladder, and left adrenal gland are unremarkable. 13 mm right adrenal gland is stable. Tiny low-density lesion in the lower pole the right kidney Stable. Left kidney is unremarkable. No evidence for hydronephrosis or hydroureter on either side. No abdominal aortic aneurysm. There is no lymphadenopathy in the abdomen. No free abdominal fluid. Imaging through the pelvis shows no free intraperitoneal fluid. No pelvic sidewall lymphadenopathy. The small fluid collection seen along the left external iliac vasculature previously has resolved in the interval.   The terminal ileum and the appendix are normal. The proximal and mid sigmoid segments of the colon are position in the central anatomic pelvis in show some mild inner wall enhancement the lumen in this portion of the colon is relatively fixed in diameter compared to other areas of the colon. No definite evidence for stricture although the rectum is decompressed. Uterus and ovaries are surgically absent. Bone windows reveal no worrisome lytic or sclerotic osseous lesions.   IMPRESSION:  No evidence for lymphadenopathy or metastatic disease. The small fluid collection along the left pelvic sidewall seen previously has resolved in the interval. Fixed diameter of the proximal and mid sigmoid colon with slight hyper enhancement of the wall. This may reflect a degree of radiation change there is no evidence for overt distal colonic stricture at this time although the lumen does appear to be diffusely narrowed.   Stable 13 mm right adrenal nodule. Washout characteristics are compatible with benign adrenal adenoma.  Interval history: She's overall doing fairly well she is starting to feel more like herself. She saw Dr. Sondra Come in January  and had a negative Pap smear at that time. She states that she doesn't do as much as she thinks she should. When asked her to elaborate, she states she gets tired in the afternoon. She does go to bed at 8 or 10:00 at night  wakes up and 6 in the morning. She goes to bed at 8:00 about 2-3 days a week. She states she does not clean her house the way that she did before and she's not been back on her treadmill. She feels tired. Her weight is down 4 pounds since we last saw her. However, it up 13 pounds overall. She does occasionally feel nauseated she's overly tired. She occasionally has some fleeting pelvic discomfort. She denies any change about bladder habits. She is using her vaginal dilators 3 days a week. She's up-to-date on her mammograms having had one in January. There are no new medical problems and her family.  Review of Systems:  Constitutional: Feels tired with no energy during the day. However, this is steadily improving.  Denies fever. Skin: No rash, sores, jaundice, itching, or dryness.  Cardiovascular: No chest pain, shortness of breath, or edema  Pulmonary: No cough or wheeze.  Gastro Intestinal: No nausea, vomiting, constipation, or diarrhea reported. No bright red blood per rectum or change in bowel movement.  Genitourinary: No frequency, urgency, or dysuria.  Denies vaginal bleeding and discharge.  Musculoskeletal: No myalgia, arthralgia, joint swelling or pain.  Neurologic: No weakness, numbness, or change in gait.  Psychology: No changes   Current Meds:  Outpatient Encounter Prescriptions as of 04/17/2014  Medication Sig  . atorvastatin (LIPITOR) 10 MG tablet TAKE 1 TABLET (10 MG TOTAL) BY MOUTH EVERY EVENING.  . calcium carbonate (TUMS EX) 750 MG chewable tablet Chew 2 tablets by mouth 2 (two) times daily.  . Multiple Vitamin (MULTIVITAMIN) tablet Take 1 tablet by mouth daily.   . Omega-3 Fatty Acids (FISH OIL) 1200 MG CAPS Take 1 capsule by mouth daily.     Allergy: No Known Allergies  Social Hx:   History   Social History  . Marital Status: Married    Spouse Name: N/A    Number of Children: N/A  . Years of Education: N/A   Occupational History  . Not on file.   Social History  Main Topics  . Smoking status: Never Smoker   . Smokeless tobacco: Never Used  . Alcohol Use: No  . Drug Use: No  . Sexual Activity: Yes     Comment: menarche age 49, G43,  P71, menopause 2006   Other Topics Concern  . Not on file   Social History Narrative  . No narrative on file    Past Surgical Hx:  Past Surgical History  Procedure Laterality Date  . Rhinoplasty      breathing problems- Dr Ernesto Rutherford  . Robotic assisted total hysterectomy with bilateral salpingo oopherectomy N/A 02/12/2013    Procedure: ROBOTIC ASSISTED TOTAL HYSTERECTOMY WITH BILATERAL SALPINGO OOPHORECTOMY,  LYMPH NODES;  Surgeon: Imagene Gurney A. Alycia Rossetti, MD;  Location: [facility];  Service: Gynecology;  Laterality: N/A;  . Abdominal hysterectomy  02/12/13    Past Medical Hx:  Past Medical History  Diagnosis Date  . Migraine   . Hyperlipidemia     borderline high cholesterol  . Skin lesion     non maligant skin lesions on head  . Cancer 02/12/13    endometrial, positive nodes  . History of radiation therapy 05/27/2013-07/01/2013    pelvis 45 gray in 25  fractions, 63 gray with high dose brachytherapy    Oncology Hx:    Endometrial cancer   01/07/2013 Initial Diagnosis Endometrial cancer   02/12/2013 Surgery IIIC1 endometrioid grade 3   03/15/2013 - 08/26/2013 Chemotherapy Completed 6 cycles of paclitaxel and carboplatin with sandwich radiation therapy (completed 07/01/13). First thee cycles of chemotherapy 03/15/13-04/26/13 then last three cycles of chemotherapy 07/08/13 thru 08/26/13.    Family Hx:  Family History  Problem Relation Age of Onset  . Diabetes Mother   . Kidney disease Mother   . Benign prostatic hyperplasia Father   . Diabetes Maternal Grandmother     Vitals:  Blood pressure 126/76, pulse 69, temperature 98 F (36.7 C), temperature source Oral, resp. rate 20, height 5\' 4"  (1.626 m), weight 184 lb 1.6 oz (83.507 kg).  Physical Exam: Well-nourished well-developed female in no acute distress.  Neck:  Supple, no lymphadenopathy no thyromegaly.  Lungs: Clear to auscultation bilaterally.  Cardiovascular: Regular rate and rhythm.  Abdomen: Well-healed laparoscopic incisions. Abdomen is soft, nontender, nondistended. There is no palpable masses. There is no hepatosplenomegaly.  Groins: No lymphadenopathy.  Extremities no edema.  Pelvic: Normal external female genitalia. Vagina is atrophic. Vaginal cuff is visualized. There are no visible lesions. Bimanual examination reveals no masses or nodularity. Rectal confirms.  Assessment/Plan: 53 year-old with IIIc 1 endometrial carcinoma. Serous histology. She's completed chemotherapy and radiation under "sandwich" fashion. Her last cycle of chemotherapy was in September 2014. Should a post treatment CT scan in November that was unremarkable. She has followup scheduled with Dr. Marko Plume later this year. She will see Dr. Sondra Come in February of 2015 and return to see me in May of 2015. She'll continue using her vaginal dilators. She knows to return to see [place] if she has any bleeding, pain or other symptoms. We will schedule a mammogram for her in November 2015 and call her with the results.  Kimala Horne A. Alycia Rossetti, MD 04/17/2014, 12:49 PM

## 2014-04-17 NOTE — Patient Instructions (Addendum)
Followup with Dr. Sondra Come in 4 months and return to see Dr. Alycia Rossetti in 8 months. We'll schedule a CT scan November of 2015.

## 2014-04-20 IMAGING — CR DG CHEST 2V
2 series · 2 of 2 positions shown · non-contrast
Comparison: None

CLINICAL DATA: Endometrial cancer, preoperative assessment

CHEST - 2 VIEW

[w chest pa]
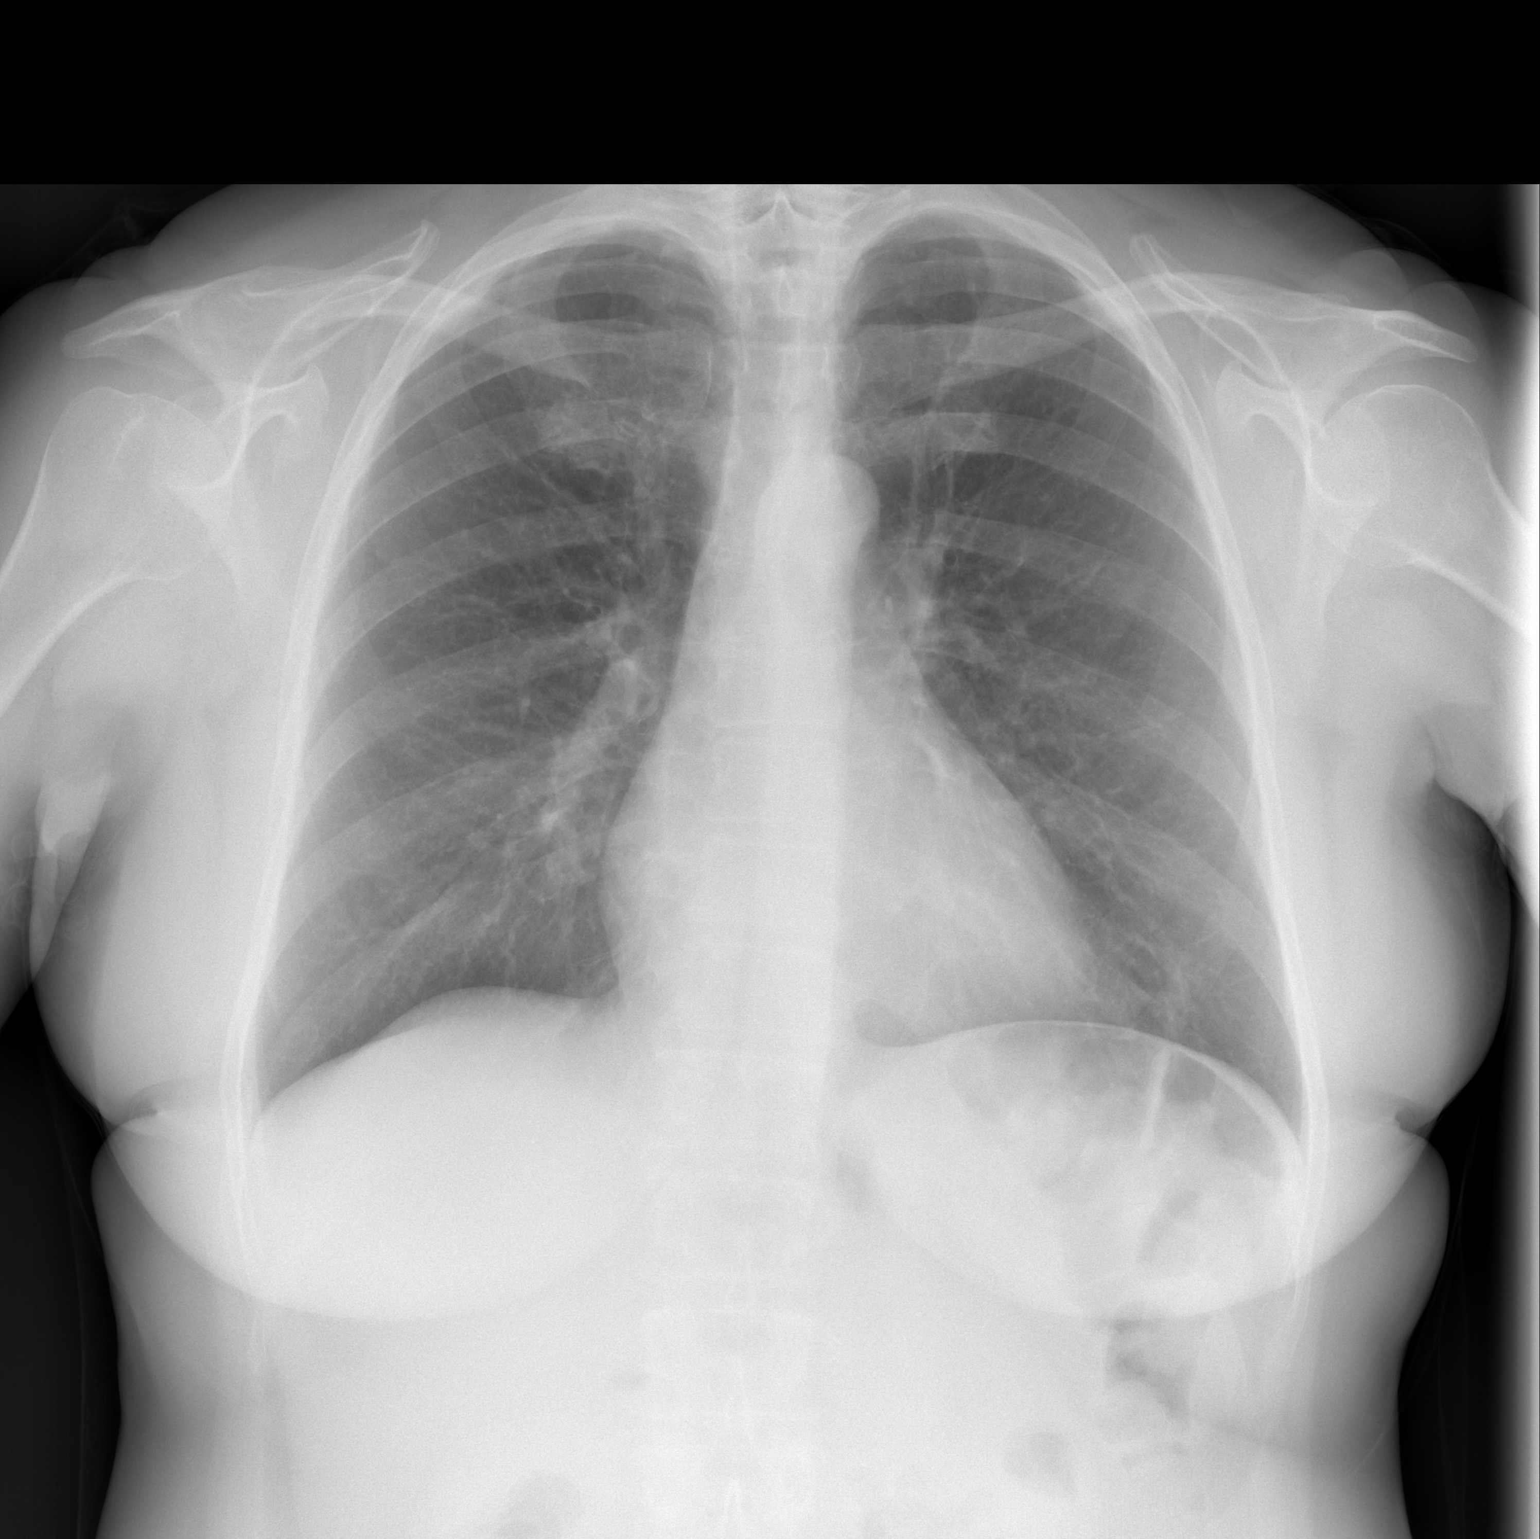

[w chest lat]
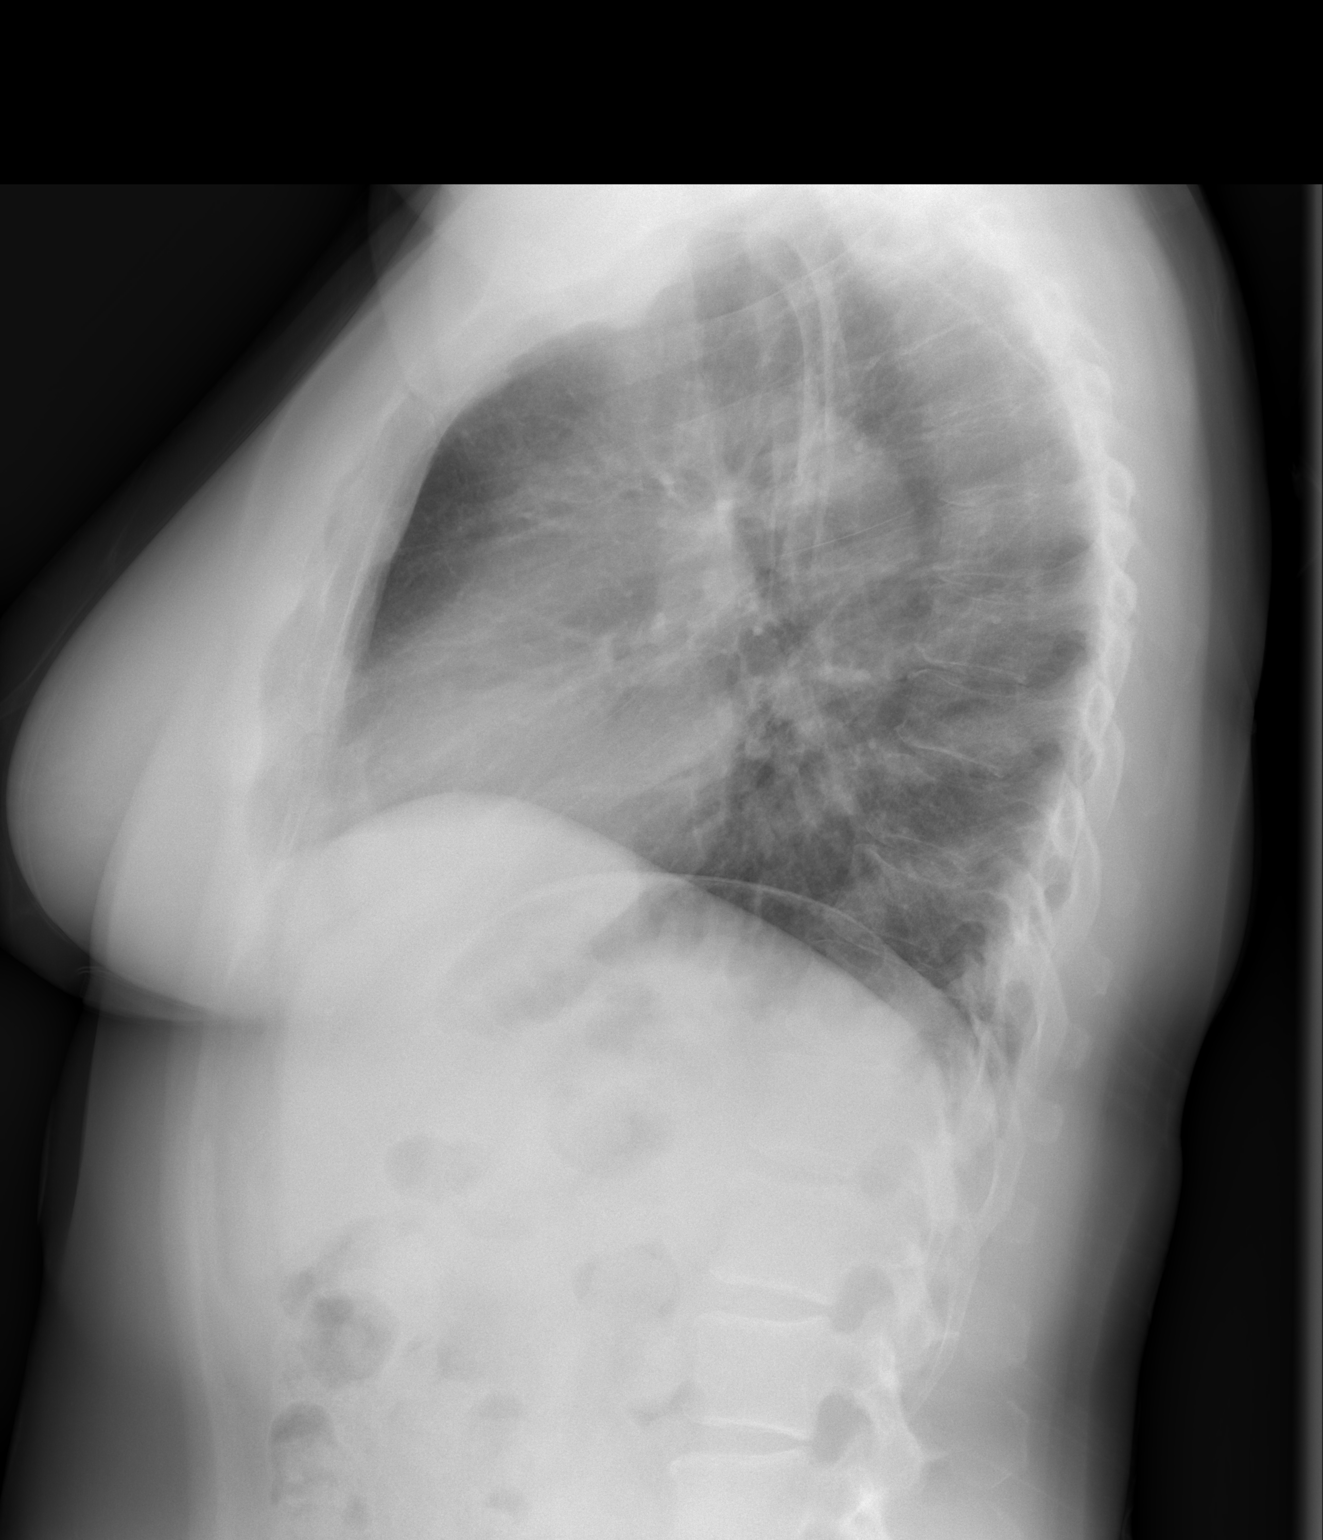

[2 of 2 positions shown; findings below may reference images not displayed]

FINDINGS: Upper-normal size of cardiac silhouette.
Mediastinal contours and pulmonary vascularity normal.
Lungs clear.
No pulmonary infiltrate, pleural effusion or pneumothorax.
Bones unremarkable.
IMPRESSION: No acute abnormalities.

## 2014-08-14 ENCOUNTER — Ambulatory Visit
Admission: RE | Admit: 2014-08-14 | Discharge: 2014-08-14 | Disposition: A | Discharge status: Home / Self Care | Payer: 59 | Source: Ambulatory Visit | Attending: Radiation Oncology | Admitting: Radiation Oncology

## 2014-08-14 ENCOUNTER — Other Ambulatory Visit (HOSPITAL_COMMUNITY)
Admission: RE | Admit: 2014-08-14 | Discharge: 2014-08-14 | Disposition: A | Discharge status: Home / Self Care | Payer: 59 | Source: Ambulatory Visit | Attending: Radiation Oncology | Admitting: Radiation Oncology

## 2014-08-14 ENCOUNTER — Encounter: Payer: Self-pay | Admitting: Radiation Oncology

## 2014-08-14 VITALS — BP 124/80 | HR 92 | Temp 98.3°F | Ht 64.0 in | Wt 186.9 lb

## 2014-08-14 DIAGNOSIS — Z01419 Encounter for gynecological examination (general) (routine) without abnormal findings: Secondary | ICD-10-CM | POA: Diagnosis not present

## 2014-08-14 DIAGNOSIS — C541 Malignant neoplasm of endometrium: Secondary | ICD-10-CM

## 2014-08-14 NOTE — Progress Notes (Signed)
  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  She is doing well and without complaints. She is scheduled for a CT scan in November of this year. Patient denies any pelvic pain vaginal bleeding hematuria or rectal bleeding. She denies any change in caliber of her stool. Patient continues to use her vaginal dilator.                          ALLERGIES:  has No Known Allergies.  Meds: Current Outpatient Prescriptions  Medication Sig Dispense Refill  . atorvastatin (LIPITOR) 10 MG tablet TAKE 1 TABLET (10 MG TOTAL) BY MOUTH EVERY EVENING.  90 tablet  3  . calcium carbonate (TUMS EX) 750 MG chewable tablet Chew 2 tablets by mouth 2 (two) times daily.      . Multiple Vitamin (MULTIVITAMIN) tablet Take 1 tablet by mouth daily.       . Omega-3 Fatty Acids (FISH OIL) 1200 MG CAPS Take 1 capsule by mouth daily.        No current facility-administered medications for this encounter.    Physical Findings: The patient is in no acute distress. Patient is alert and oriented.  height is 5\' 4"  (1.626 m) and weight is 186 lb 14.4 oz (84.777 kg). Her oral temperature is 98.3 F (36.8 C). Her blood pressure is 124/80 and her pulse is 92. Marland Kitchen  No palpable supraclavicular or axillary adenopathy. The lungs are clear to auscultation. The heart has a regular rhythm and rate. The abdomen is soft and nontender with normal bowel sounds. On pelvic examination the external genitalia are unremarkable. A speculum exam is performed. There are no mucosal lesions noted in the vaginal vault. Radiation changes are  noted at the vaginal cuff. A Pap smear was obtained of the proximal vagina. On bimanual and rectovaginal examination there no pelvic masses appreciated.  Lab Findings: Lab Results  Component Value Date   WBC 6.7 12/06/2013   HGB 11.7* 12/06/2013   HCT 35.1* 12/06/2013   MCV 88.7 12/06/2013   PLT 225.0 12/06/2013      Radiographic Findings: No results found.  Impression:  No evidence of recurrence on clinical exam today, Pap smear pending  Plan:  Routine followup in 6 months. In the interim the patient will undergo a CT scan and gynecologic oncology followup  ____________________________________ Blair Promise, MD

## 2014-08-14 NOTE — Progress Notes (Signed)
Jane Ruiz here for follow up after treatment for endometrial cancer.  She denies bladder issues, bowel issues and vaginal/rectal bleeding.  She denies nausea.  She reports a little fatigue.  She is using her vaginal dilator.

## 2014-08-18 LAB — CYTOLOGY - PAP

## 2014-08-21 ENCOUNTER — Telehealth: Payer: Self-pay | Admitting: Oncology

## 2014-08-21 NOTE — Telephone Encounter (Signed)
Called [name] and informed her of the good results of her pap smear per Dr. Sondra Come.  Jane Ruiz verbalized understanding.

## 2014-10-07 ENCOUNTER — Ambulatory Visit (HOSPITAL_COMMUNITY)
Admission: RE | Admit: 2014-10-07 | Discharge: 2014-10-07 | Disposition: A | Discharge status: Home / Self Care | Payer: 59 | Source: Ambulatory Visit | Attending: Diagnostic Radiology | Admitting: Diagnostic Radiology

## 2014-10-07 ENCOUNTER — Encounter (HOSPITAL_COMMUNITY): Payer: Self-pay

## 2014-10-07 DIAGNOSIS — Z923 Personal history of irradiation: Secondary | ICD-10-CM | POA: Insufficient documentation

## 2014-10-07 DIAGNOSIS — C541 Malignant neoplasm of endometrium: Secondary | ICD-10-CM | POA: Diagnosis present

## 2014-10-07 DIAGNOSIS — Z9221 Personal history of antineoplastic chemotherapy: Secondary | ICD-10-CM | POA: Insufficient documentation

## 2014-10-07 MED ORDER — IOHEXOL 300 MG/ML  SOLN
100.0000 mL | Freq: Once | INTRAMUSCULAR | Status: AC | PRN
  Administered 2014-10-07: 100 mL via INTRAVENOUS

## 2014-10-14 ENCOUNTER — Telehealth: Payer: Self-pay | Admitting: Gynecologic Oncology

## 2014-10-14 NOTE — Telephone Encounter (Signed)
Patient called to schedule a follow up appointment and to find out about her CT scan results.  Notified of results.  Advised to call for any questions or concerns.

## 2014-12-22 ENCOUNTER — Telehealth: Payer: Self-pay | Admitting: Family Medicine

## 2014-12-22 DIAGNOSIS — Z0000 Encounter for general adult medical examination without abnormal findings: Secondary | ICD-10-CM

## 2014-12-22 NOTE — Telephone Encounter (Signed)
-----   Message from Ellamae Sia sent at 12/18/2014 12:25 PM EST ----- Regarding: Lab orders for Tuesday, 1.19.16 Patient is scheduled for CPX labs, please order future labs, Thanks , Karna Christmas

## 2014-12-23 ENCOUNTER — Other Ambulatory Visit (INDEPENDENT_AMBULATORY_CARE_PROVIDER_SITE_OTHER): Payer: 59

## 2014-12-23 DIAGNOSIS — Z0000 Encounter for general adult medical examination without abnormal findings: Secondary | ICD-10-CM

## 2014-12-24 LAB — COMPREHENSIVE METABOLIC PANEL
ALK PHOS: 85 U/L (ref 39–117)
ALT: 14 U/L (ref 0–35)
AST: 18 U/L (ref 0–37)
Albumin: 4.2 g/dL (ref 3.5–5.2)
BILIRUBIN TOTAL: 0.4 mg/dL (ref 0.2–1.2)
BUN: 13 mg/dL (ref 6–23)
CO2: 29 mEq/L (ref 19–32)
Calcium: 9.9 mg/dL (ref 8.4–10.5)
Chloride: 102 mEq/L (ref 96–112)
Creatinine, Ser: 0.77 mg/dL (ref 0.40–1.20)
GFR: 82.41 mL/min (ref >60.00)
Glucose, Bld: 90 mg/dL (ref 70–99)
Potassium: 3.8 mEq/L (ref 3.5–5.1)
SODIUM: 138 meq/L (ref 135–145)
Total Protein: 7.2 g/dL (ref 6.0–8.3)

## 2014-12-24 LAB — CBC WITH DIFFERENTIAL/PLATELET
BASOS PCT: 0.1 % (ref 0.0–3.0)
Basophils Absolute: 0 10*3/uL (ref 0.0–0.1)
EOS ABS: 0.1 10*3/uL (ref 0.0–0.7)
EOS PCT: 1.1 % (ref 0.0–5.0)
HEMATOCRIT: 39.6 % (ref 36.0–46.0)
Hemoglobin: 13.5 g/dL (ref 12.0–15.0)
Lymphocytes Relative: 11.1 % — ABNORMAL LOW (ref 12.0–46.0)
Lymphs Abs: 1.2 10*3/uL (ref 0.7–4.0)
MCHC: 34.1 g/dL (ref 30.0–36.0)
MCV: 86.4 fl (ref 78.0–100.0)
MONO ABS: 0.7 10*3/uL (ref 0.1–1.0)
MONOS PCT: 7 % (ref 3.0–12.0)
NEUTROS PCT: 80.7 % — AB (ref 43.0–77.0)
Neutro Abs: 8.4 10*3/uL — ABNORMAL HIGH (ref 1.4–7.7)
Platelets: 257 10*3/uL (ref 150.0–400.0)
RBC: 4.58 Mil/uL (ref 3.87–5.11)
RDW: 14.5 % (ref 11.5–15.5)
WBC: 10.4 10*3/uL (ref 4.0–10.5)

## 2014-12-24 LAB — LIPID PANEL
Cholesterol: 179 mg/dL (ref 0–200)
HDL: 34.5 mg/dL — AB (ref >39.00)
LDL CALC: 111 mg/dL — AB (ref 0–99)
NonHDL: 144.5
TRIGLYCERIDES: 169 mg/dL — AB (ref 0.0–149.0)
Total CHOL/HDL Ratio: 5
VLDL: 33.8 mg/dL (ref 0.0–40.0)

## 2014-12-24 LAB — TSH: TSH: 2.6 u[IU]/mL (ref 0.35–4.50)

## 2014-12-30 ENCOUNTER — Encounter: Payer: Self-pay | Admitting: Family Medicine

## 2014-12-30 ENCOUNTER — Ambulatory Visit (INDEPENDENT_AMBULATORY_CARE_PROVIDER_SITE_OTHER): Payer: 59 | Admitting: Family Medicine

## 2014-12-30 VITALS — BP 124/82 | HR 74 | Temp 97.7°F | Ht 64.0 in | Wt 187.8 lb

## 2014-12-30 DIAGNOSIS — E785 Hyperlipidemia, unspecified: Secondary | ICD-10-CM

## 2014-12-30 DIAGNOSIS — E669 Obesity, unspecified: Secondary | ICD-10-CM

## 2014-12-30 DIAGNOSIS — Z0000 Encounter for general adult medical examination without abnormal findings: Secondary | ICD-10-CM

## 2014-12-30 MED ORDER — ATORVASTATIN CALCIUM 10 MG PO TABS: ORAL_TABLET | ORAL | Status: DC

## 2014-12-30 NOTE — Progress Notes (Signed)
Pre visit review using our clinic review tool, if applicable. No additional management support is needed unless otherwise documented below in the visit note. 

## 2014-12-30 NOTE — Assessment & Plan Note (Signed)
Not quite at goal  Pt does not want to inc lipitor-will work harder on diet and exercise  Disc goals for lipids and reasons to control them Rev labs with pt Rev low sat fat diet in detail

## 2014-12-30 NOTE — Assessment & Plan Note (Signed)
Discussed how this problem influences overall health and the risks it imposes  Reviewed plan for weight loss with lower calorie diet (via better food choices and also portion control or program like weight watchers) and exercise building up to or more than 30 minutes 5 days per week including some aerobic activity   Not active- needs to gradually work on stamina

## 2014-12-30 NOTE — Patient Instructions (Signed)
We will send for last mammogram  Let your GI doctor know when you are ready for colonoscopy  Your cholesterol is not quite at goal   (Avoid red meat/ fried foods/ egg yolks/ fatty breakfast meats/ butter, cheese and high fat dairy/ and shellfish)   Take care of yourself  Increase exercise duration and intensity very slowly - you will feel better when you are back in shape    Fat and Cholesterol Control Diet Fat and cholesterol levels in your blood and organs are influenced by your diet. High levels of fat and cholesterol may lead to diseases of the heart, small and large blood vessels, gallbladder, liver, and pancreas. CONTROLLING FAT AND CHOLESTEROL WITH DIET Although exercise and lifestyle factors are important, your diet is key. That is because certain foods are known to raise cholesterol and others to lower it. The goal is to balance foods for their effect on cholesterol and more importantly, to replace saturated and trans fat with other types of fat, such as monounsaturated fat, polyunsaturated fat, and omega-3 fatty acids. On average, a person should consume no more than 15 to 17 g of saturated fat daily. Saturated and trans fats are considered "bad" fats, and they will raise LDL cholesterol. Saturated fats are primarily found in animal products such as meats, butter, and cream. However, that does not mean you need to give up all your favorite foods. Today, there are good tasting, low-fat, low-cholesterol substitutes for most of the things you like to eat. Choose low-fat or nonfat alternatives. Choose round or loin cuts of red meat. These types of cuts are lowest in fat and cholesterol. Chicken (without the skin), fish, veal, and ground [place] breast are great choices. Eliminate fatty meats, such as hot dogs and salami. Even shellfish have little or no saturated fat. Have a 3 oz (85 g) portion when you eat lean meat, poultry, or fish. Trans fats are also called "partially hydrogenated oils." They  are oils that have been scientifically manipulated so that they are solid at room temperature resulting in a longer shelf life and improved taste and texture of foods in which they are added. Trans fats are found in stick margarine, some tub margarines, cookies, crackers, and baked goods.  When baking and cooking, oils are a great substitute for butter. The monounsaturated oils are especially beneficial since it is believed they lower LDL and raise HDL. The oils you should avoid entirely are saturated tropical oils, such as coconut and palm.  Remember to eat a lot from food groups that are naturally free of saturated and trans fat, including fish, fruit, vegetables, beans, grains (barley, rice, couscous, bulgur wheat), and pasta (without cream sauces).  IDENTIFYING FOODS THAT LOWER FAT AND CHOLESTEROL  Soluble fiber may lower your cholesterol. This type of fiber is found in fruits such as apples, vegetables such as broccoli, potatoes, and carrots, legumes such as beans, peas, and lentils, and grains such as barley. Foods fortified with plant sterols (phytosterol) may also lower cholesterol. You should eat at least 2 g per day of these foods for a cholesterol lowering effect.  Read package labels to identify low-saturated fats, trans fat free, and low-fat foods at the supermarket. Select cheeses that have only 2 to 3 g saturated fat per ounce. Use a heart-healthy tub margarine that is free of trans fats or partially hydrogenated oil. When buying baked goods (cookies, crackers), avoid partially hydrogenated oils. Breads and muffins should be made from whole grains (whole-wheat or whole  oat flour, instead of "flour" or "enriched flour"). Buy non-creamy canned soups with reduced salt and no added fats.  FOOD PREPARATION TECHNIQUES  Never deep-fry. If you must fry, either stir-fry, which uses very little fat, or use non-stick cooking sprays. When possible, broil, bake, or roast meats, and steam vegetables. Instead  of putting butter or margarine on vegetables, use lemon and herbs, applesauce, and cinnamon (for squash and sweet potatoes). Use nonfat yogurt, salsa, and low-fat dressings for salads.  LOW-SATURATED FAT / LOW-FAT FOOD SUBSTITUTES Meats / Saturated Fat (g)  Avoid: Steak, marbled (3 oz/85 g) / 11 g  Choose: Steak, lean (3 oz/85 g) / 4 g  Avoid: Hamburger (3 oz/85 g) / 7 g  Choose: Hamburger, lean (3 oz/85 g) / 5 g  Avoid: Ham (3 oz/85 g) / 6 g  Choose: Ham, lean cut (3 oz/85 g) / 2.4 g  Avoid: Chicken, with skin, dark meat (3 oz/85 g) / 4 g  Choose: Chicken, skin removed, dark meat (3 oz/85 g) / 2 g  Avoid: Chicken, with skin, light meat (3 oz/85 g) / 2.5 g  Choose: Chicken, skin removed, light meat (3 oz/85 g) / 1 g Dairy / Saturated Fat (g)  Avoid: Whole milk (1 cup) / 5 g  Choose: Low-fat milk, 2% (1 cup) / 3 g  Choose: Low-fat milk, 1% (1 cup) / 1.5 g  Choose: Skim milk (1 cup) / 0.3 g  Avoid: Hard cheese (1 oz/28 g) / 6 g  Choose: Skim milk cheese (1 oz/28 g) / 2 to 3 g  Avoid: Cottage cheese, 4% fat (1 cup) / 6.5 g  Choose: Low-fat cottage cheese, 1% fat (1 cup) / 1.5 g  Avoid: Ice cream (1 cup) / 9 g  Choose: Sherbet (1 cup) / 2.5 g  Choose: Nonfat frozen yogurt (1 cup) / 0.3 g  Choose: Frozen fruit bar / trace  Avoid: Whipped cream (1 tbs) / 3.5 g  Choose: Nondairy whipped topping (1 tbs) / 1 g Condiments / Saturated Fat (g)  Avoid: Mayonnaise (1 tbs) / 2 g  Choose: Low-fat mayonnaise (1 tbs) / 1 g  Avoid: Butter (1 tbs) / 7 g  Choose: Extra light margarine (1 tbs) / 1 g  Avoid: Coconut oil (1 tbs) / 11.8 g  Choose: Olive oil (1 tbs) / 1.8 g  Choose: Corn oil (1 tbs) / 1.7 g  Choose: Safflower oil (1 tbs) / 1.2 g  Choose: Sunflower oil (1 tbs) / 1.4 g  Choose: Soybean oil (1 tbs) / 2.4 g  Choose: Canola oil (1 tbs) / 1 g Document Released: 11/21/2005 Document Revised: 03/18/2013 Document Reviewed: 02/19/2014 [employer] Patient  Information 2015 [place], [place]. This information is not intended to replace advice given to you by your health care provider. Make sure you discuss any questions you have with your health care provider.

## 2014-12-30 NOTE — Progress Notes (Signed)
Subjective:    Patient ID: Jane Ruiz, female    DOB: 12-30-1960, 54 y.o.   MRN: [id number]  HPI Here for health maintenance exam and to review chronic medical problems    Has been doing well overall    Wt is up 1 lb with bmi of 32 Obese Having a hard time loosing weight - not much of an effort due to easy fatigability (ever since surgery)  She likes to walk on her treadmill - did some last week (15 minutes)  She eats well overall - does not eat junk , eating vegetables  May need to dec portions    HIV screen- declines   Mammogram per pt 1/15 ? Unsure- cannot remember - her gyn ordered - ? [facility]  Self exam- no breast lumps or changes  Had her breast exam at gyn visit in the fall   Hx of endometrial ca with surg and radiation CT scan in Nov clear -was the 3rd one - she is thrilled   Flu shot 10/15 Td 12/09  Pap 9/15 nl , had a breast exam then again  Close f/u with gyn and also oncol   colonosc 7/10 with hyperplastic polyp  Nl IFOB card from GI in 2/15 GI wants her to have another colonosc- she is not ready yet    Hyperlipidemia Lab Results  Component Value Date   CHOL 179 12/23/2014   CHOL 165 12/06/2013   CHOL 152 11/23/2012   Lab Results  Component Value Date   HDL 34.50* 12/23/2014   HDL 31.70* 12/06/2013   HDL 34.30* 11/23/2012   Lab Results  Component Value Date   LDLCALC 111* 12/23/2014   LDLCALC 99 12/06/2013   LDLCALC 93 11/23/2012   Lab Results  Component Value Date   TRIG 169.0* 12/23/2014   TRIG 172.0* 12/06/2013   TRIG 125.0 11/23/2012   Lab Results  Component Value Date   CHOLHDL 5 12/23/2014   CHOLHDL 5 12/06/2013   CHOLHDL 4 11/23/2012   Lab Results  Component Value Date   LDLDIRECT 172.4 12/24/2010   LDLDIRECT 174.9 09/17/2010   LDLDIRECT 164.2 07/06/2010   overall pretty stable Needs to exercise more  She declines inc in her statin - will work on diet harder  She occ splurges on ice cream , occ french fries  No red  meat  occ fast food at lunch     Chemistry      Component Value Date/Time   NA 138 12/23/2014 1631   NA 142 11/25/2013 0754   K 3.8 12/23/2014 1631   K 3.4* 11/25/2013 0754   CL 102 12/23/2014 1631   CL 105 05/15/2013 1249   CO2 29 12/23/2014 1631   CO2 27 11/25/2013 0754   BUN 13 12/23/2014 1631   BUN 9.8 11/25/2013 0754   CREATININE 0.77 12/23/2014 1631   CREATININE 0.8 11/25/2013 0754      Component Value Date/Time   CALCIUM 9.9 12/23/2014 1631   CALCIUM 9.5 11/25/2013 0754   ALKPHOS 85 12/23/2014 1631   ALKPHOS 81 11/25/2013 0754   AST 18 12/23/2014 1631   AST 15 11/25/2013 0754   ALT 14 12/23/2014 1631   ALT 15 11/25/2013 0754   BILITOT 0.4 12/23/2014 1631   BILITOT 0.28 11/25/2013 0754      Lab Results  Component Value Date   WBC 10.4 12/23/2014   HGB 13.5 12/23/2014   HCT 39.6 12/23/2014   MCV 86.4 12/23/2014   PLT 257.0 12/23/2014  Lab Results  Component Value Date   TSH 2.60 12/23/2014     Patient Active Problem List   Diagnosis Date Noted  . Cancer   . Endometrial cancer 02/12/2013  . Preoperative exam for gynecologic surgery 01/21/2013  . Excessive menses 12/25/2012  . Other screening mammogram 11/04/2011  . Obesity 11/04/2011  . Routine general medical examination at a health care facility 10/20/2011  . PLANTAR FASCIITIS 12/31/2010  . HYPERLIPIDEMIA 09/24/2010  . MENOPAUSAL SYNDROME 07/06/2010   Past Medical History  Diagnosis Date  . Migraine   . Hyperlipidemia     borderline high cholesterol  . Skin lesion     non maligant skin lesions on head  . History of radiation therapy 05/27/2013-07/01/2013    pelvis 45 gray in 25 fractions, 63 gray with high dose brachytherapy  . endometrial ca 02/12/13    endometrial, positive nodes   Past Surgical History  Procedure Laterality Date  . Rhinoplasty      breathing problems- Dr Ernesto Rutherford  . Robotic assisted total hysterectomy with bilateral salpingo oopherectomy N/A 02/12/2013    Procedure:  ROBOTIC ASSISTED TOTAL HYSTERECTOMY WITH BILATERAL SALPINGO OOPHORECTOMY,  LYMPH NODES;  Surgeon: Imagene Gurney A. Alycia Rossetti, MD;  Location: [facility];  Service: Gynecology;  Laterality: N/A;  . Abdominal hysterectomy  02/12/13   History  Substance Use Topics  . Smoking status: Never Smoker   . Smokeless tobacco: Never Used  . Alcohol Use: No   Family History  Problem Relation Age of Onset  . Diabetes Mother   . Kidney disease Mother   . Benign prostatic hyperplasia Father   . Diabetes Maternal Grandmother    No Known Allergies Current Outpatient Prescriptions on File Prior to Visit  Medication Sig Dispense Refill  . atorvastatin (LIPITOR) 10 MG tablet TAKE 1 TABLET (10 MG TOTAL) BY MOUTH EVERY EVENING. 90 tablet 3  . calcium carbonate (TUMS EX) 750 MG chewable tablet Chew 2 tablets by mouth 2 (two) times daily.    . Multiple Vitamin (MULTIVITAMIN) tablet Take 1 tablet by mouth daily.     . Omega-3 Fatty Acids (FISH OIL) 1200 MG CAPS Take 1 capsule by mouth daily.      No current facility-administered medications on file prior to visit.     Review of Systems Review of Systems  Constitutional: Negative for fever, appetite change, fatigue and unexpected weight change.  Eyes: Negative for pain and visual disturbance.  Respiratory: Negative for cough and shortness of breath.   Cardiovascular: Negative for cp or palpitations    Gastrointestinal: Negative for nausea, diarrhea and constipation.  Genitourinary: Negative for urgency and frequency.  Skin: Negative for pallor or rash   Neurological: Negative for weakness, light-headedness, numbness and headaches.  Hematological: Negative for adenopathy. Does not bruise/bleed easily.  Psychiatric/Behavioral: Negative for dysphoric mood. The patient is  nervous/anxious.         Objective:   Physical Exam  Constitutional: She appears well-developed and well-nourished. No distress.  obese and well appearing   HENT:  Head: Normocephalic and  atraumatic.  Right Ear: External ear normal.  Left Ear: External ear normal.  Nose: Nose normal.  Mouth/Throat: Oropharynx is clear and moist.  Eyes: Conjunctivae and EOM are normal. Pupils are equal, round, and reactive to light. Right eye exhibits no discharge. Left eye exhibits no discharge. No scleral icterus.  Neck: Normal range of motion. Neck supple. No JVD present. No thyromegaly present.  Cardiovascular: Normal rate, regular rhythm, normal heart sounds and intact distal pulses.  Exam reveals no gallop.   Pulmonary/Chest: Effort normal and breath sounds normal. No respiratory distress. She has no wheezes. She has no rales.  Abdominal: Soft. Bowel sounds are normal. She exhibits no distension and no mass. There is no tenderness.  Musculoskeletal: She exhibits no edema or tenderness.  Lymphadenopathy:    She has no cervical adenopathy.  Neurological: She is alert. She has normal reflexes. No cranial nerve deficit. She exhibits normal muscle tone. Coordination normal.  Skin: Skin is warm and dry. No rash noted. No erythema. No pallor.  Psychiatric: Her mood appears anxious.          Assessment & Plan:   Problem List Items Addressed This Visit      Other   Hyperlipidemia    Not quite at goal  Pt does not want to inc lipitor-will work harder on diet and exercise  Disc goals for lipids and reasons to control them Rev labs with pt Rev low sat fat diet in detail        Relevant Medications   atorvastatin (LIPITOR) tablet   Obesity    Discussed how this problem influences overall health and the risks it imposes  Reviewed plan for weight loss with lower calorie diet (via better food choices and also portion control or program like weight watchers) and exercise building up to or more than 30 minutes 5 days per week including some aerobic activity   Not active- needs to gradually work on stamina      Routine general medical examination at a health care facility - Primary     Reviewed health habits including diet and exercise and skin cancer prevention Reviewed appropriate screening tests for age  Also reviewed health mt list, fam hx and immunization status , as well as social and family history   See HPI Sees gyn s/p endomet cancer (doing well)  GI wants her to have a colonoscopy due to above (last in 2010)- she will call when ready to schedule   Sent for her last mammogram report   Lab rev

## 2014-12-30 NOTE — Assessment & Plan Note (Signed)
Reviewed health habits including diet and exercise and skin cancer prevention Reviewed appropriate screening tests for age  Also reviewed health mt list, fam hx and immunization status , as well as social and family history   See HPI Sees gyn s/p endomet cancer (doing well)  GI wants her to have a colonoscopy due to above (last in 2010)- she will call when ready to schedule   Sent for her last mammogram report   Lab rev

## 2015-01-01 ENCOUNTER — Telehealth: Payer: Self-pay | Admitting: *Deleted

## 2015-01-01 NOTE — Telephone Encounter (Signed)
Left a voicemail stating patient is due for a mammogram and for her to schedule one. We received faxed from [facility] mammography that the last one was done in 11/21/2013. Per instructed by Dr. Marliss Coots called and inform patient.

## 2015-01-01 NOTE — Telephone Encounter (Signed)
Spoken to patient. Inform patient of mammogram is due and to schedule. Patient acknowledge that she understood.

## 2015-01-14 ENCOUNTER — Encounter: Payer: Self-pay | Admitting: Family Medicine

## 2015-02-05 ENCOUNTER — Encounter: Payer: Self-pay | Admitting: Radiation Oncology

## 2015-02-05 ENCOUNTER — Ambulatory Visit
Admission: RE | Admit: 2015-02-05 | Discharge: 2015-02-05 | Disposition: A | Discharge status: Home / Self Care | Payer: 59 | Source: Ambulatory Visit | Attending: Radiation Oncology | Admitting: Radiation Oncology

## 2015-02-05 ENCOUNTER — Other Ambulatory Visit (HOSPITAL_COMMUNITY)
Admission: RE | Admit: 2015-02-05 | Discharge: 2015-02-05 | Disposition: A | Discharge status: Home / Self Care | Payer: 59 | Source: Ambulatory Visit | Attending: Radiation Oncology | Admitting: Radiation Oncology

## 2015-02-05 VITALS — BP 132/69 | HR 83 | Temp 98.3°F | Resp 16 | Ht 64.0 in | Wt 189.1 lb

## 2015-02-05 DIAGNOSIS — Z01411 Encounter for gynecological examination (general) (routine) with abnormal findings: Secondary | ICD-10-CM | POA: Diagnosis present

## 2015-02-05 DIAGNOSIS — C541 Malignant neoplasm of endometrium: Secondary | ICD-10-CM

## 2015-02-05 NOTE — Progress Notes (Signed)
Jane Ruiz here for follow up after treatment for endometrial cancer.  She denies pain, bladder issues, bowel issues, vaginal bleeding and nausea.  She reports her fatigue is getting better.  She reports using her vaginal dilator.  BP 132/69 mmHg  Pulse 83  Temp(Src) 98.3 F (36.8 C) (Oral)  Resp 16  Ht 5\' 4"  (1.626 m)  Wt 189 lb 1.6 oz (85.775 kg)  BMI 32.44 kg/m2

## 2015-02-05 NOTE — Progress Notes (Signed)
  Radiation Oncology         [phone] ________________________________  Name: DEBIE ASHLINE MRN: [id number]  Date: 02/05/2015  DOB: 09-Jun-1961  Follow-Up Visit Note  CC: Loura Pardon, MD  Gordy Levan, MD    ICD-9-CM ICD-10-CM   1. 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  She is doing well and without complaints. Patient denies any vaginal bleeding hematuria or rectal bleeding pelvic pain or low back pain. She continues to use her vaginal dilator as recommended.                              ALLERGIES:  has No Known Allergies.  Meds: Current Outpatient Prescriptions  Medication Sig Dispense Refill  . atorvastatin (LIPITOR) 10 MG tablet TAKE 1 TABLET (10 MG TOTAL) BY MOUTH EVERY EVENING. 90 tablet 3  . calcium carbonate (TUMS EX) 750 MG chewable tablet Chew 2 tablets by mouth 2 (two) times daily.    . Multiple Vitamin (MULTIVITAMIN) tablet Take 1 tablet by mouth daily.     . Omega-3 Fatty Acids (FISH OIL) 1200 MG CAPS Take 1 capsule by mouth daily.      No current facility-administered medications for this encounter.    Physical Findings: The patient is in no acute distress. Patient is alert and oriented.  height is 5\' 4"  (1.626 m) and weight is 189 lb 1.6 oz (85.775 kg). Her oral temperature is 98.3 F (36.8 C). Her blood pressure is 132/69 and her pulse is 83. Her respiration is 16. Marland Kitchen  No palpable subclavicular or axillary adenopathy. The lungs are clear to auscultation. The heart has a regular rhythm and rate. The abdomen is soft and nontender with normal bowel sounds. No inguinal adenopathy is appreciated. On pelvic examination the external genitalia are unremarkable. A speculum exam is performed. There is some radiation changes noted along the anterior proximal vaginal vault. No mucosal lesions  noted. A Pap smear was obtained of the proximal vagina. On bimanual and rectovaginal examination there no pelvic masses appreciated.  Lab Findings: Lab Results  Component Value Date   WBC 10.4 12/23/2014   HGB 13.5 12/23/2014   HCT 39.6 12/23/2014   MCV 86.4 12/23/2014   PLT 257.0 12/23/2014    Radiographic Findings: No results found.  Impression:  No evidence for recurrence on clinical exam today, Pap smear pending  Plan:  Follow-up in September 2016. The patient will follow-up with gynecologic oncology in June  ____________________________________ Blair Promise, MD

## 2015-02-06 LAB — CYTOLOGY - PAP

## 2015-02-11 ENCOUNTER — Telehealth: Payer: Self-pay | Admitting: Oncology

## 2015-02-11 NOTE — Telephone Encounter (Signed)
Called Jane Ruiz and informed her of the good results on her pap smear per Dr. Sondra Come.  [name] verbalized understanding.

## 2015-03-11 ENCOUNTER — Other Ambulatory Visit: Payer: Self-pay | Admitting: Family Medicine

## 2015-04-23 ENCOUNTER — Ambulatory Visit: Payer: 59 | Admitting: Gynecologic Oncology

## 2015-05-05 ENCOUNTER — Ambulatory Visit: Payer: 59 | Attending: Gynecologic Oncology | Admitting: Gynecologic Oncology

## 2015-05-05 ENCOUNTER — Other Ambulatory Visit (HOSPITAL_COMMUNITY)
Admission: RE | Admit: 2015-05-05 | Discharge: 2015-05-05 | Disposition: A | Discharge status: Home / Self Care | Payer: 59 | Source: Ambulatory Visit | Attending: Gynecologic Oncology | Admitting: Gynecologic Oncology

## 2015-05-05 ENCOUNTER — Encounter: Payer: Self-pay | Admitting: Gynecologic Oncology

## 2015-05-05 DIAGNOSIS — Z08 Encounter for follow-up examination after completed treatment for malignant neoplasm: Secondary | ICD-10-CM | POA: Diagnosis not present

## 2015-05-05 DIAGNOSIS — Z01411 Encounter for gynecological examination (general) (routine) with abnormal findings: Secondary | ICD-10-CM | POA: Diagnosis not present

## 2015-05-05 DIAGNOSIS — Z923 Personal history of irradiation: Secondary | ICD-10-CM | POA: Insufficient documentation

## 2015-05-05 DIAGNOSIS — Z90722 Acquired absence of ovaries, bilateral: Secondary | ICD-10-CM | POA: Insufficient documentation

## 2015-05-05 DIAGNOSIS — Z8542 Personal history of malignant neoplasm of other parts of uterus: Secondary | ICD-10-CM | POA: Diagnosis not present

## 2015-05-05 DIAGNOSIS — Z90710 Acquired absence of both cervix and uterus: Secondary | ICD-10-CM | POA: Insufficient documentation

## 2015-05-05 DIAGNOSIS — Z9221 Personal history of antineoplastic chemotherapy: Secondary | ICD-10-CM

## 2015-05-05 DIAGNOSIS — C541 Malignant neoplasm of endometrium: Secondary | ICD-10-CM

## 2015-05-05 NOTE — Progress Notes (Signed)
Consult Note: Gyn-Onc  Jane Ruiz 54 y.o. female  CC:  Chief Complaint  Patient presents with  . Endometrial Cancer    Follow up    HPI:54 year old post-menopausal female who experienced vaginal bleeding beginning 11-28-12. She was seen in January by Dr Glori Bickers and sent to Dr Raliegh Ip.Fogleman. She had endometrial biopsy on 01-06-13 which showed endometroid adenocarcinoma with mucinous features; she had continuous spotting after that biopsy. She saw Dr Skeet Latch on 02-21-13 and was taken to total robotic hysterectomy/BSO/bilateral pelvic nodes, with frozen section showing grade 1 tumor with minimal myometrial involvement, such that paraaortic nodes were not evaluated. She was hospitalized just 1.5 days and required no pain medication after DC home. Final pathology ([id number]), however, showed invasive with a well differentiated component and a high grade serous component. total size was 4.2 cm and invasion 0.3 cm where myometrium 1.8 cm thick; the serous component had LVSI and 3 of 7 pelvic nodes were involved with the serous component.   CT CAP on 02-25-13 had no findings of concern in the chest and no retroperitoneal adenopathy or other findings of concern for metastatic disease. Dr Sondra Come recommended IMRT after 3 cycles of chemotherapy and vaginal vault brachytherapy during the final 3 cycles of chemotherapy. Patient had 3 cycles of q 3 week taxol carbo from 03-15-13 thru 04-26-13, with neulasta day 2 and additional IVF, then external beam RT completed 07-01-13; chemo resumed with cycle 4 taxol carboplatin on 07-08-13, with HDR treatments x3 from 8-11 thru 07-30-13. Cycle 6 was delayed x 1 week due to thrombocytopenia, given on 08-26-13, with neulasta.   She had a CT scan performed on October 07, 2014 that revealed:  There is no focal abnormality in the liver or spleen. Small hiatal hernia is stable. Stomach is decompressed. The duodenum, pancreas, gallbladder, and left adrenal gland are unremarkable. 13 mm right  adrenal gland is stable. Tiny low-density lesion in the lower pole the right kidney Stable. Left kidney is unremarkable. No evidence for hydronephrosis or hydroureter on either side. No abdominal aortic aneurysm. There is no lymphadenopathy in the abdomen. No free abdominal fluid. Imaging through the pelvis shows no free intraperitoneal fluid. No pelvic sidewall lymphadenopathy. The small fluid collection seen along the left external iliac vasculature previously has resolved in the interval.   The terminal ileum and the appendix are normal. The proximal and mid sigmoid segments of the colon are position in the central anatomic pelvis in show some mild inner wall enhancement the lumen in this portion of the colon is relatively fixed in diameter compared to other areas of the colon. No definite evidence for stricture although the rectum is decompressed. Uterus and ovaries are surgically absent. Bone windows reveal no worrisome lytic or sclerotic osseous lesions.   IMPRESSION:  No evidence for lymphadenopathy or metastatic disease. The small fluid collection along the left pelvic sidewall seen previously has resolved in the interval. Fixed diameter of the proximal and mid sigmoid colon with slight hyper enhancement of the wall. This may reflect a degree of radiation change there is no evidence for overt distal colonic stricture at this time although the lumen does appear to be diffusely narrowed.   Stable 13 mm right adrenal nodule. Washout characteristics are compatible with benign adrenal adenoma.  Interval history: She last saw Dr. Sondra Come in March 2016. She comes in today for follow-up. She's overall doing very well. She has a new grandson who is 30 months old. His name is Remus Blake. She is  a 107-year-old granddaughter Lela. She watches both of the children on Fridays. She really offers no complaints whatsoever. She does continue to use her vaginal dilator on a regular basis. She is up-to-date on her mammograms.  There are no new medical problems and her family. Review of systems is as below.  Review of Systems:  Constitutional: Denies fever. Skin: No rash, sores, jaundice, itching, or dryness.  Cardiovascular: No chest pain, shortness of breath, or edema  Pulmonary: No cough or wheeze.  Gastro Intestinal: No nausea, vomiting, constipation, or diarrhea reported. No bright red blood per rectum or change in bowel movement.  Genitourinary: No frequency, urgency, or dysuria.  Denies vaginal bleeding and discharge.  Musculoskeletal: No myalgia, arthralgia, joint swelling or pain.  Neurologic: No weakness, numbness, or change in gait.  Psychology: No changes   Current Meds:  Outpatient Encounter Prescriptions as of 05/05/2015  Medication Sig  . atorvastatin (LIPITOR) 10 MG tablet TAKE 1 TABLET (10 MG TOTAL) BY MOUTH EVERY EVENING.  Marland Kitchen atorvastatin (LIPITOR) 10 MG tablet TAKE 1 TABLET BY MOUTH EVERY EVENING.  . calcium carbonate (TUMS EX) 750 MG chewable tablet Chew 2 tablets by mouth 2 (two) times daily.  . Multiple Vitamin (MULTIVITAMIN) tablet Take 1 tablet by mouth daily.   . Omega-3 Fatty Acids (FISH OIL) 1200 MG CAPS Take 1 capsule by mouth daily.    No facility-administered encounter medications on file as of 05/05/2015.    Allergy: No Known Allergies  Social Hx:   History   Social History  . Marital Status: Married    Spouse Name: N/A  . Number of Children: N/A  . Years of Education: N/A   Occupational History  . Not on file.   Social History Main Topics  . Smoking status: Never Smoker   . Smokeless tobacco: Never Used  . Alcohol Use: No  . Drug Use: No  . Sexual Activity: Yes     Comment: menarche age 53, G65,  P78, menopause 2006   Other Topics Concern  . Not on file   Social History Narrative    Past Surgical Hx:  Past Surgical History  Procedure Laterality Date  . Rhinoplasty      breathing problems- Dr Ernesto Rutherford  . Robotic assisted total hysterectomy with bilateral  salpingo oopherectomy N/A 02/12/2013    Procedure: ROBOTIC ASSISTED TOTAL HYSTERECTOMY WITH BILATERAL SALPINGO OOPHORECTOMY,  LYMPH NODES;  Surgeon: Imagene Gurney A. Alycia Rossetti, MD;  Location: [facility];  Service: Gynecology;  Laterality: N/A;  . Abdominal hysterectomy  02/12/13    Past Medical Hx:  Past Medical History  Diagnosis Date  . Migraine   . Hyperlipidemia     borderline high cholesterol  . Skin lesion     non maligant skin lesions on head  . History of radiation therapy 05/27/2013-07/01/2013    pelvis 45 gray in 25 fractions, 63 gray with high dose brachytherapy  . endometrial ca 02/12/13    endometrial, positive nodes    Oncology Hx:    Endometrial cancer   01/07/2013 Initial Diagnosis Endometrial cancer   02/12/2013 Surgery IIIC1 endometrioid grade 3   03/15/2013 - 08/26/2013 Chemotherapy Completed 6 cycles of paclitaxel and carboplatin with sandwich radiation therapy (completed 07/01/13). First thee cycles of chemotherapy 03/15/13-04/26/13 then last three cycles of chemotherapy 07/08/13 thru 08/26/13.    Family Hx:  Family History  Problem Relation Age of Onset  . Diabetes Mother   . Kidney disease Mother   . Benign prostatic hyperplasia Father   . Diabetes  Maternal Grandmother     Vitals:  There were no vitals taken for this visit.  Physical Exam: Well-nourished well-developed female in no acute distress. Brighter affect today than at other visits.  Neck: Supple, no lymphadenopathy no thyromegaly.  Lungs: Clear to auscultation bilaterally.  Cardiovascular: Regular rate and rhythm.  Abdomen: Well-healed laparoscopic incisions. Abdomen is soft, nontender, nondistended. There are no palpable masses. There is no hepatosplenomegaly.  Groins: No lymphadenopathy.  Extremities no edema.  Pelvic: Normal external female genitalia. Vagina is atrophic. Vaginal cuff is visualized. There are no visible lesions. Pap smear submitted without difficulty. Bimanual examination reveals no masses or  nodularity. Rectal confirms.  Assessment/Plan: 54 year-old with III C1 endometrial carcinoma. Serous histology. She's completed chemotherapy and radiation under "sandwich" fashion. Her last cycle of chemotherapy was in September 2014. Should a post treatment CT scan in November that was unremarkable.  Her exam today is negative. We will follow-up in results for Pap smear from today. She'll follow-up with Dr. Sondra Come as scheduled in September. She'll return to see me in November with a CT scan the week prior. Rudra Hobbins A., MD 05/05/2015, 11:34 AM

## 2015-05-05 NOTE — Patient Instructions (Addendum)
Follow-up with Dr. Sondra Come to as scheduled. We will call you with your pap smear results from today.  Please return to see Dr. Alycia Rossetti on November 30. We'll perform a CT scan the week of November 21.

## 2015-05-07 ENCOUNTER — Ambulatory Visit: Payer: 59 | Admitting: Gynecologic Oncology

## 2015-05-07 LAB — CYTOLOGY - PAP

## 2015-05-08 ENCOUNTER — Telehealth: Payer: Self-pay | Admitting: *Deleted

## 2015-05-08 NOTE — Telephone Encounter (Signed)
-----   Message from Melissa D Cross, NP sent at 05/07/2015  3:00 PM EDT ----- Normal pap smear  ----- Message -----    From: Lab in [facility]    Sent: 05/07/2015   1:44 PM      To: Melissa D Cross, NP    

## 2015-05-08 NOTE — Telephone Encounter (Signed)
Patient notified of results as noted below by Joylene John, NP. No other issues or concerns noted at this time.

## 2015-05-12 ENCOUNTER — Other Ambulatory Visit: Payer: Self-pay | Admitting: *Deleted

## 2015-05-12 ENCOUNTER — Telehealth: Payer: Self-pay | Admitting: *Deleted

## 2015-05-12 DIAGNOSIS — C541 Malignant neoplasm of endometrium: Secondary | ICD-10-CM

## 2015-05-12 NOTE — Telephone Encounter (Signed)
Called patient to schedule lab appt prior to CT scan on 10/28/15. Patient agreeable to come to [facility] on 10/19/15 for 8:15am lab appt. Told patient to please call our office back if she needs to reschedule this appt.

## 2015-05-12 NOTE — Telephone Encounter (Signed)
Per radiology scheduling, patient does not need labs prior to CT scan. Lab appt cancelled and patient notified of this.

## 2015-05-21 ENCOUNTER — Ambulatory Visit: Payer: 59 | Admitting: Gynecologic Oncology

## 2015-08-06 ENCOUNTER — Ambulatory Visit
Admission: RE | Admit: 2015-08-06 | Discharge: 2015-08-06 | Disposition: A | Discharge status: Home / Self Care | Payer: 59 | Source: Ambulatory Visit | Attending: Radiation Oncology | Admitting: Radiation Oncology

## 2015-08-06 ENCOUNTER — Encounter: Payer: Self-pay | Admitting: Radiation Oncology

## 2015-08-06 ENCOUNTER — Other Ambulatory Visit (HOSPITAL_COMMUNITY)
Admission: RE | Admit: 2015-08-06 | Discharge: 2015-08-06 | Disposition: A | Discharge status: Home / Self Care | Payer: 59 | Source: Ambulatory Visit | Attending: Radiation Oncology | Admitting: Radiation Oncology

## 2015-08-06 VITALS — BP 129/75 | HR 92 | Temp 98.6°F | Resp 16 | Ht 64.0 in | Wt 185.9 lb

## 2015-08-06 DIAGNOSIS — Z01411 Encounter for gynecological examination (general) (routine) with abnormal findings: Secondary | ICD-10-CM | POA: Insufficient documentation

## 2015-08-06 DIAGNOSIS — C541 Malignant neoplasm of endometrium: Secondary | ICD-10-CM

## 2015-08-06 NOTE — Progress Notes (Signed)
Radiation Oncology         [phone] ________________________________  Name: Jane Ruiz MRN: [id number]  Date: 08/06/2015  DOB: 02-Aug-1961  Follow-Up Visit Note  CC: Loura Pardon, MD  Gordy Levan, MD  No diagnosis found.  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 She denies current symptoms of pain, nausea, bladder issues and vaginal/rectal bleeding. She denies symptoms of diarrhea or constipation. "I'm more regular than I was before," she stated in regards to her bowel movements. The patient reports her energy level is improving! She has lost 4 lbs since March of 2016 and is trying to loose weight. She is using her provided vaginal dilator as reccommended in regards to the management of her recovery, with no complaint. She reports that she does not experience pain or bleeding when using her vaginal dilator. The patient projected a healthy mental status and was not accompanied by family for today's radiation oncology appointment.                    ALLERGIES:  has No Known Allergies.  Meds: Current Outpatient Prescriptions  Medication Sig Dispense Refill  . atorvastatin (LIPITOR) 10 MG tablet TAKE 1 TABLET BY MOUTH EVERY EVENING. 90 tablet 3  . calcium carbonate (TUMS EX) 750 MG chewable tablet Chew 2 tablets by mouth 2 (two) times daily.    . Multiple Vitamin (MULTIVITAMIN) tablet Take 1 tablet by mouth daily.     . Omega-3 Fatty Acids (FISH OIL) 1200 MG CAPS Take 1 capsule by mouth daily.     Marland Kitchen atorvastatin (LIPITOR) 10 MG tablet TAKE 1 TABLET (10 MG TOTAL) BY MOUTH EVERY EVENING. 90 tablet 3   No current facility-administered medications for this encounter.    Physical Findings: Patient is alert and oriented x3 and is in no acute distress.  height is 5\' 4"  (1.626 m) and weight is 185 lb 14.4 oz  (84.324 kg). Her oral temperature is 98.6 F (37 C). Her blood pressure is 129/75 and her pulse is 92. Her respiration is 16.  The patient exhibited no palpable subclavicular or axillary adenopathy. Her lungs are clear upon auscultation and the heart has a regular rhythm and rate. The abdomen is soft and nontender with normal bowel sounds. No inguinal adenopathy is appreciated. On pelvic examination the external genitalia are unremarkable. A speculum exam is performed. There is some radiation changes noted along the anterior proximal vaginal vault. No mucosal lesions noted. A Pap smear was obtained of the proximal vagina. On bimanual and rectovaginal examination there no pelvic masses appreciated.  Lab Findings: Lab Results  Component Value Date   WBC 10.4 12/23/2014   HGB 13.5 12/23/2014   HCT 39.6 12/23/2014   MCV 86.4 12/23/2014   PLT 257.0 12/23/2014    Radiographic Findings: No results found.  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 She is successfully partaking in healthy and recommended methods of management in regards to the recovery of his disease. There was no evidence for recurrence of disease observed via clinical exam. A pap smear and pelvic exam was completed via today's (08/06/2015) radiation oncology appointment.   Plan: The patient is aware of her CT scan in November of 2016 to take place  before her follow up appointment with Dr. Cottie Banda. She has been instructed on the appropriate use and encouraged to continue the use of her provided vaginal dilator in regards to the management of her recovery. The patient is advised of her routine follow-up appointment with radiation oncology to take place in March or 2016. All vocalized questions and concerns have been addressed. If the patient develops and further questions or concerns in regards to her treatment or recovery, she has been encouraged  to contact Dr. Sondra Come, MD, PhD.   This document serves as a record of services personally performed by Gery Pray, MD. It was created on his behalf by Lenn Cal, a trained medical scribe. The creation of this record is based on the scribe's personal observations and the provider's statements to them. This document has been checked and approved by the attending provider.   ____________________________________  Blair Promise, PhD, MD

## 2015-08-06 NOTE — Progress Notes (Signed)
Jane Ruiz here for follow up.  She denies pain, nausea, bowel and bladder issues and vaginal/rectal bleeding.  She reports here energy level is improving.  She has lost 4 lbs since March and is trying to loose weight.  She is using her vaginal dilator.  BP 129/75 mmHg  Pulse 92  Temp(Src) 98.6 F (37 C) (Oral)  Resp 16  Ht 5\' 4"  (1.626 m)  Wt 185 lb 14.4 oz (84.324 kg)  BMI 31.89 kg/m2

## 2015-08-12 LAB — CYTOLOGY - PAP

## 2015-08-18 ENCOUNTER — Telehealth: Payer: Self-pay | Admitting: Oncology

## 2015-08-18 NOTE — Telephone Encounter (Signed)
Called Jane Ruiz and informed her of the good results on her pap smear per Dr. Kinard.  Evangelyn verbalized understanding.   

## 2015-10-19 ENCOUNTER — Other Ambulatory Visit: Payer: 59

## 2015-10-26 ENCOUNTER — Encounter: Payer: Self-pay | Admitting: Internal Medicine

## 2015-10-26 ENCOUNTER — Ambulatory Visit (INDEPENDENT_AMBULATORY_CARE_PROVIDER_SITE_OTHER): Payer: 59 | Admitting: Internal Medicine

## 2015-10-26 VITALS — BP 126/78 | HR 98 | Temp 97.9°F | Wt 185.0 lb

## 2015-10-26 DIAGNOSIS — R350 Frequency of micturition: Secondary | ICD-10-CM | POA: Diagnosis not present

## 2015-10-26 DIAGNOSIS — N390 Urinary tract infection, site not specified: Secondary | ICD-10-CM | POA: Diagnosis not present

## 2015-10-26 LAB — POCT URINALYSIS DIPSTICK
Bilirubin, UA: NEGATIVE
GLUCOSE UA: NEGATIVE
Ketones, UA: NEGATIVE
NITRITE UA: NEGATIVE
Spec Grav, UA: 1.01
UROBILINOGEN UA: NEGATIVE
pH, UA: 6.5

## 2015-10-26 MED ORDER — SULFAMETHOXAZOLE-TRIMETHOPRIM 800-160 MG PO TABS: 1.0000 | ORAL_TABLET | Freq: Two times a day (BID) | ORAL | Status: DC

## 2015-10-26 NOTE — Addendum Note (Signed)
Addended by: Lurlean Nanny on: 10/26/2015 04:52 PM   Modules accepted: Orders

## 2015-10-26 NOTE — Progress Notes (Signed)
HPI  Pt presents to the clinic today with c/o urinary urgency and frequency. This started last night. She also feels like she has to go but then only a small amount of urine comes out. She denies fever, chills, nausea or low back pain. She has not tried anything OTC. She has had a history of endometrial cancer s/p total hysterectomy. She denies history of kidney stones.   Review of Systems  Past Medical History  Diagnosis Date  . Migraine   . Hyperlipidemia     borderline high cholesterol  . Skin lesion     non maligant skin lesions on head  . History of radiation therapy 05/27/2013-07/01/2013    pelvis 45 gray in 25 fractions, 63 gray with high dose brachytherapy  . endometrial ca 02/12/13    endometrial, positive nodes    Family History  Problem Relation Age of Onset  . Diabetes Mother   . Kidney disease Mother   . Benign prostatic hyperplasia Father   . Diabetes Maternal Grandmother     Social History   Social History  . Marital Status: Married    Spouse Name: N/A  . Number of Children: N/A  . Years of Education: N/A   Occupational History  . Not on file.   Social History Main Topics  . Smoking status: Never Smoker   . Smokeless tobacco: Never Used  . Alcohol Use: No  . Drug Use: No  . Sexual Activity: Yes     Comment: menarche age 1, G61,  P4, menopause 2006   Other Topics Concern  . Not on file   Social History Narrative    No Known Allergies  Constitutional: Denies fever, malaise, fatigue, headache or abrupt weight changes.   GU: Pt reports urgency, frequency and pain. Denies dysuria, burning sensation, blood in urine, odor or discharge. Skin: Denies redness, rashes, lesions or ulcercations.   No other specific complaints in a complete review of systems (except as listed in HPI above).    Objective:   Physical Exam  BP 126/78 mmHg  Pulse 98  Temp(Src) 97.9 F (36.6 C) (Oral)  Wt 185 lb (83.915 kg)  SpO2 98%  Wt Readings from Last 3  Encounters:  10/26/15 185 lb (83.915 kg)  08/06/15 185 lb 14.4 oz (84.324 kg)  02/05/15 189 lb 1.6 oz (85.775 kg)    General: Appears her stated age, well developed, well nourished in NAD. Cardiovascular: Normal rate and rhythm. S1,S2 noted.   Pulmonary/Chest: Normal effort and positive vesicular breath sounds. No respiratory distress. No wheezes, rales or ronchi noted.  Abdomen: Soft. Normal bowel sounds. No distention or masses noted.  No CVA tenderness.      Assessment & Plan:   Urgency, Frequency, secondary to UTI:  Urinalysis: 1+ leuks, 3 + blood Will send urine culture eRx sent if for Meptra BID x 5 days OK to take AZO OTC Drink plenty of fluids  RTC as needed or if symptoms persist.

## 2015-10-26 NOTE — Patient Instructions (Signed)

## 2015-10-26 NOTE — Progress Notes (Signed)
Pre visit review using our clinic review tool, if applicable. No additional management support is needed unless otherwise documented below in the visit note. 

## 2015-10-28 ENCOUNTER — Encounter (HOSPITAL_COMMUNITY): Payer: Self-pay

## 2015-10-28 ENCOUNTER — Telehealth: Payer: Self-pay | Admitting: Gynecologic Oncology

## 2015-10-28 ENCOUNTER — Ambulatory Visit (HOSPITAL_COMMUNITY)
Admission: RE | Admit: 2015-10-28 | Discharge: 2015-10-28 | Disposition: A | Discharge status: Home / Self Care | Payer: 59 | Source: Ambulatory Visit | Attending: Gynecologic Oncology | Admitting: Gynecologic Oncology

## 2015-10-28 DIAGNOSIS — N329 Bladder disorder, unspecified: Secondary | ICD-10-CM | POA: Insufficient documentation

## 2015-10-28 DIAGNOSIS — D3501 Benign neoplasm of right adrenal gland: Secondary | ICD-10-CM | POA: Insufficient documentation

## 2015-10-28 DIAGNOSIS — C541 Malignant neoplasm of endometrium: Secondary | ICD-10-CM | POA: Insufficient documentation

## 2015-10-28 DIAGNOSIS — Z08 Encounter for follow-up examination after completed treatment for malignant neoplasm: Secondary | ICD-10-CM | POA: Insufficient documentation

## 2015-10-28 DIAGNOSIS — Z923 Personal history of irradiation: Secondary | ICD-10-CM | POA: Insufficient documentation

## 2015-10-28 DIAGNOSIS — K449 Diaphragmatic hernia without obstruction or gangrene: Secondary | ICD-10-CM | POA: Diagnosis not present

## 2015-10-28 LAB — URINE CULTURE

## 2015-10-28 MED ORDER — IOHEXOL 300 MG/ML  SOLN
100.0000 mL | Freq: Once | INTRAMUSCULAR | Status: AC | PRN
  Administered 2015-10-28: 100 mL via INTRAVENOUS

## 2015-10-28 NOTE — Telephone Encounter (Signed)
Left message for patient with CT scan results.  Advised to call the office for any questions or concerns. 

## 2015-11-04 ENCOUNTER — Ambulatory Visit: Payer: 59 | Attending: Gynecologic Oncology | Admitting: Gynecologic Oncology

## 2015-11-04 ENCOUNTER — Encounter: Payer: Self-pay | Admitting: Gynecologic Oncology

## 2015-11-04 DIAGNOSIS — C541 Malignant neoplasm of endometrium: Secondary | ICD-10-CM | POA: Insufficient documentation

## 2015-11-04 DIAGNOSIS — Z8542 Personal history of malignant neoplasm of other parts of uterus: Secondary | ICD-10-CM | POA: Diagnosis not present

## 2015-11-04 DIAGNOSIS — Z9221 Personal history of antineoplastic chemotherapy: Secondary | ICD-10-CM

## 2015-11-04 DIAGNOSIS — Z923 Personal history of irradiation: Secondary | ICD-10-CM

## 2015-11-04 NOTE — Patient Instructions (Signed)
Congratulations on your 2 year anniversary and negative CT scan. Please follow-up with Dr. Sondra Come in 3 months and return to see Dr. Alycia Rossetti in 6 months.

## 2015-11-04 NOTE — Progress Notes (Signed)
Consult Note: Gyn-Onc  Jane Ruiz 54 y.o. female  CC:  Chief Complaint  Patient presents with  . endometrial cancer    F/U    HPI:54 year old post-menopausal female who experienced vaginal bleeding beginning 11-28-12. She was seen in January by Dr Glori Bickers and sent to Dr Raliegh Ip.Fogleman. She had endometrial biopsy on 01-06-13 which showed endometroid adenocarcinoma with mucinous features; she had continuous spotting after that biopsy. She saw Dr Skeet Latch on 02-21-13 and was taken to total robotic hysterectomy/BSO/bilateral pelvic nodes, with frozen section showing grade 1 tumor with minimal myometrial involvement, such that paraaortic nodes were not evaluated. She was hospitalized just 1.5 days and required no pain medication after DC home. Final pathology [id number]), however, showed invasive with a well differentiated component and a high grade serous component. total size was 4.2 cm and invasion 0.3 cm where myometrium 1.8 cm thick; the serous component had LVSI and 3 of 7 pelvic nodes were involved with the serous component.   CT CAP on 02-25-13 had no findings of concern in the chest and no retroperitoneal adenopathy or other findings of concern for metastatic disease. Dr Sondra Come recommended IMRT after 3 cycles of chemotherapy and vaginal vault brachytherapy during the final 3 cycles of chemotherapy. Patient had 3 cycles of q 3 week taxol carbo from 03-15-13 thru 04-26-13, with neulasta day 2 and additional IVF, then external beam RT completed 07-01-13; chemo resumed with cycle 4 taxol carboplatin on 07-08-13, with HDR treatments x3 from 8-11 thru 07-30-13. Cycle 6 was delayed x 1 week due to thrombocytopenia, given on 08-26-13, with neulasta.   She had a CT scan performed on October 07, 2014 that revealed:  There is no focal abnormality in the liver or spleen. Small hiatal hernia is stable. Stomach is decompressed. The duodenum, pancreas, gallbladder, and left adrenal gland are unremarkable. 13 mm right  adrenal gland is stable. Tiny low-density lesion in the lower pole the right kidney Stable. Left kidney is unremarkable. No evidence for hydronephrosis or hydroureter on either side. No abdominal aortic aneurysm. There is no lymphadenopathy in the abdomen. No free abdominal fluid. Imaging through the pelvis shows no free intraperitoneal fluid. No pelvic sidewall lymphadenopathy. The small fluid collection seen along the left external iliac vasculature previously has resolved in the interval.   The terminal ileum and the appendix are normal. The proximal and mid sigmoid segments of the colon are position in the central anatomic pelvis in show some mild inner wall enhancement the lumen in this portion of the colon is relatively fixed in diameter compared to other areas of the colon. No definite evidence for stricture although the rectum is decompressed. Uterus and ovaries are surgically absent. Bone windows reveal no worrisome lytic or sclerotic osseous lesions.   IMPRESSION:  No evidence for lymphadenopathy or metastatic disease. The small fluid collection along the left pelvic sidewall seen previously has resolved in the interval. Fixed diameter of the proximal and mid sigmoid colon with slight hyper enhancement of the wall. This may reflect a degree of radiation change there is no evidence for overt distal colonic stricture at this time although the lumen does appear to be diffusely narrowed.   Stable 13 mm right adrenal nodule. Washout characteristics are compatible with benign adrenal adenoma.  Interval history: She last saw Dr. Sondra Come in Sept 2016 with a negative exam and pap smear. She comes in today for follow-up.  Follow up Ct on 10/28/15: IMPRESSION: 1. No evidence of local tumor recurrence at  the vaginal cuff. 2. No evidence of metastatic disease in the chest, abdomen or pelvis. 3. Nonspecific diffuse bladder wall thickening, which could represent chronic post radiation change, although  correlation with urinalysis should be considered to exclude acute cystitis. 4. Stable right adrenal adenoma. 5. Small to moderate hiatal hernia.  She's overall doing quite well. She did have a urinary tract infection last week. She's never had one before. She had some urgency on Sunday and that went to see her physician on Monday and was treated with antibiotics. The symptoms dissipated quite quickly. She does think that she is just been drinking less fluid that she's been very busy. She has not been exercising. She has a treadmill at home and admits that she would easily be able to use it. She's gained 5 pounds since we saw her in May. She was seen by Dr. Freddi Che in September. Her exam at that time was negative. Her Pap smear was also negative. She is very pleased with the results of her CT scan. We discussed results today. There are no new medical problems and her family. She is up-to-date on her mammograms.  Review of Systems:  Constitutional: Denies fever. Skin: No rash, sores, jaundice, itching, or dryness.  Cardiovascular: No chest pain, shortness of breath, or edema  Pulmonary: No cough or wheeze.  Gastro Intestinal: No nausea, vomiting, constipation, or diarrhea reported. No bright red blood per rectum or change in bowel movement.  Genitourinary: No frequency, urgency, or dysuria currently.  Denies vaginal bleeding and discharge.  Musculoskeletal: No myalgia, arthralgia, joint swelling or pain.  Neurologic: No weakness, numbness, or change in gait.  Psychology: No changes   Current Meds:  Outpatient Encounter Prescriptions as of 11/04/2015  Medication Sig  . atorvastatin (LIPITOR) 10 MG tablet TAKE 1 TABLET (10 MG TOTAL) BY MOUTH EVERY EVENING.  . calcium carbonate (TUMS EX) 750 MG chewable tablet Chew 2 tablets by mouth 2 (two) times daily.  . Multiple Vitamin (MULTIVITAMIN) tablet Take 1 tablet by mouth daily.   . Omega-3 Fatty Acids (FISH OIL) 1200 MG CAPS Take 1 capsule by mouth  daily.   . [DISCONTINUED] atorvastatin (LIPITOR) 10 MG tablet TAKE 1 TABLET BY MOUTH EVERY EVENING.  . [DISCONTINUED] sulfamethoxazole-trimethoprim (BACTRIM DS,SEPTRA DS) 800-160 MG tablet Take 1 tablet by mouth 2 (two) times daily.   No facility-administered encounter medications on file as of 11/04/2015.    Allergy: No Known Allergies  Social Hx:   Social History   Social History  . Marital Status: Married    Spouse Name: N/A  . Number of Children: N/A  . Years of Education: N/A   Occupational History  . Not on file.   Social History Main Topics  . Smoking status: Never Smoker   . Smokeless tobacco: Never Used  . Alcohol Use: No  . Drug Use: No  . Sexual Activity: Yes     Comment: menarche age 21, G70,  P49, menopause 2006   Other Topics Concern  . Not on file   Social History Narrative    Past Surgical Hx:  Past Surgical History  Procedure Laterality Date  . Rhinoplasty      breathing problems- Dr Ernesto Rutherford  . Robotic assisted total hysterectomy with bilateral salpingo oopherectomy N/A 02/12/2013    Procedure: ROBOTIC ASSISTED TOTAL HYSTERECTOMY WITH BILATERAL SALPINGO OOPHORECTOMY,  LYMPH NODES;  Surgeon: Imagene Gurney A. Alycia Rossetti, MD;  Location: [facility];  Service: Gynecology;  Laterality: N/A;  . Abdominal hysterectomy  02/12/13  Past Medical Hx:  Past Medical History  Diagnosis Date  . Migraine   . Hyperlipidemia     borderline high cholesterol  . Skin lesion     non maligant skin lesions on head  . History of radiation therapy 05/27/2013-07/01/2013    pelvis 45 gray in 25 fractions, 63 gray with high dose brachytherapy  . endometrial ca 02/12/13    endometrial, positive nodes    Oncology Hx:    Endometrial cancer ([place])   01/07/2013 Initial Diagnosis Endometrial cancer   02/12/2013 Surgery IIIC1 endometrioid grade 3   03/15/2013 - 08/26/2013 Chemotherapy Completed 6 cycles of paclitaxel and carboplatin with sandwich radiation therapy (completed 07/01/13). First thee  cycles of chemotherapy 03/15/13-04/26/13 then last three cycles of chemotherapy 07/08/13 thru 08/26/13.    Family Hx:  Family History  Problem Relation Age of Onset  . Diabetes Mother   . Kidney disease Mother   . Benign prostatic hyperplasia Father   . Diabetes Maternal Grandmother     Vitals:  There were no vitals taken for this visit.  Physical Exam: Well-nourished well-developed female in no acute distress. Brighter affect today than at other visits.  Neck: Supple, no lymphadenopathy no thyromegaly.  Lungs: Clear to auscultation bilaterally.  Cardiovascular: Regular rate and rhythm.  Abdomen: Well-healed laparoscopic incisions. Abdomen is soft, nontender, nondistended. There are no palpable masses. There is no hepatosplenomegaly.  Groins: No lymphadenopathy.  Extremities no edema.  Pelvic: Normal external female genitalia. Vagina is atrophic. Vaginal cuff is visualized. There are no visible lesions.  Bimanual examination reveals no masses or nodularity. Rectal confirms.  Assessment/Plan: 54 year-old with III C1 endometrial carcinoma. Serous histology. She's completed chemotherapy and radiation under "sandwich" fashion. Her last cycle of chemotherapy was in September 2014. She had a post treatment CT scan in November 2015 that was unremarkable. Her CT from a few weeks ago was also negative at 2 years.  Her exam today is negative.  She'll follow-up with Dr. Sondra Come in 3 months and will return to see me in 6 months. I would repeat a CT scan in November of 2017. I would perform them on an annual basis to her 3 year anniversary. I did not perform a Pap smear today she had a normal Pap smear on September 1.   Kort Stettler A., MD 11/04/2015, 11:51 AM

## 2015-12-27 ENCOUNTER — Telehealth: Payer: Self-pay | Admitting: Family Medicine

## 2015-12-27 DIAGNOSIS — Z0000 Encounter for general adult medical examination without abnormal findings: Secondary | ICD-10-CM

## 2015-12-27 NOTE — Telephone Encounter (Signed)
-----   Message from Ellamae Sia sent at 12/24/2015  1:17 PM EST ----- Regarding: Lab orders for Tuesday, 1.24.17 Patient is scheduled for CPX labs, please order future labs, Thanks , Karna Christmas

## 2015-12-29 ENCOUNTER — Other Ambulatory Visit (INDEPENDENT_AMBULATORY_CARE_PROVIDER_SITE_OTHER): Payer: 59

## 2015-12-29 DIAGNOSIS — Z0000 Encounter for general adult medical examination without abnormal findings: Secondary | ICD-10-CM | POA: Diagnosis not present

## 2015-12-29 DIAGNOSIS — C541 Malignant neoplasm of endometrium: Secondary | ICD-10-CM

## 2015-12-29 LAB — CBC WITH DIFFERENTIAL/PLATELET
BASOS PCT: 0.4 % (ref 0.0–3.0)
Basophils Absolute: 0 10*3/uL (ref 0.0–0.1)
EOS ABS: 0.1 10*3/uL (ref 0.0–0.7)
Eosinophils Relative: 1.8 % (ref 0.0–5.0)
HEMATOCRIT: 39.4 % (ref 36.0–46.0)
HEMOGLOBIN: 12.9 g/dL (ref 12.0–15.0)
LYMPHS PCT: 16 % (ref 12.0–46.0)
Lymphs Abs: 1.1 10*3/uL (ref 0.7–4.0)
MCHC: 32.8 g/dL (ref 30.0–36.0)
MCV: 86.4 fl (ref 78.0–100.0)
MONOS PCT: 10 % (ref 3.0–12.0)
Monocytes Absolute: 0.7 10*3/uL (ref 0.1–1.0)
NEUTROS ABS: 4.9 10*3/uL (ref 1.4–7.7)
Neutrophils Relative %: 71.8 % (ref 43.0–77.0)
PLATELETS: 289 10*3/uL (ref 150.0–400.0)
RBC: 4.56 Mil/uL (ref 3.87–5.11)
RDW: 14.6 % (ref 11.5–15.5)
WBC: 6.9 10*3/uL (ref 4.0–10.5)

## 2015-12-29 LAB — LIPID PANEL
CHOLESTEROL: 163 mg/dL (ref 0–200)
HDL: 36.7 mg/dL — AB (ref >39.00)
LDL Cholesterol: 94 mg/dL (ref 0–99)
NonHDL: 125.81
TRIGLYCERIDES: 161 mg/dL — AB (ref 0.0–149.0)
Total CHOL/HDL Ratio: 4
VLDL: 32.2 mg/dL (ref 0.0–40.0)

## 2015-12-29 LAB — COMPREHENSIVE METABOLIC PANEL
ALBUMIN: 4 g/dL (ref 3.5–5.2)
ALT: 14 U/L (ref 0–35)
AST: 16 U/L (ref 0–37)
Alkaline Phosphatase: 75 U/L (ref 39–117)
BUN: 11 mg/dL (ref 6–23)
CALCIUM: 9.7 mg/dL (ref 8.4–10.5)
CO2: 28 meq/L (ref 19–32)
CREATININE: 0.79 mg/dL (ref 0.40–1.20)
Chloride: 105 mEq/L (ref 96–112)
GFR: 79.72 mL/min (ref >60.00)
Glucose, Bld: 90 mg/dL (ref 70–99)
Potassium: 3.9 mEq/L (ref 3.5–5.1)
Sodium: 141 mEq/L (ref 135–145)
Total Bilirubin: 0.4 mg/dL (ref 0.2–1.2)
Total Protein: 7 g/dL (ref 6.0–8.3)

## 2015-12-29 LAB — TSH: TSH: 4.86 u[IU]/mL — ABNORMAL HIGH (ref 0.35–4.50)

## 2015-12-29 NOTE — Addendum Note (Signed)
Addended by: Ellamae Sia on: 12/29/2015 08:46 AM   Modules accepted: Orders

## 2016-01-04 ENCOUNTER — Encounter: Payer: Self-pay | Admitting: Family Medicine

## 2016-01-04 ENCOUNTER — Ambulatory Visit (INDEPENDENT_AMBULATORY_CARE_PROVIDER_SITE_OTHER): Payer: 59 | Admitting: Family Medicine

## 2016-01-04 VITALS — BP 122/72 | HR 72 | Temp 98.3°F | Ht 64.5 in | Wt 185.2 lb

## 2016-01-04 DIAGNOSIS — R946 Abnormal results of thyroid function studies: Secondary | ICD-10-CM

## 2016-01-04 DIAGNOSIS — Z0000 Encounter for general adult medical examination without abnormal findings: Secondary | ICD-10-CM | POA: Diagnosis not present

## 2016-01-04 DIAGNOSIS — E785 Hyperlipidemia, unspecified: Secondary | ICD-10-CM | POA: Diagnosis not present

## 2016-01-04 DIAGNOSIS — E669 Obesity, unspecified: Secondary | ICD-10-CM | POA: Diagnosis not present

## 2016-01-04 DIAGNOSIS — R7989 Other specified abnormal findings of blood chemistry: Secondary | ICD-10-CM

## 2016-01-04 DIAGNOSIS — E039 Hypothyroidism, unspecified: Secondary | ICD-10-CM | POA: Insufficient documentation

## 2016-01-04 DIAGNOSIS — C541 Malignant neoplasm of endometrium: Secondary | ICD-10-CM

## 2016-01-04 MED ORDER — ATORVASTATIN CALCIUM 10 MG PO TABS: ORAL_TABLET | ORAL | Status: DC

## 2016-01-04 NOTE — Assessment & Plan Note (Signed)
Pt continues oncology f/u- last visit was there in the fall with nl pap  Per pt -will hopefully sign off there in a year and f/u with gyn  Last CT neg- reassuring

## 2016-01-04 NOTE — Assessment & Plan Note (Signed)
Mildly Some fatigue-not changed Re check 1 mo with free T4 No goiter on exam

## 2016-01-04 NOTE — Progress Notes (Signed)
Subjective:    Patient ID: Jane Ruiz, female    DOB: [birth year], 55 y.o.   MRN: [id number]  HPI  Here for health maintenance exam and to review chronic medical problems    Doing well   Had derm visit= and froze a mole on breast   Wt is stable  bmi of 31 No exercise Is trying to make better food choices - feels better eating more healthy   Hep C screening - she declines/ is low risk   HIV screen -declines  Mm 2/16 neg- will schedule herself  Self breast exam- no lumps or changes    Gyn exam/pap neg 9/16 -hx of endometrial ca 2014-with oncology  Had breast exam also  Hysterectomy Had CT scan in the fall -was normal  Will likely get released from oncol in the next several years   Td 12/09  Colonoscopy 7/10- hyperplastic polyp -10 year recall  Neg stool kit/cards 2/15   Cholesterol Lab Results  Component Value Date   CHOL 163 12/29/2015   CHOL 179 12/23/2014   CHOL 165 12/06/2013   Lab Results  Component Value Date   HDL 36.70* 12/29/2015   HDL 34.50* 12/23/2014   HDL 31.70* 12/06/2013   Lab Results  Component Value Date   LDLCALC 94 12/29/2015   LDLCALC 111* 12/23/2014   LDLCALC 99 12/06/2013   Lab Results  Component Value Date   TRIG 161.0* 12/29/2015   TRIG 169.0* 12/23/2014   TRIG 172.0* 12/06/2013   Lab Results  Component Value Date   CHOLHDL 4 12/29/2015   CHOLHDL 5 12/23/2014   CHOLHDL 5 12/06/2013   Lab Results  Component Value Date   LDLDIRECT 172.4 12/24/2010   LDLDIRECT 174.9 09/17/2010   LDLDIRECT 164.2 07/06/2010   she is picking better foods  Not exercising - she has a busy schedule and it makes her tired since her cancer  On lipitor and diet  Stays away from fatty foods   Results for orders placed or performed in visit on 12/29/15  CBC with Differential/Platelet  Result Value Ref Range   WBC 6.9 4.0 - 10.5 K/uL   RBC 4.56 3.87 - 5.11 Mil/uL   Hemoglobin 12.9 12.0 - 15.0 g/dL   HCT 39.4 36.0 - 46.0 %   MCV 86.4 78.0 -  100.0 fl   MCHC 32.8 30.0 - 36.0 g/dL   RDW 14.6 11.5 - 15.5 %   Platelets 289.0 150.0 - 400.0 K/uL   Neutrophils Relative % 71.8 43.0 - 77.0 %   Lymphocytes Relative 16.0 12.0 - 46.0 %   Monocytes Relative 10.0 3.0 - 12.0 %   Eosinophils Relative 1.8 0.0 - 5.0 %   Basophils Relative 0.4 0.0 - 3.0 %   Neutro Abs 4.9 1.4 - 7.7 K/uL   Lymphs Abs 1.1 0.7 - 4.0 K/uL   Monocytes Absolute 0.7 0.1 - 1.0 K/uL   Eosinophils Absolute 0.1 0.0 - 0.7 K/uL   Basophils Absolute 0.0 0.0 - 0.1 K/uL  Comprehensive metabolic panel  Result Value Ref Range   Sodium 141 135 - 145 mEq/L   Potassium 3.9 3.5 - 5.1 mEq/L   Chloride 105 96 - 112 mEq/L   CO2 28 19 - 32 mEq/L   Glucose, Bld 90 70 - 99 mg/dL   BUN 11 6 - 23 mg/dL   Creatinine, Ser 0.79 0.40 - 1.20 mg/dL   Total Bilirubin 0.4 0.2 - 1.2 mg/dL   Alkaline Phosphatase 75 39 - 117 U/L  AST 16 0 - 37 U/L   ALT 14 0 - 35 U/L   Total Protein 7.0 6.0 - 8.3 g/dL   Albumin 4.0 3.5 - 5.2 g/dL   Calcium 9.7 8.4 - 10.5 mg/dL   GFR 79.72 >60.00 mL/min  Lipid panel  Result Value Ref Range   Cholesterol 163 0 - 200 mg/dL   Triglycerides 161.0 (H) 0.0 - 149.0 mg/dL   HDL 36.70 (L) >39.00 mg/dL   VLDL 32.2 0.0 - 40.0 mg/dL   LDL Cholesterol 94 0 - 99 mg/dL   Total CHOL/HDL Ratio 4    NonHDL 125.81   TSH  Result Value Ref Range   TSH 4.86 (H) 0.35 - 4.50 uIU/mL    Has been tired  Will plan to re check that    Patient Active Problem List   Diagnosis Date Noted  . Cancer ([place])   . Endometrial cancer ([place]) 02/12/2013  . Other screening mammogram 11/04/2011  . Obesity 11/04/2011  . Routine general medical examination at a health care facility 10/20/2011  . PLANTAR FASCIITIS 12/31/2010  . Hyperlipidemia 09/24/2010  . MENOPAUSAL SYNDROME 07/06/2010   Past Medical History  Diagnosis Date  . Migraine   . Hyperlipidemia     borderline high cholesterol  . Skin lesion     non maligant skin lesions on head  . History of radiation therapy  05/27/2013-07/01/2013    pelvis 45 gray in 25 fractions, 63 gray with high dose brachytherapy  . endometrial ca 02/12/13    endometrial, positive nodes   Past Surgical History  Procedure Laterality Date  . Rhinoplasty      breathing problems- Dr Ernesto Rutherford  . Robotic assisted total hysterectomy with bilateral salpingo oopherectomy N/A 02/12/2013    Procedure: ROBOTIC ASSISTED TOTAL HYSTERECTOMY WITH BILATERAL SALPINGO OOPHORECTOMY,  LYMPH NODES;  Surgeon: Imagene Gurney A. Alycia Rossetti, MD;  Location: [facility];  Service: Gynecology;  Laterality: N/A;  . Abdominal hysterectomy  02/12/13   Social History  Substance Use Topics  . Smoking status: Never Smoker   . Smokeless tobacco: Never Used  . Alcohol Use: No   Family History  Problem Relation Age of Onset  . Diabetes Mother   . Kidney disease Mother   . Benign prostatic hyperplasia Father   . Diabetes Maternal Grandmother    No Known Allergies Current Outpatient Prescriptions on File Prior to Visit  Medication Sig Dispense Refill  . atorvastatin (LIPITOR) 10 MG tablet TAKE 1 TABLET (10 MG TOTAL) BY MOUTH EVERY EVENING. 90 tablet 3  . calcium carbonate (TUMS EX) 750 MG chewable tablet Chew 2 tablets by mouth 2 (two) times daily.    . Multiple Vitamin (MULTIVITAMIN) tablet Take 1 tablet by mouth daily.     . Omega-3 Fatty Acids (FISH OIL) 1200 MG CAPS Take 1 capsule by mouth daily.      No current facility-administered medications on file prior to visit.    Review of Systems Review of Systems  Constitutional: Negative for fever, appetite change, fatigue and unexpected weight change.  Eyes: Negative for pain and visual disturbance.  Respiratory: Negative for cough and shortness of breath.   Cardiovascular: Negative for cp or palpitations    Gastrointestinal: Negative for nausea, diarrhea and constipation.  Genitourinary: Negative for urgency and frequency.  Skin: Negative for pallor or rash   Neurological: Negative for weakness, light-headedness,  and headaches. pos for neuropathy symptoms from chemo in the past (feet) Hematological: Negative for adenopathy. Does not bruise/bleed easily.  Psychiatric/Behavioral: Negative for  dysphoric mood. The patient is not nervous/anxious.         Objective:   Physical Exam  Constitutional: She appears well-developed and well-nourished. No distress.  obese and well appearing   HENT:  Head: Normocephalic and atraumatic.  Right Ear: External ear normal.  Left Ear: External ear normal.  Nose: Nose normal.  Mouth/Throat: Oropharynx is clear and moist.  Eyes: Conjunctivae and EOM are normal. Pupils are equal, round, and reactive to light. Right eye exhibits no discharge. Left eye exhibits no discharge. No scleral icterus.  Neck: Normal range of motion. Neck supple. No JVD present. Carotid bruit is not present. No thyromegaly present.  Cardiovascular: Normal rate, regular rhythm, normal heart sounds and intact distal pulses.  Exam reveals no gallop.   Pulmonary/Chest: Effort normal and breath sounds normal. No respiratory distress. She has no wheezes. She has no rales.  Abdominal: Soft. Bowel sounds are normal. She exhibits no distension and no mass. There is no tenderness.  Musculoskeletal: She exhibits no edema or tenderness.  Lymphadenopathy:    She has no cervical adenopathy.  Neurological: She is alert. She has normal reflexes. No cranial nerve deficit. She exhibits normal muscle tone. Coordination normal.  Skin: Skin is warm and dry. No rash noted. No erythema. No pallor.  SK under L breast-recently tx with cryo- is healing  Psychiatric: She has a normal mood and affect.          Assessment & Plan:   Problem List Items Addressed This Visit      Genitourinary   Endometrial cancer ([place])    Pt continues oncology f/u- last visit was there in the fall with nl pap  Per pt -will hopefully sign off there in a year and f/u with gyn  Last CT neg- reassuring         Other   Elevated TSH      Mildly Some fatigue-not changed Re check 1 mo with free T4 No goiter on exam       Relevant Orders   TSH   T4, Free   Hyperlipidemia    Some improvement  Disc goals for lipids and reasons to control them Rev labs with pt Rev low sat fat diet in detail Will continue statin and better diet       Relevant Medications   atorvastatin (LIPITOR) 10 MG tablet   Obesity    Discussed how this problem influences overall health and the risks it imposes  Reviewed plan for weight loss with lower calorie diet (via better food choices and also portion control or program like weight watchers) and exercise building up to or more than 30 minutes 5 days per week including some aerobic activity         Routine general medical examination at a health care facility - Primary    Reviewed health habits including diet and exercise and skin cancer prevention Reviewed appropriate screening tests for age  Also reviewed health mt list, fam hx and immunization status , as well as social and family history   See HPI Labs reviewed Think about starting exercise very slowly and advance gradually Continue healthy diet - cholesterol is improved Work on weight loss Your thyroid number is a bit off - we will re check it a month and take action if needed  Do schedule your annual mammogram

## 2016-01-04 NOTE — Patient Instructions (Signed)
Think about starting exercise very slowly and advance gradually Continue healthy diet - cholesterol is improved Work on weight loss Your thyroid number is a bit off - we will re check it a month and take action if needed  Do schedule your annual mammogram

## 2016-01-04 NOTE — Assessment & Plan Note (Signed)
Some improvement  Disc goals for lipids and reasons to control them Rev labs with pt Rev low sat fat diet in detail Will continue statin and better diet

## 2016-01-04 NOTE — Progress Notes (Signed)
Pre visit review using our clinic review tool, if applicable. No additional management support is needed unless otherwise documented below in the visit note. 

## 2016-01-04 NOTE — Assessment & Plan Note (Signed)
Reviewed health habits including diet and exercise and skin cancer prevention Reviewed appropriate screening tests for age  Also reviewed health mt list, fam hx and immunization status , as well as social and family history   See HPI Labs reviewed Think about starting exercise very slowly and advance gradually Continue healthy diet - cholesterol is improved Work on weight loss Your thyroid number is a bit off - we will re check it a month and take action if needed  Do schedule your annual mammogram

## 2016-01-04 NOTE — Assessment & Plan Note (Signed)
Discussed how this problem influences overall health and the risks it imposes  Reviewed plan for weight loss with lower calorie diet (via better food choices and also portion control or program like weight watchers) and exercise building up to or more than 30 minutes 5 days per week including some aerobic activity    

## 2016-02-04 ENCOUNTER — Encounter: Payer: Self-pay | Admitting: Radiation Oncology

## 2016-02-04 ENCOUNTER — Ambulatory Visit
Admission: RE | Admit: 2016-02-04 | Discharge: 2016-02-04 | Disposition: A | Discharge status: Home / Self Care | Payer: 59 | Source: Ambulatory Visit | Attending: Radiation Oncology | Admitting: Radiation Oncology

## 2016-02-04 VITALS — BP 128/71 | HR 75 | Temp 98.4°F | Resp 16 | Ht 64.5 in | Wt 188.6 lb

## 2016-02-04 DIAGNOSIS — C541 Malignant neoplasm of endometrium: Secondary | ICD-10-CM

## 2016-02-04 NOTE — Progress Notes (Signed)
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 She denies having any urinary issues or bowel issues.She reports having a slight amount of spotting on her vaginal dilator two weeks ago. She said it was very "faint." She has not noticed any bleeding since. She reports having a good appetite and her energy level is getting better.   ALLERGIES:  has No Known Allergies.  Meds: Current Outpatient Prescriptions  Medication Sig Dispense Refill  . atorvastatin (LIPITOR) 10 MG tablet TAKE 1 TABLET (10 MG TOTAL) BY MOUTH EVERY EVENING. 90 tablet 3  . calcium carbonate (TUMS EX) 750 MG chewable tablet Chew 2 tablets by mouth 2 (two) times daily.    . Multiple Vitamin (MULTIVITAMIN) tablet Take 1 tablet by mouth daily.     . Omega-3 Fatty Acids (FISH OIL) 1200 MG CAPS Take 1 capsule by mouth daily.      No current facility-administered medications for this encounter.    Physical Findings:, Patient is alert and oriented x3 and is in no acute distress.  height is 5' 4.5" (1.638 m) and weight is 188 lb 9.6 oz (85.548 kg). Her oral temperature is 98.4 F (36.9 C). Her blood pressure is 128/71 and her pulse is 75. Her respiration is 16.  The patient exhibited no palpable supraclavicular or axillary adenopathy. Her lungs are clear upon auscultation and the heart has a regular rhythm and rate. The abdomen is soft and nontender with normal bowel sounds. No inguinal adenopathy is appreciated. On pelvic examination there is a erythematous lesion in the right side of the vaginal cuff, not palpable; On bimanual and rectovaginal examination there no pelvic masses  appreciated.   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  Questionable erythematous lesion along the right vaginal cuff.  Plan: Will refer  gyneocologic oncology earlier than scheduled to evaluate the vaginal cuff area. Otherwise routine follow-up in late August or early September.     ____________________________________  Blair Promise, PhD, MD  This document serves as a record of services personally performed by Gery Pray, MD. It was created on his behalf by Derek Mound, a trained medical scribe. The creation of this record is based on the scribe's personal observations and the provider's statements to them. This document has been checked and approved by the attending provider.

## 2016-02-04 NOTE — Progress Notes (Signed)
[name] here for follow up.  She denies having any urinary issues or bowel issues.  She reports having a slight amount of spotting on her vaginal dilator two weeks ago.  She said it was very "faint." She has not noticed any bleeding since.  She reports having a good appetite and her energy level is getting better.  BP 128/71 mmHg  Pulse 75  Temp(Src) 98.4 F (36.9 C) (Oral)  Resp 16  Ht 5' 4.5" (1.638 m)  Wt 188 lb 9.6 oz (85.548 kg)  BMI 31.88 kg/m2   Wt Readings from Last 3 Encounters:  02/04/16 188 lb 9.6 oz (85.548 kg)  01/04/16 185 lb 4 oz (84.029 kg)  10/26/15 185 lb (83.915 kg)

## 2016-02-05 ENCOUNTER — Other Ambulatory Visit (INDEPENDENT_AMBULATORY_CARE_PROVIDER_SITE_OTHER): Payer: 59

## 2016-02-05 DIAGNOSIS — R7989 Other specified abnormal findings of blood chemistry: Secondary | ICD-10-CM

## 2016-02-05 DIAGNOSIS — R946 Abnormal results of thyroid function studies: Secondary | ICD-10-CM | POA: Diagnosis not present

## 2016-02-05 LAB — TSH: TSH: 3.01 u[IU]/mL (ref 0.35–4.50)

## 2016-02-05 LAB — T4, FREE: FREE T4: 0.9 ng/dL (ref 0.60–1.60)

## 2016-02-08 ENCOUNTER — Encounter: Payer: Self-pay | Admitting: *Deleted

## 2016-02-10 ENCOUNTER — Ambulatory Visit: Payer: 59 | Attending: Gynecologic Oncology | Admitting: Gynecologic Oncology

## 2016-02-10 ENCOUNTER — Other Ambulatory Visit (HOSPITAL_COMMUNITY)
Admission: RE | Admit: 2016-02-10 | Discharge: 2016-02-10 | Disposition: A | Discharge status: Home / Self Care | Payer: 59 | Source: Ambulatory Visit | Attending: Gynecologic Oncology | Admitting: Gynecologic Oncology

## 2016-02-10 ENCOUNTER — Encounter: Payer: Self-pay | Admitting: Gynecologic Oncology

## 2016-02-10 DIAGNOSIS — C541 Malignant neoplasm of endometrium: Secondary | ICD-10-CM

## 2016-02-10 DIAGNOSIS — K449 Diaphragmatic hernia without obstruction or gangrene: Secondary | ICD-10-CM | POA: Diagnosis not present

## 2016-02-10 DIAGNOSIS — Z923 Personal history of irradiation: Secondary | ICD-10-CM | POA: Insufficient documentation

## 2016-02-10 DIAGNOSIS — Z01411 Encounter for gynecological examination (general) (routine) with abnormal findings: Secondary | ICD-10-CM | POA: Insufficient documentation

## 2016-02-10 DIAGNOSIS — Z8542 Personal history of malignant neoplasm of other parts of uterus: Secondary | ICD-10-CM | POA: Insufficient documentation

## 2016-02-10 DIAGNOSIS — D3501 Benign neoplasm of right adrenal gland: Secondary | ICD-10-CM | POA: Diagnosis not present

## 2016-02-10 DIAGNOSIS — Z90710 Acquired absence of both cervix and uterus: Secondary | ICD-10-CM | POA: Insufficient documentation

## 2016-02-10 DIAGNOSIS — E785 Hyperlipidemia, unspecified: Secondary | ICD-10-CM | POA: Insufficient documentation

## 2016-02-10 DIAGNOSIS — Z08 Encounter for follow-up examination after completed treatment for malignant neoplasm: Secondary | ICD-10-CM | POA: Insufficient documentation

## 2016-02-10 DIAGNOSIS — Z90722 Acquired absence of ovaries, bilateral: Secondary | ICD-10-CM | POA: Diagnosis not present

## 2016-02-10 NOTE — Progress Notes (Signed)
Consult Note: Gyn-Onc  Jane Ruiz 55 y.o. female  CC:  Chief Complaint  Patient presents with  . Endometrial Cancer    Follow up    HPI:55 year old post-menopausal female who experienced vaginal bleeding beginning 11-28-12. She was seen in January by Dr Glori Bickers and sent to Dr Raliegh Ip.Fogleman. She had endometrial biopsy on 01-06-13 which showed endometroid adenocarcinoma with mucinous features; she had continuous spotting after that biopsy. She saw Dr Skeet Latch on 02-21-13 and was taken to total robotic hysterectomy/BSO/bilateral pelvic nodes, with frozen section showing grade 1 tumor with minimal myometrial involvement, such that paraaortic nodes were not evaluated. She was hospitalized just 1.5 days and required no pain medication after DC home. Final pathology [id number]), however, showed invasive with a well differentiated component and a high grade serous component. total size was 4.2 cm and invasion 0.3 cm where myometrium 1.8 cm thick; the serous component had LVSI and 3 of 7 pelvic nodes were involved with the serous component.   CT CAP on 02-25-13 had no findings of concern in the chest and no retroperitoneal adenopathy or other findings of concern for metastatic disease. Dr Sondra Come recommended IMRT after 3 cycles of chemotherapy and vaginal vault brachytherapy during the final 3 cycles of chemotherapy. Patient had 3 cycles of q 3 week taxol carbo from 03-15-13 thru 04-26-13, with neulasta day 2 and additional IVF, then external beam RT completed 07-01-13; chemo resumed with cycle 4 taxol carboplatin on 07-08-13, with HDR treatments x3 from 8-11 thru 07-30-13. Cycle 6 was delayed x 1 week due to thrombocytopenia, given on 08-26-13, with neulasta.   She had a CT scan performed on October 07, 2014 that revealed:  There is no focal abnormality in the liver or spleen. Small hiatal hernia is stable. Stomach is decompressed. The duodenum, pancreas, gallbladder, and left adrenal gland are unremarkable. 13 mm right  adrenal gland is stable. Tiny low-density lesion in the lower pole the right kidney Stable. Left kidney is unremarkable. No evidence for hydronephrosis or hydroureter on either side. No abdominal aortic aneurysm. There is no lymphadenopathy in the abdomen. No free abdominal fluid. Imaging through the pelvis shows no free intraperitoneal fluid. No pelvic sidewall lymphadenopathy. The small fluid collection seen along the left external iliac vasculature previously has resolved in the interval.   The terminal ileum and the appendix are normal. The proximal and mid sigmoid segments of the colon are position in the central anatomic pelvis in show some mild inner wall enhancement the lumen in this portion of the colon is relatively fixed in diameter compared to other areas of the colon. No definite evidence for stricture although the rectum is decompressed. Uterus and ovaries are surgically absent. Bone windows reveal no worrisome lytic or sclerotic osseous lesions.   IMPRESSION:  No evidence for lymphadenopathy or metastatic disease. The small fluid collection along the left pelvic sidewall seen previously has resolved in the interval. Fixed diameter of the proximal and mid sigmoid colon with slight hyper enhancement of the wall. This may reflect a degree of radiation change there is no evidence for overt distal colonic stricture at this time although the lumen does appear to be diffusely narrowed.   Stable 13 mm right adrenal nodule. Washout characteristics are compatible with benign adrenal adenoma. Follow up Ct on 10/28/15: IMPRESSION: 1. No evidence of local tumor recurrence at the vaginal cuff. 2. No evidence of metastatic disease in the chest, abdomen or pelvis. 3. Nonspecific diffuse bladder wall thickening, which could represent  chronic post radiation change, although correlation with urinalysis should be considered to exclude acute cystitis. 4. Stable right adrenal adenoma. 5. Small to moderate  hiatal hernia.  Interval history: I last saw her in November 2016 at which time her exam was unremarkable. She had a Pap smear in September therefore I did not repeat one. Her imaging was unremarkable. She was seen by Dr. Sondra Come on March 2. At that time she had commented to him that she had a slight amount of spotting on a vaginal dilator 2 weeks prior. She had not had any bleeding since then. On exam she had an erythematous lesion on the right side of the vaginal cuff that was not palpable. Therefore he wanted [place] to see her today. It does not appear that she had a Pap smear performed at that time.  She is very tearful and worried today. She does use her vaginal dilator Monday Wednesdays and Fridays. Her exam was normal last Thursday. She is sexually active and has not complained of any dyspareunia or postcoital bleeding. Other than those episodes of some pink tinge staining on the end of the dilator 2 weeks ago she's had no further bleeding. She denies any abdominal pelvic pain. She denies any change in her bowel or bladder habits.  Current Meds:  Outpatient Encounter Prescriptions as of 02/10/2016  Medication Sig  . atorvastatin (LIPITOR) 10 MG tablet TAKE 1 TABLET (10 MG TOTAL) BY MOUTH EVERY EVENING.  . calcium carbonate (TUMS EX) 750 MG chewable tablet Chew 2 tablets by mouth 2 (two) times daily.  . Multiple Vitamin (MULTIVITAMIN) tablet Take 1 tablet by mouth daily.   . Omega-3 Fatty Acids (FISH OIL) 1200 MG CAPS Take 1 capsule by mouth daily.    No facility-administered encounter medications on file as of 02/10/2016.    Allergy: No Known Allergies  Social Hx:   Social History   Social History  . Marital Status: Married    Spouse Name: N/A  . Number of Children: N/A  . Years of Education: N/A   Occupational History  . Not on file.   Social History Main Topics  . Smoking status: Never Smoker   . Smokeless tobacco: Never Used  . Alcohol Use: No  . Drug Use: No  . Sexual Activity:  Yes     Comment: menarche age 31, G98,  P96, menopause 2006   Other Topics Concern  . Not on file   Social History Narrative    Past Surgical Hx:  Past Surgical History  Procedure Laterality Date  . Rhinoplasty      breathing problems- Dr Ernesto Rutherford  . Robotic assisted total hysterectomy with bilateral salpingo oopherectomy N/A 02/12/2013    Procedure: ROBOTIC ASSISTED TOTAL HYSTERECTOMY WITH BILATERAL SALPINGO OOPHORECTOMY,  LYMPH NODES;  Surgeon: Imagene Gurney A. Alycia Rossetti, MD;  Location: [facility];  Service: Gynecology;  Laterality: N/A;  . Abdominal hysterectomy  02/12/13    Past Medical Hx:  Past Medical History  Diagnosis Date  . Migraine   . Hyperlipidemia     borderline high cholesterol  . Skin lesion     non maligant skin lesions on head  . History of radiation therapy 05/27/2013-07/01/2013    pelvis 45 gray in 25 fractions, 63 gray with high dose brachytherapy  . endometrial ca 02/12/13    endometrial, positive nodes    Oncology Hx:    Endometrial cancer ([place])   01/07/2013 Initial Diagnosis Endometrial cancer   02/12/2013 Surgery IIIC1 endometrioid grade 3   03/15/2013 -  08/26/2013 Chemotherapy Completed 6 cycles of paclitaxel and carboplatin with sandwich radiation therapy (completed 07/01/13). First thee cycles of chemotherapy 03/15/13-04/26/13 then last three cycles of chemotherapy 07/08/13 thru 08/26/13.    Family Hx:  Family History  Problem Relation Age of Onset  . Diabetes Mother   . Kidney disease Mother   . Benign prostatic hyperplasia Father   . Diabetes Maternal Grandmother     Vitals:  Blood pressure 145/71, pulse 98, temperature 97.9 F (36.6 C), temperature source Oral, resp. rate 18, height 5' 4.5" (1.638 m), weight 185 lb 14.4 oz (84.324 kg), SpO2 99 %.  Physical Exam: Well-nourished well-developed female in no acute distress.   Abdomen: Well-healed laparoscopic incisions. Abdomen is soft, nontender, nondistended. There are no palpable masses. There is no  hepatosplenomegaly.  Pelvic: Normal external female genitalia. Vagina is atrophic. Vaginal cuff is visualized. There are no visible lesions. There is a small 1 cm slightly erythematous patch on the right side of the vagina but does not appear to be consistent with recurrent disease. Pap smear submitted without difficulty  Bimanual examination reveals no masses or nodularity. Rectal confirms.  Assessment/Plan: 55 year-old with III C1 endometrial carcinoma. Serous histology. She's completed chemotherapy and radiation under "sandwich" fashion. Her last cycle of chemotherapy was in September 2014.   We will follow-up in results for Pap smear from today. We will notify her of the results. Pending that it is normal she will keep her appointment with [place] in May and then follow-up with Dr. Sondra Come as scheduled in August. Her last CT scan at 2 years in November 2016 was negative. We'll plan on repeating a CT scan in November 2017.   Travarius Lange A., MD 02/10/2016, 10:17 AM

## 2016-02-10 NOTE — Patient Instructions (Signed)
We will contact you with your PAP results , follow up as previously scheduled in May with Dr Alycia Rossetti  Thank you!

## 2016-02-12 LAB — CYTOLOGY - PAP

## 2016-02-15 ENCOUNTER — Telehealth: Payer: Self-pay

## 2016-02-15 NOTE — Telephone Encounter (Signed)
Orders received from [place] to contact the patient with "normal" results of PAP collected on 02/10/2016 during visit with Dr Alycia Rossetti . Attempted to contact the patient , no answer , left a detailed message with call back information if additional questions arise.

## 2016-04-20 ENCOUNTER — Encounter: Payer: Self-pay | Admitting: Gynecologic Oncology

## 2016-04-20 ENCOUNTER — Ambulatory Visit: Payer: 59 | Attending: Gynecologic Oncology | Admitting: Gynecologic Oncology

## 2016-04-20 VITALS — BP 146/82 | HR 94 | Temp 99.0°F | Resp 18 | Ht 64.5 in | Wt 187.8 lb

## 2016-04-20 DIAGNOSIS — Z923 Personal history of irradiation: Secondary | ICD-10-CM | POA: Insufficient documentation

## 2016-04-20 DIAGNOSIS — C541 Malignant neoplasm of endometrium: Secondary | ICD-10-CM | POA: Diagnosis present

## 2016-04-20 DIAGNOSIS — D3500 Benign neoplasm of unspecified adrenal gland: Secondary | ICD-10-CM | POA: Diagnosis not present

## 2016-04-20 DIAGNOSIS — Z9889 Other specified postprocedural states: Secondary | ICD-10-CM | POA: Diagnosis not present

## 2016-04-20 DIAGNOSIS — Z9221 Personal history of antineoplastic chemotherapy: Secondary | ICD-10-CM | POA: Diagnosis not present

## 2016-04-20 DIAGNOSIS — Z8542 Personal history of malignant neoplasm of other parts of uterus: Secondary | ICD-10-CM | POA: Diagnosis not present

## 2016-04-20 DIAGNOSIS — K449 Diaphragmatic hernia without obstruction or gangrene: Secondary | ICD-10-CM | POA: Diagnosis not present

## 2016-04-20 DIAGNOSIS — Z79899 Other long term (current) drug therapy: Secondary | ICD-10-CM | POA: Diagnosis not present

## 2016-04-20 DIAGNOSIS — E785 Hyperlipidemia, unspecified: Secondary | ICD-10-CM | POA: Diagnosis not present

## 2016-04-20 NOTE — Progress Notes (Signed)
Consult Note: Gyn-Onc  Jane Ruiz 55 y.o. female  CC:  Chief Complaint  Patient presents with  . Follow-up    HPI:55 year old post-menopausal female who experienced vaginal bleeding beginning 11-28-12. She was seen in January by Dr Glori Bickers and sent to Dr Raliegh Ip.Fogleman. She had endometrial biopsy on 01-06-13 which showed endometroid adenocarcinoma with mucinous features; she had continuous spotting after that biopsy. She saw Dr Skeet Latch on 02-21-13 and was taken to total robotic hysterectomy/BSO/bilateral pelvic nodes, with frozen section showing grade 1 tumor with minimal myometrial involvement, such that paraaortic nodes were not evaluated. She was hospitalized just 1.5 days and required no pain medication after DC home. Final pathology [id number]), however, showed invasive with a well differentiated component and a high grade serous component. total size was 4.2 cm and invasion 0.3 cm where myometrium 1.8 cm thick; the serous component had LVSI and 3 of 7 pelvic nodes were involved with the serous component.   CT CAP on 02-25-13 had no findings of concern in the chest and no retroperitoneal adenopathy or other findings of concern for metastatic disease. Dr Sondra Come recommended IMRT after 3 cycles of chemotherapy and vaginal vault brachytherapy during the final 3 cycles of chemotherapy. Patient had 3 cycles of q 3 week taxol carbo from 03-15-13 thru 04-26-13, with neulasta day 2 and additional IVF, then external beam RT completed 07-01-13; chemo resumed with cycle 4 taxol carboplatin on 07-08-13, with HDR treatments x3 from 8-11 thru 07-30-13. Cycle 6 was delayed x 1 week due to thrombocytopenia, given on 08-26-13, with neulasta.   She had a CT scan performed on October 07, 2014 that revealed:  There is no focal abnormality in the liver or spleen. Small hiatal hernia is stable. Stomach is decompressed. The duodenum, pancreas, gallbladder, and left adrenal gland are unremarkable. 13 mm right adrenal gland is  stable. Tiny low-density lesion in the lower pole the right kidney Stable. Left kidney is unremarkable. No evidence for hydronephrosis or hydroureter on either side. No abdominal aortic aneurysm. There is no lymphadenopathy in the abdomen. No free abdominal fluid. Imaging through the pelvis shows no free intraperitoneal fluid. No pelvic sidewall lymphadenopathy. The small fluid collection seen along the left external iliac vasculature previously has resolved in the interval.   The terminal ileum and the appendix are normal. The proximal and mid sigmoid segments of the colon are position in the central anatomic pelvis in show some mild inner wall enhancement the lumen in this portion of the colon is relatively fixed in diameter compared to other areas of the colon. No definite evidence for stricture although the rectum is decompressed. Uterus and ovaries are surgically absent. Bone windows reveal no worrisome lytic or sclerotic osseous lesions.   IMPRESSION:  No evidence for lymphadenopathy or metastatic disease. The small fluid collection along the left pelvic sidewall seen previously has resolved in the interval. Fixed diameter of the proximal and mid sigmoid colon with slight hyper enhancement of the wall. This may reflect a degree of radiation change there is no evidence for overt distal colonic stricture at this time although the lumen does appear to be diffusely narrowed.   Stable 13 mm right adrenal nodule. Washout characteristics are compatible with benign adrenal adenoma.  Follow up CT on 10/28/15: IMPRESSION: 1. No evidence of local tumor recurrence at the vaginal cuff. 2. No evidence of metastatic disease in the chest, abdomen or pelvis. 3. Nonspecific diffuse bladder wall thickening, which could represent chronic post radiation change, although  correlation with urinalysis should be considered to exclude acute cystitis. 4. Stable right adrenal adenoma. 5. Small to moderate hiatal  hernia.  Interval history: She was seen by Dr. Sondra Come on February 04, 2016. At that time she had commented to him that she had a slight amount of spotting on a vaginal dilator 2 weeks prior. She had not had any bleeding since then. On exam she had an erythematous lesion on the right side of the vaginal cuff that was not palpable. I last saw her March 8 and her exam at that time revealed a small 1 cm slightly erythematous patch on the right side of the vagina but did not appear to be consistent with recurrent disease. Pap smear at that time was unremarkable. She comes in today for her regularly scheduled evaluation. She really has no complaints. She had some loose stools yesterday which are better today. She's not sure if it's related to any food that she's eating. Her last colonoscopy was at the age of 7 and they recommended follow-up in 72 years. She was due for her mammogram in January but she has had a mole frozen off and did not keep that appointment. She states it's been on her mind to get that scheduled. There are no new medical problems and her family. Her husband is retiring at the end of the urinary point to me changing insurance.  Review of Systems  Constitutional: Denies fever. Cardiovascular: No chest pain, shortness of breath, or edema  Pulmonary: No cough  Gastro Intestinal: No nausea, vomiting, constipation, or diarrhea reported, though she intermittently has some loose stools.. No change in bowel movement.  Genitourinary: Denies vaginal bleeding and discharge.  Neurologic: No weakness  Current Meds:  Outpatient Encounter Prescriptions as of 04/20/2016  Medication Sig  . atorvastatin (LIPITOR) 10 MG tablet TAKE 1 TABLET (10 MG TOTAL) BY MOUTH EVERY EVENING.  . calcium carbonate (TUMS EX) 750 MG chewable tablet Chew 2 tablets by mouth 2 (two) times daily.  . Multiple Vitamin (MULTIVITAMIN) tablet Take 1 tablet by mouth daily.   . Omega-3 Fatty Acids (FISH OIL) 1200 MG CAPS Take 1 capsule  by mouth daily.    No facility-administered encounter medications on file as of 04/20/2016.    Allergy: No Known Allergies  Social Hx:   Social History   Social History  . Marital Status: Married    Spouse Name: N/A  . Number of Children: N/A  . Years of Education: N/A   Occupational History  . Not on file.   Social History Main Topics  . Smoking status: Never Smoker   . Smokeless tobacco: Never Used  . Alcohol Use: No  . Drug Use: No  . Sexual Activity: Yes     Comment: menarche age 66, G58,  P49, menopause 2006   Other Topics Concern  . Not on file   Social History Narrative    Past Surgical Hx:  Past Surgical History  Procedure Laterality Date  . Rhinoplasty      breathing problems- Dr Ernesto Rutherford  . Robotic assisted total hysterectomy with bilateral salpingo oopherectomy N/A 02/12/2013    Procedure: ROBOTIC ASSISTED TOTAL HYSTERECTOMY WITH BILATERAL SALPINGO OOPHORECTOMY,  LYMPH NODES;  Surgeon: Imagene Gurney A. Alycia Rossetti, MD;  Location: [facility];  Service: Gynecology;  Laterality: N/A;  . Abdominal hysterectomy  02/12/13    Past Medical Hx:  Past Medical History  Diagnosis Date  . Migraine   . Hyperlipidemia     borderline high cholesterol  . Skin  lesion     non maligant skin lesions on head  . History of radiation therapy 05/27/2013-07/01/2013    pelvis 45 gray in 25 fractions, 63 gray with high dose brachytherapy  . endometrial ca 02/12/13    endometrial, positive nodes    Oncology Hx:    Endometrial cancer ([place])   01/07/2013 Initial Diagnosis Endometrial cancer   02/12/2013 Surgery IIIC1 endometrioid grade 3   03/15/2013 - 08/26/2013 Chemotherapy Completed 6 cycles of paclitaxel and carboplatin with sandwich radiation therapy (completed 07/01/13). First thee cycles of chemotherapy 03/15/13-04/26/13 then last three cycles of chemotherapy 07/08/13 thru 08/26/13.    Family Hx:  Family History  Problem Relation Age of Onset  . Diabetes Mother   . Kidney disease Mother   .  Benign prostatic hyperplasia Father   . Diabetes Maternal Grandmother     Vitals:  Blood pressure 146/82, pulse 94, temperature 99 F (37.2 C), temperature source Oral, resp. rate 18, height 5' 4.5" (1.638 m), weight 187 lb 12.8 oz (85.186 kg), SpO2 98 %.  Physical Exam: Well-nourished well-developed female in no acute distress.   Neck: Supple, no lymphadenopathy, no thyromegaly.  Lungs: Clear to auscultation bilaterally.  Cardiovascular: Regular rate and rhythm  Abdomen: Well-healed laparoscopic incisions. Abdomen is soft, nontender, nondistended. There are no palpable masses. There is no hepatosplenomegaly. No appreciable hernias.  Groins: No lymphadenopathy.  Extremity: No edema.  Pelvic: Normal external female genitalia. Vagina is atrophic. Vaginal cuff is visualized there is a small partially 5 mm area of erythema right side of the vaginal cuff it is flat and not polypoid. There are no other visible lesions.  Bimanual examination reveals no masses or nodularity. Rectal confirms.  Assessment/Plan: 55 year-old with III C1 endometrial carcinoma. Serous histology. She's completed chemotherapy and radiation under "sandwich" fashion. Her last cycle of chemotherapy was in September 2014.   Her exam today is unremarkable, she will follow-up with Dr. Sondra Come as scheduled on July 07, 2016. Her last CT scan at 1 years in November 2016 was negative. We'll plan on repeating a CT scan in November 2017 with a visit with me soon thereafter. At that time will be her 3 year anniversary from completion of treatment and she can go to yearly visits with the each of [place] alternating every 6 months.   Tameia Rafferty A., MD 04/20/2016, 12:18 PM

## 2016-04-20 NOTE — Patient Instructions (Signed)
Keep your appointment with Dr. Sondra Come as scheduled on August 3. We will need to see you approximately 3 months after that with a CT scan performed just prior to your visit.

## 2016-05-17 LAB — HM MAMMOGRAPHY

## 2016-05-18 ENCOUNTER — Encounter: Payer: Self-pay | Admitting: Family Medicine

## 2016-05-19 ENCOUNTER — Encounter: Payer: Self-pay | Admitting: *Deleted

## 2016-07-07 ENCOUNTER — Ambulatory Visit
Admission: RE | Admit: 2016-07-07 | Discharge: 2016-07-07 | Disposition: A | Discharge status: Home / Self Care | Payer: 59 | Source: Ambulatory Visit | Attending: Radiation Oncology | Admitting: Radiation Oncology

## 2016-07-07 VITALS — BP 131/72 | HR 85 | Temp 98.5°F | Ht 64.5 in | Wt 188.0 lb

## 2016-07-07 DIAGNOSIS — C541 Malignant neoplasm of endometrium: Secondary | ICD-10-CM | POA: Diagnosis present

## 2016-07-07 NOTE — Progress Notes (Signed)
Jane Ruiz here for follow up.  She denies having pain, bladder/bowel issues, nausea, vaginal/rectal bleeding or discharge.  She reports her energy level is improving.  She is using her vaginal dilator.  She has been using a size M and would like a smaller size.  BP 131/72 (BP Location: Right Arm, Patient Position: Sitting)   Pulse 85   Temp 98.5 F (36.9 C) (Oral)   Ht 5' 4.5" (1.638 m)   Wt 188 lb (85.3 kg)   SpO2 97%   BMI 31.77 kg/m    Wt Readings from Last 3 Encounters:  07/07/16 188 lb (85.3 kg)  04/20/16 187 lb 12.8 oz (85.2 kg)  02/10/16 185 lb 14.4 oz (84.3 kg)

## 2016-07-07 NOTE — Progress Notes (Signed)
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 07/15/13, 07/22/13, 07/30/13 HDR treatments: Pelvis 45 gray in 25 fractions,  the proximal vagina was boosted further to 63 gray via high dose rate intracavitary brachytherapy treatments with Iridium 192 as the HDR source (3 cm diameter cylinder, 3 cm treatment length).  Narrative:  Jane Ruiz here for follow up.Her last Pap smear was on 02/10/16; negative for malignancy. She denies having pain, bladder/bowel issues, nausea, vaginal/rectal bleeding or discharge.  She reports her energy level is improving.  She is using her vaginal dilator.  She has been using a size M and would like a smaller size. She denies bleeding during the use of the dilator. She saw Dr. Alycia Rossetti on 04/20/16 who noted an unremarkable pelvic exam.  ALLERGIES:  has No Known Allergies.  Meds: Current Outpatient Prescriptions  Medication Sig Dispense Refill  . atorvastatin (LIPITOR) 10 MG tablet TAKE 1 TABLET (10 MG TOTAL) BY MOUTH EVERY EVENING. 90 tablet 3  . calcium carbonate (TUMS EX) 750 MG chewable tablet Chew 2 tablets by mouth 2 (two) times daily.    . Multiple Vitamin (MULTIVITAMIN) tablet Take 1 tablet by mouth daily.     . Omega-3 Fatty Acids (FISH OIL) 1200 MG CAPS Take 1 capsule by mouth daily.      No current facility-administered medications for this encounter.     Physical Findings:, Patient is alert and oriented x3 and is in no acute distress.  height is 5' 4.5" (1.638 m) and weight is 188 lb (85.3 kg). Her oral temperature is 98.5 F (36.9 C). Her blood pressure is 131/72 and her pulse is 85. Her oxygen saturation is 97%.  The patient exhibited  no palpable supraclavicular or axillary adenopathy. Her lungs are clear upon auscultation and the heart has a regular rhythm and rate. The abdomen is soft and nontender with normal bowel sounds.  On pelvic examination the external genitalia were unremarkable. A speculum exam was performed. There are no mucosal lesions noted in the vaginal vault. Erythematous changes in the proximal vagina along the left vaginal cuff region consistent with radiation changes.. On bimanual and rectovaginal examination there were no pelvic masses appreciated.  Lab Findings: Lab Results  Component Value Date   WBC 6.9 12/29/2015   HGB 12.9 12/29/2015   HCT 39.4 12/29/2015   MCV 86.4 12/29/2015   PLT 289.0 12/29/2015    Radiographic Findings: No results found.   Impression: No evidence of disease recurrence on clinical exam.  Plan: She will be scheduled for a CT scan in November 2017 and will follow up with Dr. Alycia Rossetti afterwards. She will follow up with radiation oncology in 1 year. A smaller vaginal dilator was provided for the patient. ____________________________________  Blair Promise, PhD, MD  This document serves as a record of services personally performed by Gery Pray, MD. It was created on his behalf by Darcus Austin, a trained medical scribe. The creation of this record is based on the scribe's personal observations and the provider's statements to them. This document has been checked and approved by the attending provider.

## 2016-08-31 ENCOUNTER — Telehealth: Payer: Self-pay | Admitting: Gynecologic Oncology

## 2016-08-31 ENCOUNTER — Ambulatory Visit (HOSPITAL_COMMUNITY)
Admission: RE | Admit: 2016-08-31 | Discharge: 2016-08-31 | Disposition: A | Discharge status: Home / Self Care | Payer: 59 | Source: Ambulatory Visit | Attending: Gynecologic Oncology | Admitting: Gynecologic Oncology

## 2016-08-31 ENCOUNTER — Encounter (HOSPITAL_COMMUNITY): Payer: Self-pay

## 2016-08-31 DIAGNOSIS — K449 Diaphragmatic hernia without obstruction or gangrene: Secondary | ICD-10-CM | POA: Insufficient documentation

## 2016-08-31 DIAGNOSIS — K760 Fatty (change of) liver, not elsewhere classified: Secondary | ICD-10-CM | POA: Insufficient documentation

## 2016-08-31 DIAGNOSIS — D3501 Benign neoplasm of right adrenal gland: Secondary | ICD-10-CM | POA: Insufficient documentation

## 2016-08-31 DIAGNOSIS — C541 Malignant neoplasm of endometrium: Secondary | ICD-10-CM | POA: Diagnosis present

## 2016-08-31 MED ORDER — IOPAMIDOL (ISOVUE-300) INJECTION 61%
100.0000 mL | Freq: Once | INTRAVENOUS | Status: AC | PRN
  Administered 2016-08-31: 100 mL via INTRAVENOUS

## 2016-08-31 NOTE — Telephone Encounter (Signed)
Spoke with patient about CT scan results.  Discussed results in detail.  Patient stating sometimes when she eats certain foods she "feels like it goes down then gets stuck but eventually moves."  Advised to call our office if symptoms worsen or persist or if she would like a referral to meet with a gastroenterologist or general surgeon about the hernia.  All questions answered.  Advised to call for any needs or concerns.

## 2016-09-22 ENCOUNTER — Telehealth: Payer: Self-pay | Admitting: Family Medicine

## 2016-09-22 MED ORDER — ATORVASTATIN CALCIUM 10 MG PO TABS: ORAL_TABLET | ORAL | 1 refills | Status: DC

## 2016-09-22 NOTE — Telephone Encounter (Signed)
Rx sent and pt notified.

## 2016-09-22 NOTE — Telephone Encounter (Signed)
Pt requesting call from [place] regarding transferring [facility].  Best number to call [phone]

## 2016-10-18 ENCOUNTER — Ambulatory Visit: Payer: BLUE CROSS/BLUE SHIELD | Attending: Gynecologic Oncology | Admitting: Gynecologic Oncology

## 2016-10-18 ENCOUNTER — Encounter: Payer: Self-pay | Admitting: Gynecologic Oncology

## 2016-10-18 VITALS — BP 131/79 | HR 80 | Temp 97.8°F | Resp 18 | Ht 64.5 in | Wt 189.0 lb

## 2016-10-18 DIAGNOSIS — Z9221 Personal history of antineoplastic chemotherapy: Secondary | ICD-10-CM | POA: Diagnosis not present

## 2016-10-18 DIAGNOSIS — E785 Hyperlipidemia, unspecified: Secondary | ICD-10-CM | POA: Diagnosis not present

## 2016-10-18 DIAGNOSIS — Z9889 Other specified postprocedural states: Secondary | ICD-10-CM | POA: Insufficient documentation

## 2016-10-18 DIAGNOSIS — Z923 Personal history of irradiation: Secondary | ICD-10-CM | POA: Diagnosis not present

## 2016-10-18 DIAGNOSIS — Z90710 Acquired absence of both cervix and uterus: Secondary | ICD-10-CM | POA: Diagnosis not present

## 2016-10-18 DIAGNOSIS — K760 Fatty (change of) liver, not elsewhere classified: Secondary | ICD-10-CM | POA: Insufficient documentation

## 2016-10-18 DIAGNOSIS — G43909 Migraine, unspecified, not intractable, without status migrainosus: Secondary | ICD-10-CM | POA: Diagnosis not present

## 2016-10-18 DIAGNOSIS — Z833 Family history of diabetes mellitus: Secondary | ICD-10-CM | POA: Insufficient documentation

## 2016-10-18 DIAGNOSIS — Z79899 Other long term (current) drug therapy: Secondary | ICD-10-CM | POA: Insufficient documentation

## 2016-10-18 DIAGNOSIS — Z8542 Personal history of malignant neoplasm of other parts of uterus: Secondary | ICD-10-CM | POA: Diagnosis not present

## 2016-10-18 DIAGNOSIS — K449 Diaphragmatic hernia without obstruction or gangrene: Secondary | ICD-10-CM | POA: Diagnosis not present

## 2016-10-18 DIAGNOSIS — C541 Malignant neoplasm of endometrium: Secondary | ICD-10-CM | POA: Insufficient documentation

## 2016-10-18 DIAGNOSIS — Z841 Family history of disorders of kidney and ureter: Secondary | ICD-10-CM | POA: Diagnosis not present

## 2016-10-18 NOTE — Patient Instructions (Signed)
Follow up with Dr. Sondra Come in 3 months. That will be your 4-year anniversary. I will see you 6 months after that. Happy Holidays!

## 2016-10-18 NOTE — Progress Notes (Signed)
Consult Note: Gyn-Onc  Jane Ruiz 55 y.o. female  CC:  Chief Complaint  Patient presents with  . Endometrial cancer    Follow up    HPI:55 year old post-menopausal female who experienced vaginal bleeding beginning 11-28-12. She was seen in January by Dr Glori Bickers and sent to Dr Raliegh Ip.Fogleman. She had endometrial biopsy on 01-06-13 which showed endometroid adenocarcinoma with mucinous features; she had continuous spotting after that biopsy. She saw Dr Skeet Latch on 02-21-13 and was taken to total robotic hysterectomy/BSO/bilateral pelvic nodes, with frozen section showing grade 1 tumor with minimal myometrial involvement, such that paraaortic nodes were not evaluated. She was hospitalized just 1.5 days and required no pain medication after DC home.   Final pathology [id number]), however, showed invasive with a well differentiated component and a high grade serous component. total size was 4.2 cm and invasion 0.3 cm where myometrium 1.8 cm thick; the serous component had LVSI and 3 of 7 pelvic nodes were involved with the serous component.   CT CAP on 02-25-13 had no findings of concern in the chest and no retroperitoneal adenopathy or other findings of concern for metastatic disease. Dr Sondra Come recommended IMRT after 3 cycles of chemotherapy and vaginal vault brachytherapy during the final 3 cycles of chemotherapy. Patient had 3 cycles of q 3 week taxol carbo from 03-15-13 thru 04-26-13, with neulasta day 2 and additional IVF, then external beam RT completed 07-01-13; chemo resumed with cycle 4 taxol carboplatin on 07-08-13, with HDR treatments x3 from 8-11 thru 07-30-13. Cycle 6 was delayed x 1 week due to thrombocytopenia, given on 08-26-13, with neulasta.   She had a CT scan performed on October 07, 2014 that revealed:  There is no focal abnormality in the liver or spleen. Small hiatal hernia is stable. Stomach is decompressed. The duodenum, pancreas, gallbladder, and left adrenal gland are unremarkable. 13 mm  right adrenal gland is stable. Tiny low-density lesion in the lower pole the right kidney Stable. Left kidney is unremarkable. No evidence for hydronephrosis or hydroureter on either side. No abdominal aortic aneurysm. There is no lymphadenopathy in the abdomen. No free abdominal fluid. Imaging through the pelvis shows no free intraperitoneal fluid. No pelvic sidewall lymphadenopathy. The small fluid collection seen along the left external iliac vasculature previously has resolved in the interval.   The terminal ileum and the appendix are normal. The proximal and mid sigmoid segments of the colon are position in the central anatomic pelvis in show some mild inner wall enhancement the lumen in this portion of the colon is relatively fixed in diameter compared to other areas of the colon. No definite evidence for stricture although the rectum is decompressed. Uterus and ovaries are surgically absent. Bone windows reveal no worrisome lytic or sclerotic osseous lesions.   IMPRESSION:  No evidence for lymphadenopathy or metastatic disease. The small fluid collection along the left pelvic sidewall seen previously has resolved in the interval. Fixed diameter of the proximal and mid sigmoid colon with slight hyper enhancement of the wall. This may reflect a degree of radiation change there is no evidence for overt distal colonic stricture at this time although the lumen does appear to be diffusely narrowed.   Stable 13 mm right adrenal nodule. Washout characteristics are compatible with benign adrenal adenoma.  Follow up CT on 10/28/15: IMPRESSION: 1. No evidence of local tumor recurrence at the vaginal cuff. 2. No evidence of metastatic disease in the chest, abdomen or pelvis. 3. Nonspecific diffuse bladder wall thickening,  which could represent chronic post radiation change, although correlation with urinalysis should be considered to exclude acute cystitis. 4. Stable right adrenal adenoma. 5. Small to  moderate hiatal hernia.  Interval history: She was seen by Dr. Sondra Come In August 2017 at which time her exam was unremarkable with the exception of some erythema on the proximal vagina along the left vaginal cuff consistent with radiation changes. I last saw her in May at which time her exam was unremarkable. She had a negative Pap smear in March 2017.   Her last CT scan was 08/31/2016. It revealed: IMPRESSION: No evidence of recurrent or metastatic carcinoma within the abdomen or pelvis. No other acute findings identified.  Stable small to moderate hiatal hernia, mild hepatic steatosis, and benign right adrenal adenoma.  She's overall doing quite well and really denies any complaints. She got her flu shot in October. She is due for her mammogram in January. There are no new medical problems in the family. She has not had any other issues with vaginal bleeding using her dilator since her initial episodes. She is ready for the holidays and is looking forward to spending them with her friends and family. She otherwise has no complaints today.  Review of Systems  Constitutional: Denies fever. Cardiovascular: No chest pain, shortness of breath, or edema  Pulmonary: No cough  Gastro Intestinal: No nausea, vomiting, constipation, or diarrhea reported, though she intermittently has some loose stools.. No change in bowel movement.  Genitourinary: Denies vaginal bleeding and discharge.  Neurologic: No weakness  Current Meds:  Outpatient Encounter Prescriptions as of 10/18/2016  Medication Sig  . atorvastatin (LIPITOR) 10 MG tablet TAKE 1 TABLET (10 MG TOTAL) BY MOUTH EVERY EVENING.  . calcium carbonate (TUMS EX) 750 MG chewable tablet Chew 2 tablets by mouth 2 (two) times daily.  . Multiple Vitamin (MULTIVITAMIN) tablet Take 1 tablet by mouth daily.   . Omega-3 Fatty Acids (FISH OIL) 1200 MG CAPS Take 1 capsule by mouth daily.    No facility-administered encounter medications on file as of  10/18/2016.     Allergy: No Known Allergies  Social Hx:   Social History   Social History  . Marital status: Married    Spouse name: N/A  . Number of children: N/A  . Years of education: N/A   Occupational History  . Not on file.   Social History Main Topics  . Smoking status: Never Smoker  . Smokeless tobacco: Never Used  . Alcohol use No  . Drug use: No  . Sexual activity: Yes     Comment: menarche age 40, G6,  P69, menopause 2006   Other Topics Concern  . Not on file   Social History Narrative  . No narrative on file    Past Surgical Hx:  Past Surgical History:  Procedure Laterality Date  . ABDOMINAL HYSTERECTOMY  02/12/13  . RHINOPLASTY     breathing problems- Dr Ernesto Rutherford  . ROBOTIC ASSISTED TOTAL HYSTERECTOMY WITH BILATERAL SALPINGO OOPHERECTOMY N/A 02/12/2013   Procedure: ROBOTIC ASSISTED TOTAL HYSTERECTOMY WITH BILATERAL SALPINGO OOPHORECTOMY,  LYMPH NODES;  Surgeon: Imagene Gurney A. Alycia Rossetti, MD;  Location: [facility];  Service: Gynecology;  Laterality: N/A;    Past Medical Hx:  Past Medical History:  Diagnosis Date  . endometrial ca 02/12/13   endometrial, positive nodes  . History of radiation therapy 05/27/2013-07/01/2013   pelvis 45 gray in 25 fractions, 63 gray with high dose brachytherapy  . Hyperlipidemia    borderline high cholesterol  .  Migraine   . Skin lesion    non maligant skin lesions on head    Oncology Hx:    Endometrial cancer ([place])   01/07/2013 Initial Diagnosis    Endometrial cancer      02/12/2013 Surgery    IIIC1 endometrioid grade 3      03/15/2013 - 08/26/2013 Chemotherapy    Completed 6 cycles of paclitaxel and carboplatin with sandwich radiation therapy (completed 07/01/13). First thee cycles of chemotherapy 03/15/13-04/26/13 then last three cycles of chemotherapy 07/08/13 thru 08/26/13.       Family Hx:  Family History  Problem Relation Age of Onset  . Diabetes Mother   . Kidney disease Mother   . Benign prostatic hyperplasia Father    . Diabetes Maternal Grandmother     Vitals:  Blood pressure 131/79, pulse 80, temperature 97.8 F (36.6 C), temperature source Oral, resp. rate 18, height 5' 4.5" (1.638 m), weight 189 lb (85.7 kg), SpO2 99 %.  Physical Exam: Well-nourished well-developed female in no acute distress.   Neck: Supple, no lymphadenopathy, no thyromegaly.  Lungs: Clear to auscultation bilaterally.  Cardiovascular: Regular rate and rhythm  Abdomen: Well-healed laparoscopic incisions. Abdomen is soft, nontender, nondistended. There are no palpable masses. There is no hepatosplenomegaly. No appreciable hernias.  Groins: No lymphadenopathy.  Extremity: No edema.  Pelvic: Normal external female genitalia. Vagina is atrophic. Vaginal cuff is visualized there is a small partially 5 mm area of erythema right side of the vaginal cuff it is flat and not polypoid. There are no other visible lesions.  Bimanual examination reveals no masses or nodularity. Rectal confirms.  Assessment/Plan: 55 year-old with III C1 endometrial carcinoma. Serous histology. She's completed chemotherapy and radiation under "sandwich" fashion. Her last cycle of chemotherapy was in September 2014.   Her exam today is unremarkable, she will follow-up with Dr. Sondra Come In 3 months. At that time she'll be at her 4 year anniversary and her visits can be spread out to every 6 months. She will need a Pap smear when she returns to see Dr. Sondra Come as that we'll be approximately one year since her prior Pap smear. She will see me in 9 months.   Roni Scow A., MD 10/18/2016, 12:05 PM

## 2016-10-19 ENCOUNTER — Ambulatory Visit (HOSPITAL_COMMUNITY): Payer: 59

## 2016-10-20 ENCOUNTER — Telehealth: Payer: Self-pay | Admitting: Gynecologic Oncology

## 2016-10-20 NOTE — Telephone Encounter (Signed)
Returned call to patient about appt with Kinard.  She was supposed to see Kinard in Feb 2018 but was set up for August.  [place] office and got her an appt on Feb 1 at 10:30am and called patient with that info.  Advised her to please call for any questions or concerns.

## 2016-10-26 ENCOUNTER — Ambulatory Visit: Payer: 59 | Admitting: Gynecologic Oncology

## 2016-10-28 DIAGNOSIS — R350 Frequency of micturition: Secondary | ICD-10-CM | POA: Diagnosis not present

## 2016-10-28 DIAGNOSIS — N3001 Acute cystitis with hematuria: Secondary | ICD-10-CM | POA: Diagnosis not present

## 2016-12-02 ENCOUNTER — Other Ambulatory Visit: Payer: Self-pay | Admitting: Nurse Practitioner

## 2017-01-05 ENCOUNTER — Encounter: Payer: Self-pay | Admitting: Radiation Oncology

## 2017-01-05 ENCOUNTER — Ambulatory Visit
Admission: RE | Admit: 2017-01-05 | Discharge: 2017-01-05 | Disposition: A | Discharge status: Home / Self Care | Payer: BLUE CROSS/BLUE SHIELD | Source: Ambulatory Visit | Attending: Radiation Oncology | Admitting: Radiation Oncology

## 2017-01-05 ENCOUNTER — Other Ambulatory Visit (HOSPITAL_COMMUNITY)
Admission: RE | Admit: 2017-01-05 | Discharge: 2017-01-05 | Disposition: A | Discharge status: Home / Self Care | Payer: BLUE CROSS/BLUE SHIELD | Source: Ambulatory Visit | Attending: Radiation Oncology | Admitting: Radiation Oncology

## 2017-01-05 DIAGNOSIS — Z08 Encounter for follow-up examination after completed treatment for malignant neoplasm: Secondary | ICD-10-CM | POA: Diagnosis not present

## 2017-01-05 DIAGNOSIS — Z8542 Personal history of malignant neoplasm of other parts of uterus: Secondary | ICD-10-CM | POA: Diagnosis not present

## 2017-01-05 DIAGNOSIS — C541 Malignant neoplasm of endometrium: Secondary | ICD-10-CM | POA: Insufficient documentation

## 2017-01-05 DIAGNOSIS — Z923 Personal history of irradiation: Secondary | ICD-10-CM | POA: Diagnosis not present

## 2017-01-05 DIAGNOSIS — C540 Malignant neoplasm of isthmus uteri: Secondary | ICD-10-CM | POA: Diagnosis not present

## 2017-01-05 DIAGNOSIS — Z01411 Encounter for gynecological examination (general) (routine) with abnormal findings: Secondary | ICD-10-CM | POA: Insufficient documentation

## 2017-01-05 DIAGNOSIS — Z79899 Other long term (current) drug therapy: Secondary | ICD-10-CM | POA: Insufficient documentation

## 2017-01-05 NOTE — Progress Notes (Addendum)
Jane Ruiz is here for follow up.  She denies having any pain, bladder/bowel issues or vaginal bleeding.  She reports having a good energy level.  She is using a dilator.  BP (!) 143/72 (BP Location: Left Arm, Patient Position: Sitting)   Pulse 76   Temp 98.3 F (36.8 C)   Ht 5' 4.5" (1.638 m)   Wt 192 lb 3.2 oz (87.2 kg)   SpO2 97%   BMI 32.48 kg/m    Wt Readings from Last 3 Encounters:  01/05/17 192 lb 3.2 oz (87.2 kg)  10/18/16 189 lb (85.7 kg)  07/07/16 188 lb (85.3 kg)

## 2017-01-05 NOTE — Progress Notes (Signed)
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 07/15/13, 07/22/13, 07/30/13 HDR treatments: Pelvis 45 gray in 25 fractions,  the proximal vagina was boosted further to 63 gray via high dose rate intracavitary brachytherapy treatments with Iridium 192 as the HDR source (3 cm diameter cylinder, 3 cm treatment length).  Narrative:  Jane Ruiz here for follow up.She denies having any pain, bladder/bowel issues, or vaginal bleeding. She reports having a good energy level. She is using a dilator.  ALLERGIES:  has No Known Allergies.  Meds: Current Outpatient Prescriptions  Medication Sig Dispense Refill  . atorvastatin (LIPITOR) 10 MG tablet TAKE 1 TABLET (10 MG TOTAL) BY MOUTH EVERY EVENING. 90 tablet 1  . calcium carbonate (TUMS EX) 750 MG chewable tablet Chew 2 tablets by mouth 2 (two) times daily.    . Multiple Vitamin (MULTIVITAMIN) tablet Take 1 tablet by mouth daily.     . Omega-3 Fatty Acids (FISH OIL) 1200 MG CAPS Take 1 capsule by mouth daily.      No current facility-administered medications for this encounter.     Physical Findings:, Patient is alert and oriented x3 and is in no acute distress.  height is 5' 4.5" (1.638 m) and weight is 192 lb 3.2 oz (87.2 kg). Her temperature is 98.3 F (36.8 C). Her blood pressure is 143/72 (abnormal) and her pulse is 76. Her oxygen saturation is 97%.  The patient exhibited no palpable supraclavicular or axillary adenopathy. Her lungs are clear upon auscultation and the heart has a regular rhythm and rate. The abdomen is soft and nontender with normal bowel sounds.  On pelvic examination the external genitalia were  unremarkable. A speculum exam was performed. There are no mucosal lesions noted in the vaginal vault. Erythematous changes in the proximal vagina along the left vaginal cuff region consistent with radiation changes.A pap smear was obtained of the vaginal cuff region. On bimanual and rectovaginal examination there were no pelvic masses appreciated.   Lab Findings: Lab Results  Component Value Date   WBC 6.9 12/29/2015   HGB 12.9 12/29/2015   HCT 39.4 12/29/2015   MCV 86.4 12/29/2015   PLT 289.0 12/29/2015    Radiographic Findings: No results found.   Impression: No evidence of disease recurrence on clinical exam, pap smear pending.  Plan: Follow up with radiation oncology in August. Follow up with Dr. Alycia Rossetti in May. ____________________________________  Blair Promise, PhD, MD  This document serves as a record of services personally performed by Gery Pray, MD. It was created on his behalf by Bethann Humble, a trained medical scribe. The creation of this record is based on the scribe's personal observations and the provider's statements to them. This document has been checked and approved by the attending provider.

## 2017-01-10 LAB — CYTOLOGY - PAP: DIAGNOSIS: NEGATIVE

## 2017-01-12 ENCOUNTER — Telehealth: Payer: Self-pay | Admitting: Oncology

## 2017-01-12 NOTE — Telephone Encounter (Signed)
Called Jane Ruiz and advised her of the good results from her pap smear per Dr. Sondra Come.  Jane Ruiz verbalized understanding and agreement.

## 2017-02-24 ENCOUNTER — Encounter: Payer: 59 | Admitting: Family Medicine

## 2017-02-24 ENCOUNTER — Other Ambulatory Visit: Payer: 59

## 2017-02-26 ENCOUNTER — Telehealth: Payer: Self-pay | Admitting: Family Medicine

## 2017-02-26 DIAGNOSIS — Z0000 Encounter for general adult medical examination without abnormal findings: Secondary | ICD-10-CM

## 2017-02-26 NOTE — Telephone Encounter (Signed)
-----   Message from Ellamae Sia sent at 02/24/2017 11:24 AM EDT ----- Regarding: Lab orders for Monday April 2, 18 Patient is scheduled for CPX labs, please order future labs, Thanks , Karna Christmas

## 2017-02-28 ENCOUNTER — Other Ambulatory Visit: Payer: Self-pay | Admitting: Family Medicine

## 2017-03-03 ENCOUNTER — Encounter: Payer: 59 | Admitting: Family Medicine

## 2017-03-06 ENCOUNTER — Other Ambulatory Visit (INDEPENDENT_AMBULATORY_CARE_PROVIDER_SITE_OTHER): Payer: BLUE CROSS/BLUE SHIELD

## 2017-03-06 DIAGNOSIS — Z0000 Encounter for general adult medical examination without abnormal findings: Secondary | ICD-10-CM

## 2017-03-06 LAB — COMPREHENSIVE METABOLIC PANEL
ALBUMIN: 4 g/dL (ref 3.5–5.2)
ALK PHOS: 75 U/L (ref 39–117)
ALT: 14 U/L (ref 0–35)
AST: 14 U/L (ref 0–37)
BUN: 11 mg/dL (ref 6–23)
CALCIUM: 9.6 mg/dL (ref 8.4–10.5)
CHLORIDE: 106 meq/L (ref 96–112)
CO2: 27 mEq/L (ref 19–32)
CREATININE: 0.8 mg/dL (ref 0.40–1.20)
GFR: 78.24 mL/min (ref >60.00)
Glucose, Bld: 91 mg/dL (ref 70–99)
POTASSIUM: 3.9 meq/L (ref 3.5–5.1)
SODIUM: 141 meq/L (ref 135–145)
TOTAL PROTEIN: 7 g/dL (ref 6.0–8.3)
Total Bilirubin: 0.4 mg/dL (ref 0.2–1.2)

## 2017-03-06 LAB — CBC WITH DIFFERENTIAL/PLATELET
BASOS PCT: 0.5 % (ref 0.0–3.0)
Basophils Absolute: 0 10*3/uL (ref 0.0–0.1)
Eosinophils Absolute: 0.2 10*3/uL (ref 0.0–0.7)
Eosinophils Relative: 2.8 % (ref 0.0–5.0)
HCT: 38.7 % (ref 36.0–46.0)
HEMOGLOBIN: 12.8 g/dL (ref 12.0–15.0)
LYMPHS PCT: 14.8 % (ref 12.0–46.0)
Lymphs Abs: 1.2 10*3/uL (ref 0.7–4.0)
MCHC: 33.1 g/dL (ref 30.0–36.0)
MCV: 85.5 fl (ref 78.0–100.0)
Monocytes Absolute: 0.7 10*3/uL (ref 0.1–1.0)
Monocytes Relative: 8.1 % (ref 3.0–12.0)
Neutro Abs: 6 10*3/uL (ref 1.4–7.7)
Neutrophils Relative %: 73.8 % (ref 43.0–77.0)
Platelets: 310 10*3/uL (ref 150.0–400.0)
RBC: 4.53 Mil/uL (ref 3.87–5.11)
RDW: 14.1 % (ref 11.5–15.5)
WBC: 8.1 10*3/uL (ref 4.0–10.5)

## 2017-03-06 LAB — LIPID PANEL
CHOLESTEROL: 167 mg/dL (ref 0–200)
HDL: 35.1 mg/dL — ABNORMAL LOW (ref >39.00)
LDL CALC: 103 mg/dL — AB (ref 0–99)
NonHDL: 131.67
TRIGLYCERIDES: 142 mg/dL (ref 0.0–149.0)
Total CHOL/HDL Ratio: 5
VLDL: 28.4 mg/dL (ref 0.0–40.0)

## 2017-03-06 LAB — TSH: TSH: 4.19 u[IU]/mL (ref 0.35–4.50)

## 2017-03-13 ENCOUNTER — Ambulatory Visit (INDEPENDENT_AMBULATORY_CARE_PROVIDER_SITE_OTHER): Payer: BLUE CROSS/BLUE SHIELD | Admitting: Family Medicine

## 2017-03-13 ENCOUNTER — Encounter: Payer: Self-pay | Admitting: Family Medicine

## 2017-03-13 VITALS — BP 130/78 | HR 77 | Temp 98.5°F | Ht 64.0 in | Wt 190.8 lb

## 2017-03-13 DIAGNOSIS — C541 Malignant neoplasm of endometrium: Secondary | ICD-10-CM | POA: Diagnosis not present

## 2017-03-13 DIAGNOSIS — Z0000 Encounter for general adult medical examination without abnormal findings: Secondary | ICD-10-CM | POA: Diagnosis not present

## 2017-03-13 DIAGNOSIS — E7800 Pure hypercholesterolemia, unspecified: Secondary | ICD-10-CM

## 2017-03-13 DIAGNOSIS — E6609 Other obesity due to excess calories: Secondary | ICD-10-CM | POA: Diagnosis not present

## 2017-03-13 DIAGNOSIS — Z6832 Body mass index (BMI) 32.0-32.9, adult: Secondary | ICD-10-CM | POA: Diagnosis not present

## 2017-03-13 MED ORDER — ATORVASTATIN CALCIUM 10 MG PO TABS: 10.0000 mg | ORAL_TABLET | Freq: Every evening | ORAL | 3 refills | Status: DC

## 2017-03-13 NOTE — Assessment & Plan Note (Signed)
She continues oncology f/u Q6 mo  May be released in the next year  Doing well

## 2017-03-13 NOTE — Assessment & Plan Note (Signed)
Disc goals for lipids and reasons to control them Rev labs with pt Rev low sat fat diet in detail On statin and diet  HDL low-disc role of exercise

## 2017-03-13 NOTE — Assessment & Plan Note (Signed)
Discussed how this problem influences overall health and the risks it imposes  Reviewed plan for weight loss with lower calorie diet (via better food choices and also portion control or program like weight watchers) and exercise building up to or more than 30 minutes 5 days per week including some aerobic activity    

## 2017-03-13 NOTE — Progress Notes (Signed)
Subjective:    Patient ID: Jane Ruiz, female    DOB: [birth year], 56 y.o.   MRN: [id number]  HPI Here for health maintenance exam and to review chronic medical problems    Has been feeling good overall   Wt Readings from Last 3 Encounters:  03/13/17 190 lb 12 oz (86.5 kg)  01/05/17 192 lb 3.2 oz (87.2 kg)  10/18/16 189 lb (85.7 kg)  bmi is 32.7 Down 2 lb  Trying to cut back/ eat right (eating more baked food instead of fried and more vegetables)  Eating less sweets Lora Havens  Likes to keep moving - has a wt bench   Mammogram 6/17 negative  Self breast exam -no breast lumps or changes    Pap/gyn care 2/18 (normal vaginal pap from gyn) Hx of endometrial cancer 3/14 with total hysterectomy Still goes to the cancer center every 6 mo  May release her soon    Zoster vaccine - unsure if she will want- will think about it  Flu vaccine - she did get one in oct  Tetanus vaccine 12/09  Colonoscopy/ screening 7/10 hyperplastic polyp -10 y recall  2/15 ifob neg  dexa - has not had one yet- was not recommended by oncology No fractures  She takes mvi -but no ca and D  Hx of hyperlipidemia Lab Results  Component Value Date   CHOL 167 03/06/2017   CHOL 163 12/29/2015   CHOL 179 12/23/2014   Lab Results  Component Value Date   HDL 35.10 (L) 03/06/2017   HDL 36.70 (L) 12/29/2015   HDL 34.50 (L) 12/23/2014   Lab Results  Component Value Date   LDLCALC 103 (H) 03/06/2017   LDLCALC 94 12/29/2015   LDLCALC 111 (H) 12/23/2014   Lab Results  Component Value Date   TRIG 142.0 03/06/2017   TRIG 161.0 (H) 12/29/2015   TRIG 169.0 (H) 12/23/2014   Lab Results  Component Value Date   CHOLHDL 5 03/06/2017   CHOLHDL 4 12/29/2015   CHOLHDL 5 12/23/2014   Lab Results  Component Value Date   LDLDIRECT 172.4 12/24/2010   LDLDIRECT 174.9 09/17/2010   LDLDIRECT 164.2 07/06/2010   Atorvastatin 10 and diet  Fairly well controlled - but HDL is low as usual   Results for  orders placed or performed in visit on 03/06/17  CBC with Differential/Platelet  Result Value Ref Range   WBC 8.1 4.0 - 10.5 K/uL   RBC 4.53 3.87 - 5.11 Mil/uL   Hemoglobin 12.8 12.0 - 15.0 g/dL   HCT 38.7 36.0 - 46.0 %   MCV 85.5 78.0 - 100.0 fl   MCHC 33.1 30.0 - 36.0 g/dL   RDW 14.1 11.5 - 15.5 %   Platelets 310.0 150.0 - 400.0 K/uL   Neutrophils Relative % 73.8 43.0 - 77.0 %   Lymphocytes Relative 14.8 12.0 - 46.0 %   Monocytes Relative 8.1 3.0 - 12.0 %   Eosinophils Relative 2.8 0.0 - 5.0 %   Basophils Relative 0.5 0.0 - 3.0 %   Neutro Abs 6.0 1.4 - 7.7 K/uL   Lymphs Abs 1.2 0.7 - 4.0 K/uL   Monocytes Absolute 0.7 0.1 - 1.0 K/uL   Eosinophils Absolute 0.2 0.0 - 0.7 K/uL   Basophils Absolute 0.0 0.0 - 0.1 K/uL  Comprehensive metabolic panel  Result Value Ref Range   Sodium 141 135 - 145 mEq/L   Potassium 3.9 3.5 - 5.1 mEq/L   Chloride 106 96 - 112 mEq/L  CO2 27 19 - 32 mEq/L   Glucose, Bld 91 70 - 99 mg/dL   BUN 11 6 - 23 mg/dL   Creatinine, Ser 0.80 0.40 - 1.20 mg/dL   Total Bilirubin 0.4 0.2 - 1.2 mg/dL   Alkaline Phosphatase 75 39 - 117 U/L   AST 14 0 - 37 U/L   ALT 14 0 - 35 U/L   Total Protein 7.0 6.0 - 8.3 g/dL   Albumin 4.0 3.5 - 5.2 g/dL   Calcium 9.6 8.4 - 10.5 mg/dL   GFR 78.24 >60.00 mL/min  Lipid panel  Result Value Ref Range   Cholesterol 167 0 - 200 mg/dL   Triglycerides 142.0 0.0 - 149.0 mg/dL   HDL 35.10 (L) >39.00 mg/dL   VLDL 28.4 0.0 - 40.0 mg/dL   LDL Cholesterol 103 (H) 0 - 99 mg/dL   Total CHOL/HDL Ratio 5    NonHDL 131.67   TSH  Result Value Ref Range   TSH 4.19 0.35 - 4.50 uIU/mL      Patient Active Problem List   Diagnosis Date Noted  . Endometrial cancer ([place]) 02/12/2013  . Other screening mammogram 11/04/2011  . Obesity 11/04/2011  . Routine general medical examination at a health care facility 10/20/2011  . PLANTAR FASCIITIS 12/31/2010  . Hyperlipidemia 09/24/2010  . MENOPAUSAL SYNDROME 07/06/2010   Past Medical History:    Diagnosis Date  . endometrial ca 02/12/13   endometrial, positive nodes  . History of radiation therapy 05/27/2013-07/01/2013   pelvis 45 gray in 25 fractions, 63 gray with high dose brachytherapy  . Hyperlipidemia    borderline high cholesterol  . Migraine   . Skin lesion    non maligant skin lesions on head   Past Surgical History:  Procedure Laterality Date  . ABDOMINAL HYSTERECTOMY  02/12/13  . RHINOPLASTY     breathing problems- Dr Ernesto Rutherford  . ROBOTIC ASSISTED TOTAL HYSTERECTOMY WITH BILATERAL SALPINGO OOPHERECTOMY N/A 02/12/2013   Procedure: ROBOTIC ASSISTED TOTAL HYSTERECTOMY WITH BILATERAL SALPINGO OOPHORECTOMY,  LYMPH NODES;  Surgeon: Imagene Gurney A. Alycia Rossetti, MD;  Location: [facility];  Service: Gynecology;  Laterality: N/A;   Social History  Substance Use Topics  . Smoking status: Never Smoker  . Smokeless tobacco: Never Used  . Alcohol use No   Family History  Problem Relation Age of Onset  . Diabetes Mother   . Kidney disease Mother   . Benign prostatic hyperplasia Father   . Diabetes Maternal Grandmother    No Known Allergies Current Outpatient Prescriptions on File Prior to Visit  Medication Sig Dispense Refill  . calcium carbonate (TUMS EX) 750 MG chewable tablet Chew 2 tablets by mouth 2 (two) times daily.    . Multiple Vitamin (MULTIVITAMIN) tablet Take 1 tablet by mouth daily.      No current facility-administered medications on file prior to visit.     Review of Systems    Review of Systems  Constitutional: Negative for fever, appetite change, fatigue and unexpected weight change.  Eyes: Negative for pain and visual disturbance.  Respiratory: Negative for cough and shortness of breath.   Cardiovascular: Negative for cp or palpitations    Gastrointestinal: Negative for nausea, diarrhea and constipation.  Genitourinary: Negative for urgency and frequency.  Skin: Negative for pallor or rash   Neurological: Negative for weakness, light-headedness, numbness and  headaches.  Hematological: Negative for adenopathy. Does not bruise/bleed easily.  Psychiatric/Behavioral: Negative for dysphoric mood. The patient is not nervous/anxious.      Objective:  Physical Exam  Constitutional: She appears well-developed and well-nourished. No distress.  obese and well appearing   HENT:  Head: Normocephalic and atraumatic.  Right Ear: External ear normal.  Left Ear: External ear normal.  Mouth/Throat: Oropharynx is clear and moist.  Eyes: Conjunctivae and EOM are normal. Pupils are equal, round, and reactive to light. No scleral icterus.  Neck: Normal range of motion. Neck supple. No JVD present. Carotid bruit is not present. No thyromegaly present.  Cardiovascular: Normal rate, regular rhythm, normal heart sounds and intact distal pulses.  Exam reveals no gallop.   Pulmonary/Chest: Effort normal and breath sounds normal. No respiratory distress. She has no wheezes. She exhibits no tenderness.  Abdominal: Soft. Bowel sounds are normal. She exhibits no distension, no abdominal bruit and no mass. There is no tenderness.  Genitourinary: No breast swelling, tenderness, discharge or bleeding.  Genitourinary Comments: Breast exam: No mass, nodules, thickening, tenderness, bulging, retraction, inflamation, nipple discharge or skin changes noted.  No axillary or clavicular LA.      Musculoskeletal: Normal range of motion. She exhibits no edema or tenderness.  Lymphadenopathy:    She has no cervical adenopathy.  Neurological: She is alert. She has normal reflexes. No cranial nerve deficit. She exhibits normal muscle tone. Coordination normal.  Skin: Skin is warm and dry. No rash noted. No erythema. No pallor.  Scattered sks and lentigines  Psychiatric: She has a normal mood and affect.          Assessment & Plan:   Problem List Items Addressed This Visit      Genitourinary   Endometrial cancer ([place]) - Primary    She continues oncology f/u Q6 mo  May be  released in the next year  Doing well         Other   Hyperlipidemia    Disc goals for lipids and reasons to control them Rev labs with pt Rev low sat fat diet in detail On statin and diet  HDL low-disc role of exercise       Relevant Medications   atorvastatin (LIPITOR) 10 MG tablet   Obesity    Discussed how this problem influences overall health and the risks it imposes  Reviewed plan for weight loss with lower calorie diet (via better food choices and also portion control or program like weight watchers) and exercise building up to or more than 30 minutes 5 days per week including some aerobic activity         Routine general medical examination at a health care facility    Reviewed health habits including diet and exercise and skin cancer prevention Reviewed appropriate screening tests for age  Also reviewed health mt list, fam hx and immunization status , as well as social and family history   See HPI Labs reviewed  utd gyn care in setting of past endomet ca She will look into coverage ofr zoster vaccine  Reminded to get yearly flu vaccine  Disc inc exercise for wt control and low HDL

## 2017-03-13 NOTE — Progress Notes (Signed)
Pre visit review using our clinic review tool, if applicable. No additional management support is needed unless otherwise documented below in the visit note. 

## 2017-03-13 NOTE — Assessment & Plan Note (Signed)
Reviewed health habits including diet and exercise and skin cancer prevention Reviewed appropriate screening tests for age  Also reviewed health mt list, fam hx and immunization status , as well as social and family history   See HPI Labs reviewed  utd gyn care in setting of past endomet ca She will look into coverage ofr zoster vaccine  Reminded to get yearly flu vaccine  Disc inc exercise for wt control and low HDL

## 2017-03-13 NOTE — Patient Instructions (Addendum)
If you are interested in a shingles/zoster vaccine - call your insurance to check on coverage,( you should not get it within 1 month of other vaccines) , then call us for a prescription  for it to take to a pharmacy that gives the shot , or make a nurse visit to get it here depending on your coverage  Try to get 1200-1500 mg of calcium per day with at least 1000 iu of vitamin D - for bone health   Aim for 30 or more extra minutes of exercise daily - important for general health and it may helps you loose weight   Take care of yourself

## 2017-05-22 ENCOUNTER — Telehealth: Payer: Self-pay | Admitting: Family Medicine

## 2017-05-22 NOTE — Telephone Encounter (Signed)
[facility]  Patient Name: Jane Ruiz  DOB: 1961-12-05    Initial Comment Caller has had problems swallowing    Nurse Assessment  Nurse: Wynetta Emery, RN, Baker Janus Date/Time Eilene Ghazi Time): 05/22/2017 10:40:04 AM  Confirm and document reason for call. If symptomatic, describe symptoms. ---Ermine is a Cancer survivor-- had CT Scan had a small hole in esophagus - dx hiatial hernia.  Does the patient have any new or worsening symptoms? ---Yes  Will a triage be completed? ---Yes  Related visit to physician within the last 2 weeks? ---No  Does the PT have any chronic conditions? (i.e. diabetes, asthma, etc.) ---Yes  List chronic conditions. ---Cancer surviver  Is this a behavioral health or substance abuse call? ---No     Guidelines    Guideline Title Affirmed Question Affirmed Notes  Swallowing Difficulty [1] Swallowing difficulty AND [2] cause unknown (Exception: difficulty swallowing is a chronic symptom)    Final Disposition User   See Physician within [place], RN, Baker Janus    Comments  NOTE appointments not found or available with PCP; given an appointment with Arnette Norris, 05-23-2017 1130am at [facility] location   Referrals  Roberta UNDECIDED   Disagree/Comply: Comply

## 2017-05-22 NOTE — Telephone Encounter (Signed)
Pt has appt 05/23/17 at 11:30 with Dr Deborra Medina.

## 2017-05-23 ENCOUNTER — Ambulatory Visit (INDEPENDENT_AMBULATORY_CARE_PROVIDER_SITE_OTHER): Payer: BLUE CROSS/BLUE SHIELD | Admitting: Family Medicine

## 2017-05-23 ENCOUNTER — Encounter: Payer: Self-pay | Admitting: Family Medicine

## 2017-05-23 ENCOUNTER — Encounter: Payer: Self-pay | Admitting: Internal Medicine

## 2017-05-23 DIAGNOSIS — R1310 Dysphagia, unspecified: Secondary | ICD-10-CM

## 2017-05-23 NOTE — Assessment & Plan Note (Signed)
Progressive. Refer to GI for diagnostic/therapeutic endoscopy.  Pt aware and agrees with plan.

## 2017-05-23 NOTE — Progress Notes (Signed)
Subjective:   Patient ID: Jane Ruiz, female    DOB: 19-Nov-1961, 56 y.o.   MRN: [id number]  KRISTIANA JACKO is a pleasant 56 y.o. year old female pt of Dr. Glori Bickers, new to me, who presents to clinic today with Dysphagia  on 05/23/2017  HPI:  Dysphagia-  Progressive feeling of food getting for months.  She is not a heavy drinker and is not a smoker.  Hx of endometrial cancer 3/14 with total hysterectomy (Dr. Alycia Rossetti, Dr. Sondra Come). Still goes to the cancer center every 6 months but may get released soon.  Lab Results  Component Value Date   TSH 4.19 03/06/2017    Current Outpatient Prescriptions on File Prior to Visit  Medication Sig Dispense Refill  . atorvastatin (LIPITOR) 10 MG tablet Take 1 tablet (10 mg total) by mouth every evening. 90 tablet 3  . calcium carbonate (TUMS EX) 750 MG chewable tablet Chew 2 tablets by mouth 2 (two) times daily.    . Multiple Vitamin (MULTIVITAMIN) tablet Take 1 tablet by mouth daily.      No current facility-administered medications on file prior to visit.     No Known Allergies  Past Medical History:  Diagnosis Date  . endometrial ca 02/12/13   endometrial, positive nodes  . History of radiation therapy 05/27/2013-07/01/2013   pelvis 45 gray in 25 fractions, 63 gray with high dose brachytherapy  . Hyperlipidemia    borderline high cholesterol  . Migraine   . Skin lesion    non maligant skin lesions on head    Past Surgical History:  Procedure Laterality Date  . ABDOMINAL HYSTERECTOMY  02/12/13  . RHINOPLASTY     breathing problems- Dr Ernesto Rutherford  . ROBOTIC ASSISTED TOTAL HYSTERECTOMY WITH BILATERAL SALPINGO OOPHERECTOMY N/A 02/12/2013   Procedure: ROBOTIC ASSISTED TOTAL HYSTERECTOMY WITH BILATERAL SALPINGO OOPHORECTOMY,  LYMPH NODES;  Surgeon: Imagene Gurney A. Alycia Rossetti, MD;  Location: [facility];  Service: Gynecology;  Laterality: N/A;    Family History  Problem Relation Age of Onset  . Diabetes Mother   . Kidney disease Mother   . Benign  prostatic hyperplasia Father   . Diabetes Maternal Grandmother     Social History   Social History  . Marital status: Married    Spouse name: N/A  . Number of children: N/A  . Years of education: N/A   Occupational History  . Not on file.   Social History Main Topics  . Smoking status: Never Smoker  . Smokeless tobacco: Never Used  . Alcohol use No  . Drug use: No  . Sexual activity: Yes     Comment: menarche age 54, G35,  P36, menopause 2006   Other Topics Concern  . Not on file   Social History Narrative  . No narrative on file   The PMH, PSH, Social History, Family History, Medications, and allergies have been reviewed in [facility], and have been updated if relevant.   Review of Systems  Constitutional: Positive for unexpected weight change. Negative for activity change, appetite change, chills, diaphoresis, fatigue and fever.  HENT: Positive for trouble swallowing. Negative for sore throat.   Respiratory: Negative.   Cardiovascular: Negative.   Gastrointestinal: Negative.   Endocrine: Negative.   Genitourinary: Negative.   Musculoskeletal: Negative.   Allergic/Immunologic: Negative.   Neurological: Negative.   Psychiatric/Behavioral: Negative.   All other systems reviewed and are negative.      Objective:    BP 112/86   Pulse 91   Ht 5'  4" (1.626 m)   Wt 188 lb 1.3 oz (85.3 kg)   SpO2 98%   BMI 32.28 kg/m   Wt Readings from Last 3 Encounters:  05/23/17 188 lb 1.3 oz (85.3 kg)  03/13/17 190 lb 12 oz (86.5 kg)  01/05/17 192 lb 3.2 oz (87.2 kg)     Physical Exam   General:  Well-developed,well-nourished,in no acute distress; alert,appropriate and cooperative throughout examination Head:  normocephalic and atraumatic.   Eyes:  vision grossly intact, PERRL Ears:  R ear normal and L ear normal externally, TMs clear bilaterally Nose:  no external deformity.   Mouth:  good dentition.   Neck:  No deformities, masses, or tenderness noted. Lungs:  Normal  respiratory effort, chest expands symmetrically. Lungs are clear to auscultation, no crackles or wheezes. Heart:  Normal rate and regular rhythm. S1 and S2 normal without gallop, murmur, click, rub or other extra sounds. Msk:  No deformity or scoliosis noted of thoracic or lumbar spine.   Extremities:  No clubbing, cyanosis, edema, or deformity noted with normal full range of motion of all joints.   Neurologic:  alert & oriented X3 and gait normal.   Skin:  Intact without suspicious lesions or rashes Psych:  Cognition and judgment appear intact. Alert and cooperative with normal attention span and concentration. No apparent delusions, illusions, hallucinations       Assessment & Plan:   Dysphagia, unspecified type - Plan: Ambulatory referral to Gastroenterology No Follow-up on file.

## 2017-05-23 NOTE — Patient Instructions (Signed)
Great to see you.  Call us back with who you want to se (GI doctors).

## 2017-07-13 ENCOUNTER — Ambulatory Visit
Admission: RE | Admit: 2017-07-13 | Discharge: 2017-07-13 | Disposition: A | Discharge status: Home / Self Care | Payer: BLUE CROSS/BLUE SHIELD | Source: Ambulatory Visit | Attending: Radiation Oncology | Admitting: Radiation Oncology

## 2017-07-13 ENCOUNTER — Encounter: Payer: Self-pay | Admitting: Radiation Oncology

## 2017-07-13 DIAGNOSIS — Z8542 Personal history of malignant neoplasm of other parts of uterus: Secondary | ICD-10-CM | POA: Diagnosis not present

## 2017-07-13 DIAGNOSIS — C541 Malignant neoplasm of endometrium: Secondary | ICD-10-CM | POA: Insufficient documentation

## 2017-07-13 DIAGNOSIS — Z08 Encounter for follow-up examination after completed treatment for malignant neoplasm: Secondary | ICD-10-CM | POA: Diagnosis not present

## 2017-07-13 NOTE — Progress Notes (Signed)
Jane Ruiz is here for follow up.  She denies having any pain, bladder/bowel issues or vaginal bleeding/discharge.  She denies having fatigue and has a good appetite.  She is not using a dilator.  BP (!) 141/84 (BP Location: Right Arm, Patient Position: Sitting)   Pulse 81   Temp 98.3 F (36.8 C) (Oral)   Ht 5\' 4"  (1.626 m)   Wt 187 lb 12.8 oz (85.2 kg)   SpO2 98%   BMI 32.24 kg/m    Wt Readings from Last 3 Encounters:  07/13/17 187 lb 12.8 oz (85.2 kg)  05/23/17 188 lb 1.3 oz (85.3 kg)  03/13/17 190 lb 12 oz (86.5 kg)

## 2017-07-13 NOTE — Progress Notes (Signed)
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  Narrative:  [name] here for follow up and reports that she is doing well overall. She notes that she hasn't followed up with Dr. Alycia Rossetti since her last visit. Of note, pt was last seen in the clinic (Radiation Oncology) on 01/05/17 where she had a pap completed that was negative for malignancy. She denies vaginal bleeding, vaginal cramping, abdominal cramping, diarrhea, or pain at this time. Pt denies using a dilator.    On review of systems, pt denies fever, chills, weight loss, decreased appetite, decreased energy levels. Denies pain. She denies vaginal bleeding, vaginal cramping, abdominal cramping, diarrhea.   ALLERGIES:  has No Known Allergies.  Meds: Current Outpatient Prescriptions  Medication Sig Dispense Refill  . atorvastatin (LIPITOR) 10 MG tablet Take 1 tablet (10 mg total) by mouth every evening. 90 tablet 3  . calcium carbonate (TUMS EX) 750 MG chewable tablet Chew 2 tablets by mouth 2 (two) times daily.    . cholecalciferol (VITAMIN D) 1000 units tablet Take 1,000 Units by mouth daily.    . Multiple Vitamin (MULTIVITAMIN) tablet Take 1 tablet by mouth daily.      No current facility-administered medications for this encounter.    REVIEW OF SYSTEMS: A 10+ POINT REVIEW OF SYSTEMS WAS OBTAINED including neurology, dermatology, psychiatry,  cardiac, respiratory, lymph, extremities, GI, GU, musculoskeletal, constitutional, reproductive, HEENT. All pertinent positives are noted in the HPI. All others are negative.  Physical Findings:, Patient is alert and oriented x3 and is in no acute distress.  height is 5\' 4"  (1.626 m) and weight is 187 lb 12.8 oz (85.2 kg). Her oral temperature is 98.3 F (36.8 C). Her blood pressure is 141/84 (abnormal) and her pulse is 81. Her oxygen saturation is 98%.  The patient exhibited no palpable supraclavicular or axillary adenopathy. Her lungs are clear upon auscultation and the heart has a regular rhythm and rate. The abdomen is soft and nontender with normal bowel sounds.  On pelvic examination the external genitalia were unremarkable. A speculum exam was performed. There are no mucosal lesions noted in the vaginal vault. Erythematous changes in the proximal vagina along the  vaginal cuff region consistent with radiation changes. On bimanual and rectovaginal examination there were no pelvic masses appreciated. Rectal Shpincter tone normal  Lab Findings: Lab Results  Component Value Date   WBC 8.1 03/06/2017   HGB 12.8 03/06/2017   HCT 38.7 03/06/2017   MCV 85.5 03/06/2017   PLT 310.0 03/06/2017    Radiographic Findings: No results found.   Impression: No evidence of disease recurrence on clinical exam. Pap smear not performed today in light of pap smear earlier this year.    Plan: Pt will see Dr. Alycia Rossetti in February of 2019.  She will follow up with Radiation Oncology in 1 year.     ____________________________________  Blair Promise,  PhD, MD  This document serves as a record of services personally performed by Gery Pray, MD. It was created on his behalf by Steva Colder, a trained medical scribe. The creation of this record is based on the scribe's personal observations and the provider's statements to them. This document has been checked and approved by the attending provider.

## 2017-07-18 ENCOUNTER — Ambulatory Visit (INDEPENDENT_AMBULATORY_CARE_PROVIDER_SITE_OTHER): Payer: BLUE CROSS/BLUE SHIELD | Admitting: Internal Medicine

## 2017-07-18 ENCOUNTER — Encounter: Payer: Self-pay | Admitting: Internal Medicine

## 2017-07-18 ENCOUNTER — Encounter (INDEPENDENT_AMBULATORY_CARE_PROVIDER_SITE_OTHER): Payer: Self-pay

## 2017-07-18 VITALS — BP 148/102 | HR 96 | Ht 64.0 in | Wt 187.8 lb

## 2017-07-18 DIAGNOSIS — R1310 Dysphagia, unspecified: Secondary | ICD-10-CM | POA: Diagnosis not present

## 2017-07-18 DIAGNOSIS — R1319 Other dysphagia: Secondary | ICD-10-CM

## 2017-07-18 DIAGNOSIS — K449 Diaphragmatic hernia without obstruction or gangrene: Secondary | ICD-10-CM | POA: Diagnosis not present

## 2017-07-18 NOTE — Patient Instructions (Signed)

## 2017-07-18 NOTE — Progress Notes (Signed)
Jane Ruiz 56 y.o. Oct 04, 1961 [id number] Referred by: Lucille Passy, MD for Dr. Loura Pardon  Assessment & Plan:   Encounter Diagnoses  Name Primary?  . Esophageal dysphagia Yes  . HH (hiatus hernia)     Chronic intermittent dysphagia without heartburn or reflux symptoms. Sounds like esophageal stricture or ring. Potentially related to her hiatal hernia. Upper endoscopy with likely esophageal dilation.The risks and benefits as well as alternatives of endoscopic procedure(s) have been discussed and reviewed. All questions answered. The patient agrees to proceed. Further plans pending endoscopy.   She does not need another routine colonoscopy until 2020. We will place a recall now that she is a patient here.  I appreciate the opportunity to care for this patient. CC: Tower, Wynelle Fanny, MD   Subjective:   Chief Complaint: Dysphagia  HPI Very nice 56 year old married white woman with several years of intermittent solid food dysphagia which may be getting worse. There is no associated heartburn or indigestion and no unintentional weight loss. Solid foods either take a while the past require water or regurgitated. It began probably in 2014 around the time she was diagnosed with endometrial carcinoma. She is in remission from that with no evidence of recurrent disease. CT scan does show a small to moderate hiatal hernia. GI history otherwise notable for a prior colonoscopy through Dr. Watt Climes at age 56, July 2010 hemorrhoids noted sigmoid hyperplastic polyps that were diminutive only.  She is here with her husband who is a patient of mine having had 2 colonoscopies through me.  No Known Allergies Current Meds  Medication Sig  . atorvastatin (LIPITOR) 10 MG tablet Take 1 tablet (10 mg total) by mouth every evening.  . calcium carbonate (TUMS EX) 750 MG chewable tablet Chew 2 tablets by mouth 2 (two) times daily.  . cholecalciferol (VITAMIN D) 1000 units tablet Take 1,000 Units by  mouth daily.  . Multiple Vitamin (MULTIVITAMIN) tablet Take 1 tablet by mouth daily.    Past Medical History:  Diagnosis Date  . endometrial ca 02/12/13   endometrial, positive nodes  . External hemorrhoids   . History of radiation therapy 05/27/2013-07/01/2013   pelvis 45 gray in 25 fractions, 63 gray with high dose brachytherapy  . Hyperlipidemia    borderline high cholesterol  . Hyperplastic colon polyp   . Internal hemorrhoids   . Migraine   . Skin lesion    non maligant skin lesions on head   Past Surgical History:  Procedure Laterality Date  . ABDOMINAL HYSTERECTOMY  02/12/13  . COLONOSCOPY W/ BIOPSIES  06/15/2009   Dr Watt Climes  . RHINOPLASTY     breathing problems- Dr Ernesto Rutherford  . ROBOTIC ASSISTED TOTAL HYSTERECTOMY WITH BILATERAL SALPINGO OOPHERECTOMY N/A 02/12/2013   Procedure: ROBOTIC ASSISTED TOTAL HYSTERECTOMY WITH BILATERAL SALPINGO OOPHORECTOMY,  LYMPH NODES;  Surgeon: Imagene Gurney A. Alycia Rossetti, MD;  Location: [facility];  Service: Gynecology;  Laterality: N/A;   Social History   Social History  . Marital status: Married    Spouse name: N/A  . Number of children: 1  . Years of education: N/A   Occupational History  . 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 Smoking status: Never Smoker  . Smokeless tobacco: Never Used  . Alcohol use No  . Drug use: No  . Sexual activity: Yes     Comment: menarche age 35, G68,  P79, menopause 2006   Other Topics Concern  . Not on file  Social History Narrative   Married one daughter born 1982 2 grandchildren   Network engineer at [employer]   1 cup of caffeine daily   07/18/2017      family history includes Benign prostatic hyperplasia in her father; Diabetes in her maternal grandmother and mother; Kidney disease in her mother. Review of Systems As per history of present illness. All other review of systems negative.  Objective:   Physical Exam @BP  (!) 148/102   Pulse 96   Ht 5\' 4"  (1.626 m)   Wt  187 lb 12.8 oz (85.2 kg)   BMI 32.24 kg/m @  General:  Well-developed, well-nourished and in no acute distress Eyes:  anicteric. ENT:   Mouth and posterior pharynx free of lesions.  Neck:   supple w/o thyromegaly or mass.  Lungs: Clear to auscultation bilaterally. Heart:  S1S2, no rubs, murmurs, gallops. Abdomen:  soft, non-tender, no hepatosplenomegaly, hernia, or mass and BS+.  Lymph:  no cervical or supraclavicular adenopathy. Extremities:   no edema, cyanosis or clubbing Skin   no rash. Neuro:  A&O x 3.  Psych:  appropriate mood and  Affect.   Data Reviewed:  2010 colonoscopy PC notes recent Labs in EMR

## 2017-07-25 ENCOUNTER — Encounter: Payer: Self-pay | Admitting: Internal Medicine

## 2017-07-28 ENCOUNTER — Ambulatory Visit (AMBULATORY_SURGERY_CENTER): Payer: BLUE CROSS/BLUE SHIELD | Admitting: Internal Medicine

## 2017-07-28 ENCOUNTER — Encounter: Payer: Self-pay | Admitting: Internal Medicine

## 2017-07-28 VITALS — BP 134/68 | HR 80 | Temp 98.4°F | Resp 17 | Ht 64.0 in | Wt 187.0 lb

## 2017-07-28 DIAGNOSIS — K222 Esophageal obstruction: Secondary | ICD-10-CM

## 2017-07-28 DIAGNOSIS — R1310 Dysphagia, unspecified: Secondary | ICD-10-CM | POA: Diagnosis not present

## 2017-07-28 DIAGNOSIS — R1319 Other dysphagia: Secondary | ICD-10-CM

## 2017-07-28 DIAGNOSIS — K449 Diaphragmatic hernia without obstruction or gangrene: Secondary | ICD-10-CM

## 2017-07-28 MED ORDER — SODIUM CHLORIDE 0.9 % IV SOLN: 500.0000 mL | INTRAVENOUS | Status: DC

## 2017-07-28 MED ORDER — PANTOPRAZOLE SODIUM 20 MG PO TBEC: 20.0000 mg | DELAYED_RELEASE_TABLET | Freq: Every day | ORAL | 3 refills | Status: DC

## 2017-07-28 NOTE — Op Note (Signed)
[place] Patient Name: Jane Ruiz Procedure Date: 07/28/2017 12:48 PM MRN: [id number] Endoscopist: Gatha Mayer , MD Age: 56 Referring MD:  Date of Birth: 1960/12/08 Gender: Female Account #: [id number] Procedure:                Upper GI endoscopy Indications:              Dysphagia Medicines:                Propofol per Anesthesia, Monitored Anesthesia Care Procedure:                Pre-Anesthesia Assessment:                           - Prior to the procedure, a History and Physical                            was performed, and patient medications and                            allergies were reviewed. The patient's tolerance of                            previous anesthesia was also reviewed. The risks                            and benefits of the procedure and the sedation                            options and risks were discussed with the patient.                            All questions were answered, and informed consent                            was obtained. Prior Anticoagulants: The patient has                            taken no previous anticoagulant or antiplatelet                            agents. ASA Grade Assessment: II - A patient with                            mild systemic disease. After reviewing the risks                            and benefits, the patient was deemed in                            satisfactory condition to undergo the procedure.                           After obtaining informed consent, the endoscope was  passed under direct vision. Throughout the                            procedure, the patient's blood pressure, pulse, and                            oxygen saturations were monitored continuously. The                            Endoscope was introduced through the mouth, and                            advanced to the second part of duodenum. The upper                            GI endoscopy was  accomplished without difficulty.                            The patient tolerated the procedure well. Scope In: Scope Out: Findings:                 One severe benign-appearing, intrinsic stenosis was                            found at the gastroesophageal junction. And was                            traversed with gentle manipulation of scope that                            slightly dilated. A TTS dilator was passed through                            the scope. Dilation with a 16-17-18 mm balloon                            dilator was performed to 18 mm. The dilation site                            was examined and showed moderate improvement in                            luminal narrowing. Estimated blood loss was minimal.                           A 7 cm hiatal hernia was present.                           The exam was otherwise without abnormality.                           The cardia and gastric fundus were normal on  retroflexion. Complications:            No immediate complications. Estimated Blood Loss:     Estimated blood loss was minimal. Impression:               - Benign-appearing esophageal stenosis. Dilated. 18                            mm max                           - 7 cm hiatal hernia.                           - The examination was otherwise normal.                           - No specimens collected. Recommendation:           - Patient has a contact number available for                            emergencies. The signs and symptoms of potential                            delayed complications were discussed with the                            patient. Return to normal activities tomorrow.                            Written discharge instructions were provided to the                            patient.                           - Clear liquids x 1 hour then soft foods rest of                            day. Start prior diet tomorrow.                            - Patient has a contact number available for                            emergencies. The signs and symptoms of potential                            delayed complications were discussed with the                            patient. Return to normal activities tomorrow.                            Written discharge instructions were provided to the  patient.                           - Continue present medications.                           - Use Protonix (pantoprazole) 20 mg PO daily for                            the rest of the patient's life. Gatha Mayer, MD 07/28/2017 1:28:57 PM This report has been signed electronically.

## 2017-07-28 NOTE — Progress Notes (Signed)
Pt's states no medical or surgical changes since  office visit. 

## 2017-07-28 NOTE — Progress Notes (Signed)
Called to room to assist during endoscopic procedure.  Patient ID and intended procedure confirmed with present staff. Received instructions for my participation in the procedure from the performing physician.  

## 2017-07-28 NOTE — Patient Instructions (Addendum)
I found a benign stricture of the esophagus and dilated it. This should fix the swallowing problems. In order to prevent future problems I want you to take pantoprazole 20 mg daily before breakfast. You should not stop this unless a doctor or PA or NP tells you to. I sent a prescription to your mail order pharmacy.  If after 2 months you are not completely better please let me know by calling my office and getting me a message.  You also have a large hiatal hernia but I do not think it needs surgery. Please read and follow the diet to reduce reflux.  I appreciate the opportunity to care for you. Gatha Mayer, MD, FACG  YOU HAD AN ENDOSCOPIC PROCEDURE TODAY AT [facility]:   Refer to the procedure report that was given to you for any specific questions about what was found during the examination.  If the procedure report does not answer your questions, please call your gastroenterologist to clarify.  If you requested that your care partner not be given the details of your procedure findings, then the procedure report has been included in a sealed envelope for you to review at your convenience later.  YOU SHOULD EXPECT: Some feelings of bloating in the abdomen. Passage of more gas than usual.  Walking can help get rid of the air that was put into your GI tract during the procedure and reduce the bloating. If you had a lower endoscopy (such as a colonoscopy or flexible sigmoidoscopy) you may notice spotting of blood in your stool or on the toilet paper. If you underwent a bowel prep for your procedure, you may not have a normal bowel movement for a few days.  Please Note:  You might notice some irritation and congestion in your nose or some drainage.  This is from the oxygen used during your procedure.  There is no need for concern and it should clear up in a day or so.  SYMPTOMS TO REPORT IMMEDIATELY:    Following upper endoscopy (EGD)  Vomiting of blood or coffee  ground material  New chest pain or pain under the shoulder blades  Painful or persistently difficult swallowing  New shortness of breath  Fever of 100F or higher  Black, tarry-looking stools  For urgent or emergent issues, a gastroenterologist can be reached at any hour by calling [phone].   DIET:  We do recommend a small meal at first, but then you may proceed to your regular diet.  Drink plenty of fluids but you should avoid alcoholic beverages for 24 hours.  ACTIVITY:  You should plan to take it easy for the rest of today and you should NOT DRIVE or use heavy machinery until tomorrow (because of the sedation medicines used during the test).    FOLLOW UP: Our staff will call the number listed on your records the next business day following your procedure to check on you and address any questions or concerns that you may have regarding the information given to you following your procedure. If we do not reach you, we will leave a message.  However, if you are feeling well and you are not experiencing any problems, there is no need to return our call.  We will assume that you have returned to your regular daily activities without incident.  If any biopsies were taken you will be contacted by phone or by letter within the next 1-3 weeks.  Please call us at [phone]  if you have not heard about the biopsies in 3 weeks.    SIGNATURES/CONFIDENTIALITY: You and/or your care partner have signed paperwork which will be entered into your electronic medical record.  These signatures attest to the fact that that the information above on your After Visit Summary has been reviewed and is understood.  Full responsibility of the confidentiality of this discharge information lies with you and/or your care-partner.  Post dilation diet given.  Hiatal hernia, and stricture information given.

## 2017-07-28 NOTE — Progress Notes (Signed)
To recovery, report to Scott, RN, VSS 

## 2017-07-31 ENCOUNTER — Telehealth: Payer: Self-pay | Admitting: *Deleted

## 2017-07-31 ENCOUNTER — Telehealth: Payer: Self-pay

## 2017-07-31 NOTE — Telephone Encounter (Signed)
Attempted to reach pt. With follow up call following endoscopic procedure 07/28/2017.   LM on pt. Ans. Machine.   Will try to reach pt. Again later today.

## 2017-07-31 NOTE — Telephone Encounter (Signed)
  Follow up Call-  Call back number 07/28/2017  Post procedure Call Back phone  # [phone]  Permission to leave phone message Yes  Some recent data might be hidden     Patient questions:  Do you have a fever, pain , or abdominal swelling? No. Pain Score  0 *  Have you tolerated food without any problems? Yes.    Have you been able to return to your normal activities? Yes.    Do you have any questions about your discharge instructions: Diet   No. Medications  No. Follow up visit  No.  Do you have questions or concerns about your Care? No.  Actions: * If pain score is 4 or above: No action needed, pain <4.

## 2017-08-01 DIAGNOSIS — Z1231 Encounter for screening mammogram for malignant neoplasm of breast: Secondary | ICD-10-CM | POA: Diagnosis not present

## 2017-08-02 ENCOUNTER — Encounter: Payer: Self-pay | Admitting: Family Medicine

## 2017-10-03 ENCOUNTER — Telehealth: Payer: Self-pay | Admitting: *Deleted

## 2017-10-03 NOTE — Telephone Encounter (Signed)
Returned patient's call regarding a follow up appt. Informed the patient that we will call her back once the schedule is out.

## 2017-11-10 IMAGING — CT CT ABD-PELV W/ CM
2 of 5 series · 16 of 46 positions shown, 18 images · IV contrast (iopamidol)
Comparison: 10/28/2015

CLINICAL DATA: Followup endometrial carcinoma. Previous
chemotherapy, radiation therapy, and TAH-BSO.

EXAM:
CT ABDOMEN AND PELVIS WITH CONTRAST
TECHNIQUE: Multidetector CT imaging of the abdomen and pelvis was performed
using the standard protocol following bolus administration of
intravenous contrast.
CONTRAST:  100mL LQOF49-NCC IOPAMIDOL (LQOF49-NCC) INJECTION 61%

[Series 2: abd/pel with · axial · 0.74mm/px · z∈[-482,-42]mm · 13 of 100 slices shown, 15 images]
[im 6/100  soft-tissue]
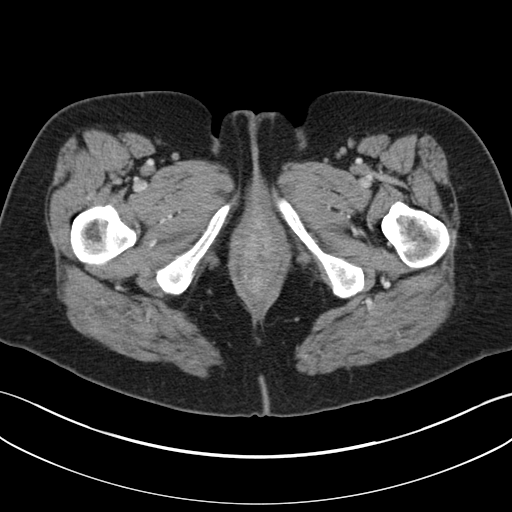
[im 6/100  bone]
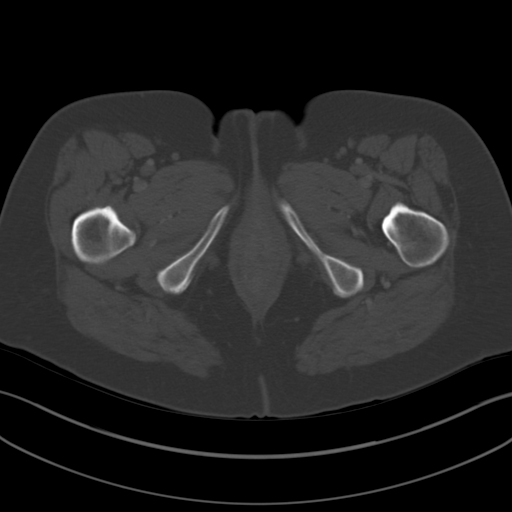
[im 12/100  soft-tissue]
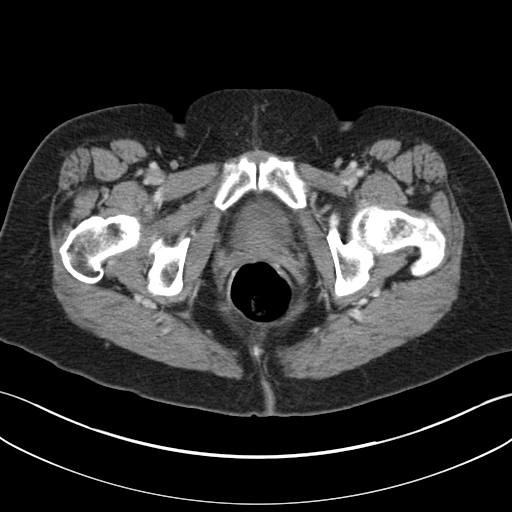
[im 23/100  soft-tissue]
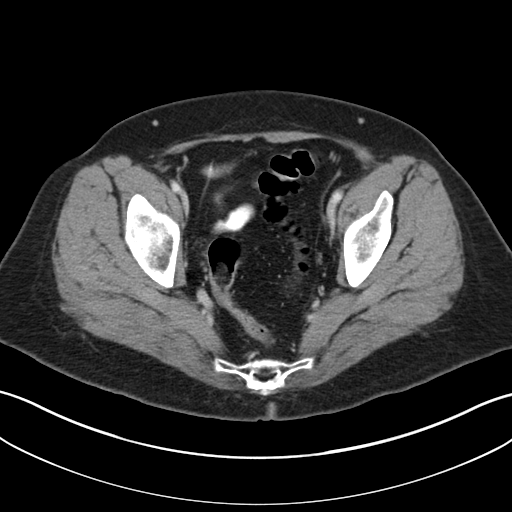
[im 28/100  soft-tissue]
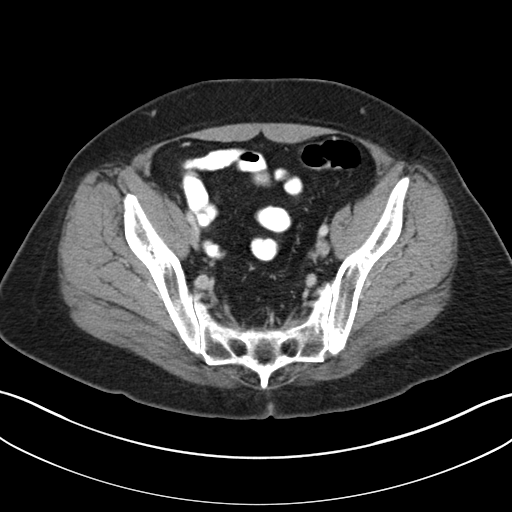
[im 34/100  soft-tissue]
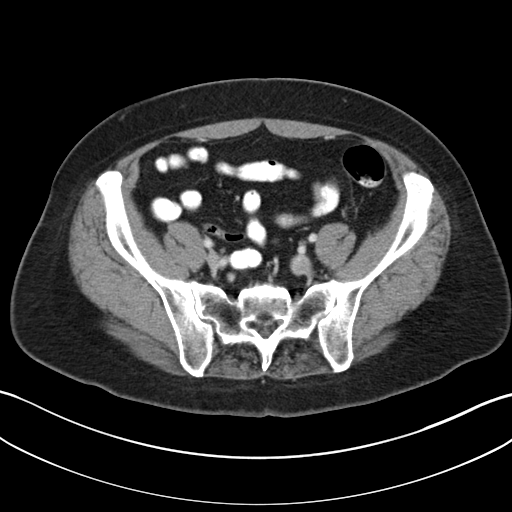
[im 45/100  soft-tissue]
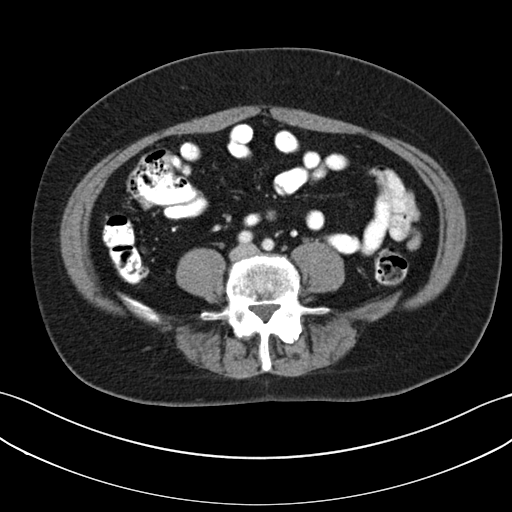
[im 50/100  soft-tissue]
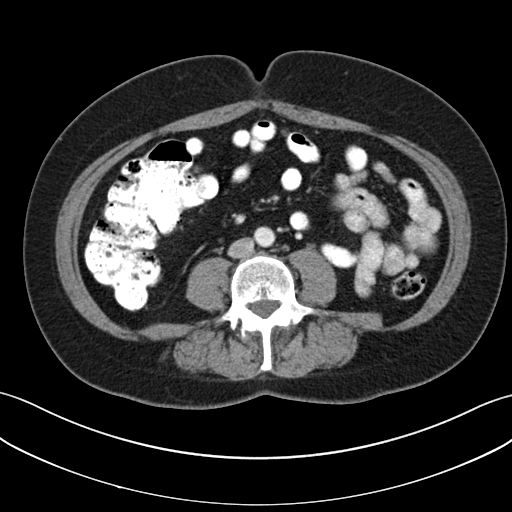
[im 56/100  soft-tissue]
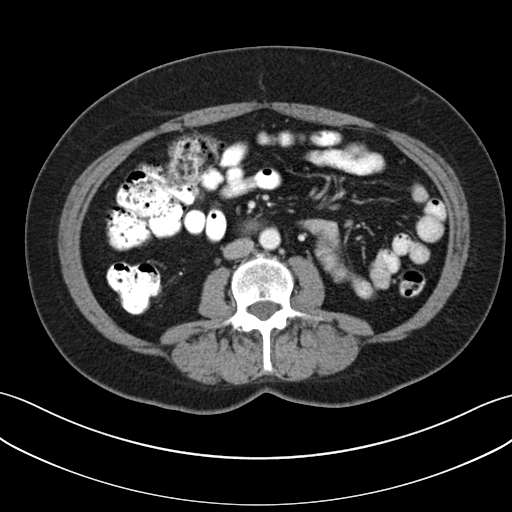
[im 67/100  soft-tissue]
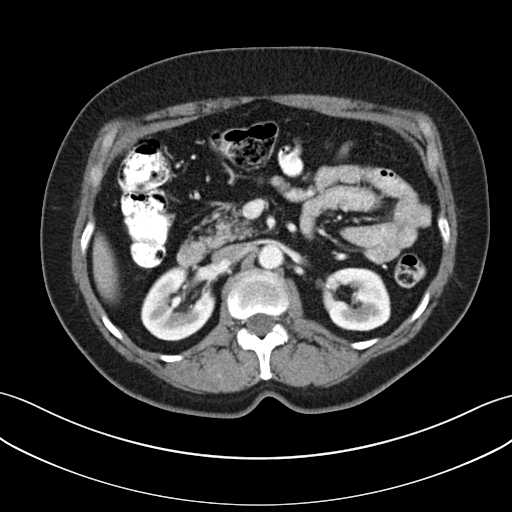
[im 67/100  bone]
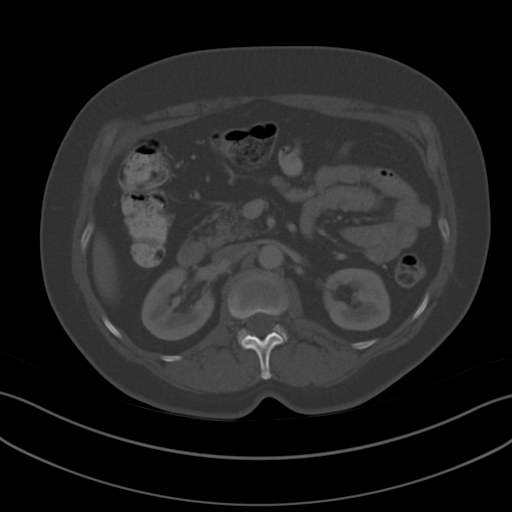
[im 72/100  soft-tissue]
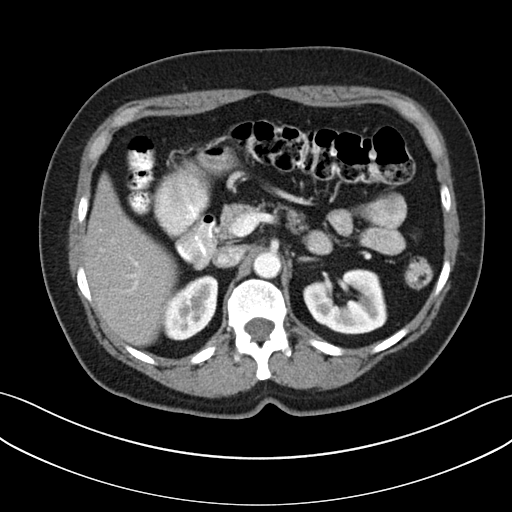
[im 78/100  soft-tissue]
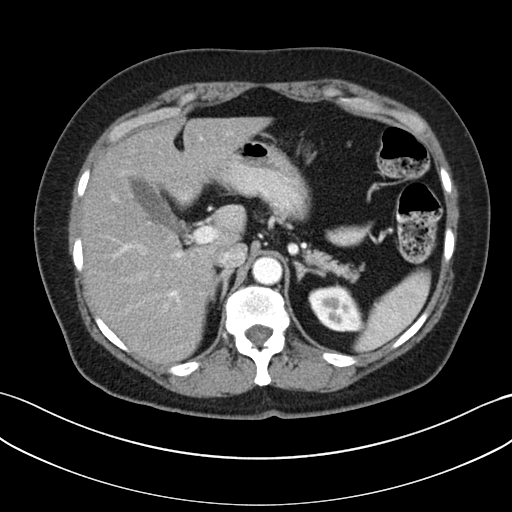
[im 89/100  soft-tissue]
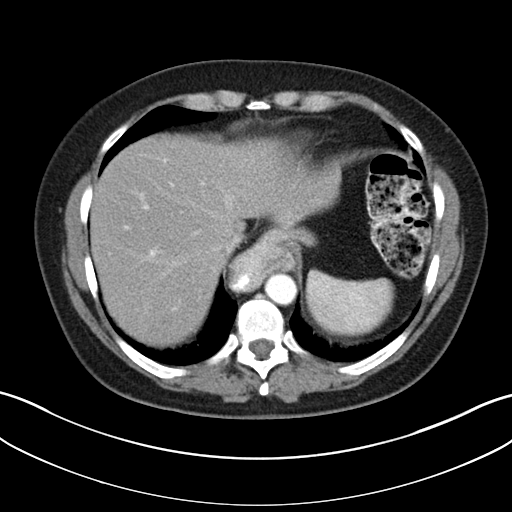
[im 94/100  soft-tissue]
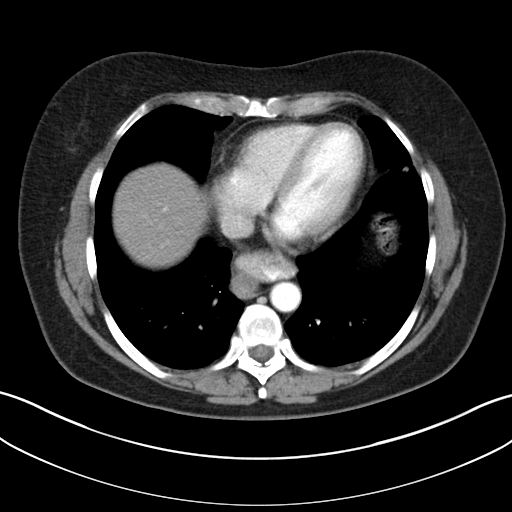

[Series 4: coronal a/|p · coronal · 0.74mm/px · 3 of 130 slices shown]
[im 44/130  soft-tissue]
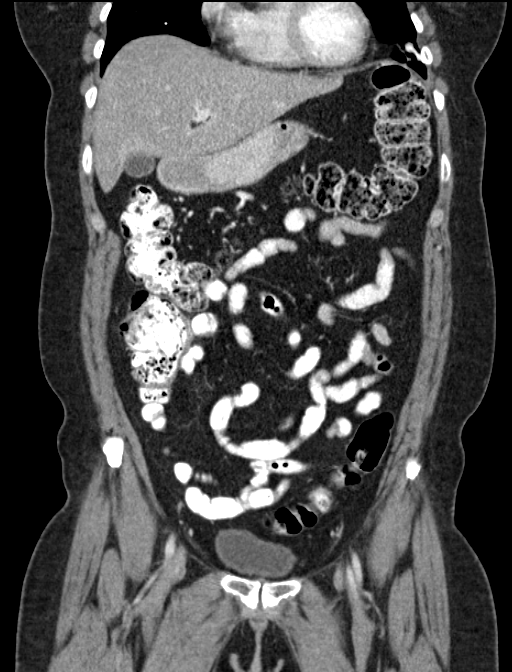
[im 58/130  soft-tissue]
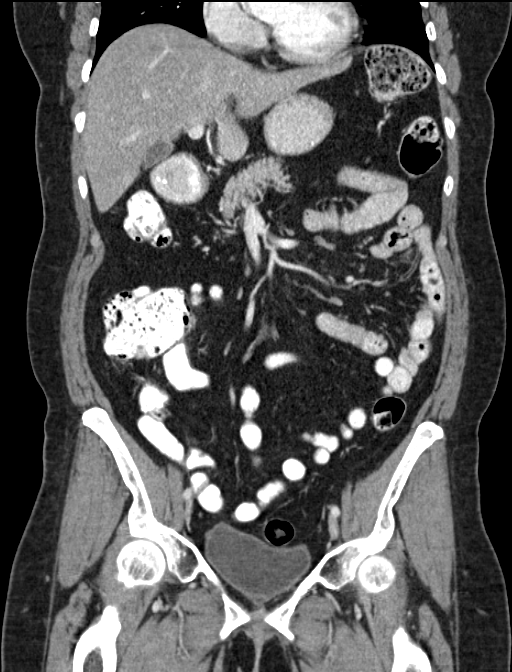
[im 72/130  soft-tissue]
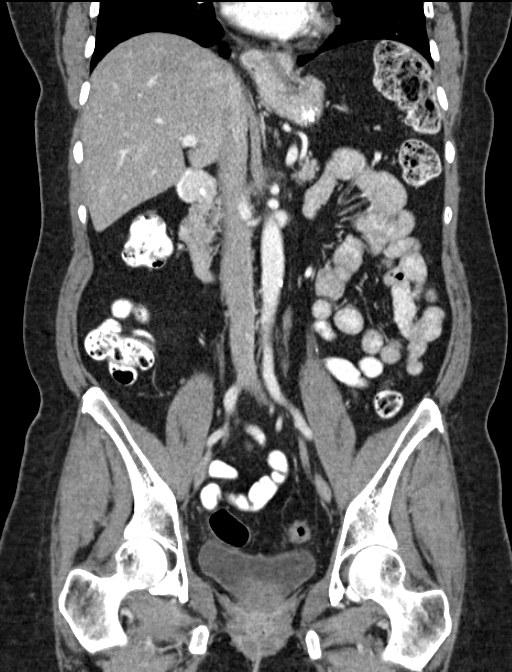

[16 of 46 positions shown; findings below may reference images not displayed]

FINDINGS: Lower Chest: No acute findings.

Hepatobiliary: No mass identified. Stable mild diffuse hepatic
steatosis. Gallbladder is unremarkable.

Pancreas:  No mass or inflammatory changes.

Spleen:  Unremarkable.

Adrenals/Urinary Tract: Stable 1.4 cm right adrenal mass, consistent
with benign adenoma. No renal masses identified. No evidence of
hydronephrosis. Unremarkable appearance of urinary bladder.
Previously noted diffuse bladder wall thickening though no longer
visualized.

Stomach/Bowel: Stable small to moderate hiatal hernia. No evidence
of obstruction, inflammatory process or abnormal fluid collections.

Vascular/Lymphatic: No pathologically enlarged lymph nodes. No
abdominal aortic aneurysm. Aortic atherosclerosis.

Reproductive: Prior hysterectomy noted. Vaginal cuff and adnexal
regions are unremarkable in appearance. No masses or ascites.

Other:  None.

Musculoskeletal:  No suspicious bone lesions identified.
IMPRESSION: No evidence of recurrent or metastatic carcinoma within the abdomen
or pelvis. No other acute findings identified.

Stable small to moderate hiatal hernia, mild hepatic steatosis, and
benign right adrenal adenoma.

## 2018-03-08 ENCOUNTER — Telehealth: Payer: Self-pay | Admitting: Family Medicine

## 2018-03-08 DIAGNOSIS — Z0000 Encounter for general adult medical examination without abnormal findings: Secondary | ICD-10-CM

## 2018-03-08 DIAGNOSIS — E7800 Pure hypercholesterolemia, unspecified: Secondary | ICD-10-CM

## 2018-03-08 NOTE — Telephone Encounter (Signed)
-----   Message from Ellamae Sia sent at 03/06/2018  3:36 PM EDT ----- Regarding: Lab orders for Wednesday, 4.10.19  AWV lab orders, please.

## 2018-03-14 ENCOUNTER — Other Ambulatory Visit (INDEPENDENT_AMBULATORY_CARE_PROVIDER_SITE_OTHER): Payer: BLUE CROSS/BLUE SHIELD

## 2018-03-14 DIAGNOSIS — Z0000 Encounter for general adult medical examination without abnormal findings: Secondary | ICD-10-CM | POA: Diagnosis not present

## 2018-03-14 DIAGNOSIS — E7800 Pure hypercholesterolemia, unspecified: Secondary | ICD-10-CM | POA: Diagnosis not present

## 2018-03-14 LAB — CBC WITH DIFFERENTIAL/PLATELET
BASOS PCT: 0.6 % (ref 0.0–3.0)
Basophils Absolute: 0.1 10*3/uL (ref 0.0–0.1)
EOS PCT: 3 % (ref 0.0–5.0)
Eosinophils Absolute: 0.3 10*3/uL (ref 0.0–0.7)
HEMATOCRIT: 39.8 % (ref 36.0–46.0)
Hemoglobin: 13.2 g/dL (ref 12.0–15.0)
Lymphocytes Relative: 15.9 % (ref 12.0–46.0)
Lymphs Abs: 1.4 10*3/uL (ref 0.7–4.0)
MCHC: 33.3 g/dL (ref 30.0–36.0)
MCV: 84.1 fl (ref 78.0–100.0)
MONOS PCT: 8.3 % (ref 3.0–12.0)
Monocytes Absolute: 0.7 10*3/uL (ref 0.1–1.0)
NEUTROS ABS: 6.2 10*3/uL (ref 1.4–7.7)
NEUTROS PCT: 72.2 % (ref 43.0–77.0)
PLATELETS: 327 10*3/uL (ref 150.0–400.0)
RBC: 4.73 Mil/uL (ref 3.87–5.11)
RDW: 15.2 % (ref 11.5–15.5)
WBC: 8.6 10*3/uL (ref 4.0–10.5)

## 2018-03-14 LAB — LIPID PANEL
CHOL/HDL RATIO: 4
Cholesterol: 161 mg/dL (ref 0–200)
HDL: 36.5 mg/dL — AB (ref >39.00)
LDL CALC: 91 mg/dL (ref 0–99)
NONHDL: 124.72
Triglycerides: 171 mg/dL — ABNORMAL HIGH (ref 0.0–149.0)
VLDL: 34.2 mg/dL (ref 0.0–40.0)

## 2018-03-14 LAB — COMPREHENSIVE METABOLIC PANEL
ALT: 16 U/L (ref 0–35)
AST: 16 U/L (ref 0–37)
Albumin: 4.1 g/dL (ref 3.5–5.2)
Alkaline Phosphatase: 77 U/L (ref 39–117)
BUN: 11 mg/dL (ref 6–23)
CHLORIDE: 103 meq/L (ref 96–112)
CO2: 28 meq/L (ref 19–32)
Calcium: 9.6 mg/dL (ref 8.4–10.5)
Creatinine, Ser: 0.79 mg/dL (ref 0.40–1.20)
GFR: 79.11 mL/min (ref >60.00)
Glucose, Bld: 91 mg/dL (ref 70–99)
POTASSIUM: 3.8 meq/L (ref 3.5–5.1)
Sodium: 141 mEq/L (ref 135–145)
Total Bilirubin: 0.5 mg/dL (ref 0.2–1.2)
Total Protein: 7.2 g/dL (ref 6.0–8.3)

## 2018-03-14 LAB — TSH: TSH: 4.37 u[IU]/mL (ref 0.35–4.50)

## 2018-03-19 ENCOUNTER — Ambulatory Visit (INDEPENDENT_AMBULATORY_CARE_PROVIDER_SITE_OTHER): Payer: BLUE CROSS/BLUE SHIELD | Admitting: Family Medicine

## 2018-03-19 ENCOUNTER — Encounter: Payer: Self-pay | Admitting: Family Medicine

## 2018-03-19 VITALS — BP 126/78 | HR 76 | Temp 98.5°F | Ht 64.25 in | Wt 192.8 lb

## 2018-03-19 DIAGNOSIS — Z23 Encounter for immunization: Secondary | ICD-10-CM

## 2018-03-19 DIAGNOSIS — C541 Malignant neoplasm of endometrium: Secondary | ICD-10-CM | POA: Diagnosis not present

## 2018-03-19 DIAGNOSIS — Z0000 Encounter for general adult medical examination without abnormal findings: Secondary | ICD-10-CM

## 2018-03-19 DIAGNOSIS — E7800 Pure hypercholesterolemia, unspecified: Secondary | ICD-10-CM

## 2018-03-19 DIAGNOSIS — Z6832 Body mass index (BMI) 32.0-32.9, adult: Secondary | ICD-10-CM | POA: Diagnosis not present

## 2018-03-19 DIAGNOSIS — E6609 Other obesity due to excess calories: Secondary | ICD-10-CM

## 2018-03-19 MED ORDER — ATORVASTATIN CALCIUM 10 MG PO TABS: 10.0000 mg | ORAL_TABLET | Freq: Every evening | ORAL | 3 refills | Status: DC

## 2018-03-19 NOTE — Assessment & Plan Note (Signed)
Doing well with oncol and gyn f/u

## 2018-03-19 NOTE — Assessment & Plan Note (Signed)
Discussed how this problem influences overall health and the risks it imposes  Reviewed plan for weight loss with lower calorie diet (via better food choices and also portion control or program like weight watchers) and exercise building up to or more than 30 minutes 5 days per week including some aerobic activity   Will begin with 5-8 min of exercise and increase steadily as tolerated Disc reducing processed carbs

## 2018-03-19 NOTE — Assessment & Plan Note (Signed)
Reviewed health habits including diet and exercise and skin cancer prevention Reviewed appropriate screening tests for age  Also reviewed health mt list, fam hx and immunization status , as well as social and family history   See HPI Labs rev  Avs: For exercise - start slowly - walking (treadmill or outdoor) 5-8 minutes per day  Add 5 minutes per week  Getting conditioned again will help energy level   Colonoscopy will be due after December - if you do not get a reminder call us   If you are interested in the new shingles vaccine (Shingrix) - call your local pharmacy to check on coverage and availability  Call pharmacy to get on a wait list    Tdap (tetanus vaccine) today

## 2018-03-19 NOTE — Assessment & Plan Note (Signed)
Disc goals for lipids and reasons to control them Rev labs with pt Rev low sat fat diet in detail Continue atorvastatin and diet  

## 2018-03-19 NOTE — Progress Notes (Signed)
Subjective:    Patient ID: Jane Ruiz, female    DOB: 04-26-1961, 57 y.o.   MRN: [id number]  HPI Here for health maintenance exam and to review chronic medical problems    Found out she has hiatal hernia  Had EGD - and stretched esoph stricture  Feels better-on protonix  She is watching her diet carefully    Wt Readings from Last 3 Encounters:  03/19/18 192 lb 12 oz (87.4 kg)  07/28/17 187 lb (84.8 kg)  07/18/17 187 lb 12.8 oz (85.2 kg)  went down and up again  Trying to get energy level back up -struggling with that  Likes to walk on treadmill  32.83 kg/m   Mammogram 8/18 nl Self breast exam - no lumps   Tetanus shot 12/09-will get today  Will be due in dec   Colonoscopy 7/10 hyperplastic polyp with 10 y recall   Pap 2/18 negative (vag cuff) H/o endometrial cancer-with hysterectomy  Is due for a f/u- has appt for august  Follow up for cancer is going well   Zoster status -never had it  Would consider shingrix   Takes ca and D for bone health    Hyperlipidemia Lab Results  Component Value Date   CHOL 161 03/14/2018   CHOL 167 03/06/2017   CHOL 163 12/29/2015   Lab Results  Component Value Date   HDL 36.50 (L) 03/14/2018   HDL 35.10 (L) 03/06/2017   HDL 36.70 (L) 12/29/2015   Lab Results  Component Value Date   LDLCALC 91 03/14/2018   LDLCALC 103 (H) 03/06/2017   LDLCALC 94 12/29/2015   Lab Results  Component Value Date   TRIG 171.0 (H) 03/14/2018   TRIG 142.0 03/06/2017   TRIG 161.0 (H) 12/29/2015   Lab Results  Component Value Date   CHOLHDL 4 03/14/2018   CHOLHDL 5 03/06/2017   CHOLHDL 4 12/29/2015   Lab Results  Component Value Date   LDLDIRECT 172.4 12/24/2010   LDLDIRECT 174.9 09/17/2010   LDLDIRECT 164.2 07/06/2010  atorvastatin 10 mg and diet  Down LDL with less fatty foods  Need to exercise   Other labs Results for orders placed or performed in visit on 03/14/18  TSH  Result Value Ref Range   TSH 4.37 0.35 - 4.50  uIU/mL  Lipid panel  Result Value Ref Range   Cholesterol 161 0 - 200 mg/dL   Triglycerides 171.0 (H) 0.0 - 149.0 mg/dL   HDL 36.50 (L) >39.00 mg/dL   VLDL 34.2 0.0 - 40.0 mg/dL   LDL Cholesterol 91 0 - 99 mg/dL   Total CHOL/HDL Ratio 4    NonHDL 124.72   Comprehensive metabolic panel  Result Value Ref Range   Sodium 141 135 - 145 mEq/L   Potassium 3.8 3.5 - 5.1 mEq/L   Chloride 103 96 - 112 mEq/L   CO2 28 19 - 32 mEq/L   Glucose, Bld 91 70 - 99 mg/dL   BUN 11 6 - 23 mg/dL   Creatinine, Ser 0.79 0.40 - 1.20 mg/dL   Total Bilirubin 0.5 0.2 - 1.2 mg/dL   Alkaline Phosphatase 77 39 - 117 U/L   AST 16 0 - 37 U/L   ALT 16 0 - 35 U/L   Total Protein 7.2 6.0 - 8.3 g/dL   Albumin 4.1 3.5 - 5.2 g/dL   Calcium 9.6 8.4 - 10.5 mg/dL   GFR 79.11 >60.00 mL/min  CBC with Differential/Platelet  Result Value Ref Range  WBC 8.6 4.0 - 10.5 K/uL   RBC 4.73 3.87 - 5.11 Mil/uL   Hemoglobin 13.2 12.0 - 15.0 g/dL   HCT 39.8 36.0 - 46.0 %   MCV 84.1 78.0 - 100.0 fl   MCHC 33.3 30.0 - 36.0 g/dL   RDW 15.2 11.5 - 15.5 %   Platelets 327.0 150.0 - 400.0 K/uL   Neutrophils Relative % 72.2 43.0 - 77.0 %   Lymphocytes Relative 15.9 12.0 - 46.0 %   Monocytes Relative 8.3 3.0 - 12.0 %   Eosinophils Relative 3.0 0.0 - 5.0 %   Basophils Relative 0.6 0.0 - 3.0 %   Neutro Abs 6.2 1.4 - 7.7 K/uL   Lymphs Abs 1.4 0.7 - 4.0 K/uL   Monocytes Absolute 0.7 0.1 - 1.0 K/uL   Eosinophils Absolute 0.3 0.0 - 0.7 K/uL   Basophils Absolute 0.1 0.0 - 0.1 K/uL      Patient Active Problem List   Diagnosis Date Noted  . Dysphagia 05/23/2017  . Endometrial cancer ([place]) 02/12/2013  . Other screening mammogram 11/04/2011  . Obesity 11/04/2011  . Routine general medical examination at a health care facility 10/20/2011  . PLANTAR FASCIITIS 12/31/2010  . Hyperlipidemia 09/24/2010  . MENOPAUSAL SYNDROME 07/06/2010   Past Medical History:  Diagnosis Date  . endometrial ca 02/12/13   endometrial, positive nodes  .  External hemorrhoids   . History of radiation therapy 05/27/2013-07/01/2013   pelvis 45 gray in 25 fractions, 63 gray with high dose brachytherapy  . Hyperlipidemia    borderline high cholesterol  . Hyperplastic colon polyp   . Internal hemorrhoids   . Migraine   . Skin lesion    non maligant skin lesions on head   Past Surgical History:  Procedure Laterality Date  . ABDOMINAL HYSTERECTOMY  02/12/13  . COLONOSCOPY W/ BIOPSIES  06/15/2009   Dr Watt Climes - distal hyperplastic polyps  . RHINOPLASTY     breathing problems- Dr Ernesto Rutherford  . ROBOTIC ASSISTED TOTAL HYSTERECTOMY WITH BILATERAL SALPINGO OOPHERECTOMY N/A 02/12/2013   Procedure: ROBOTIC ASSISTED TOTAL HYSTERECTOMY WITH BILATERAL SALPINGO OOPHORECTOMY,  LYMPH NODES;  Surgeon: Imagene Gurney A. Alycia Rossetti, MD;  Location: [facility];  Service: Gynecology;  Laterality: N/A;   Social History   Tobacco Use  . Smoking status: Never Smoker  . Smokeless tobacco: Never Used  Substance Use Topics  . Alcohol use: No    Alcohol/week: 0.0 oz  . Drug use: No   Family History  Problem Relation Age of Onset  . Diabetes Mother   . Kidney disease Mother   . Benign prostatic hyperplasia Father   . Diabetes Maternal Grandmother   . Colon cancer Neg Hx    No Known Allergies Current Outpatient Medications on File Prior to Visit  Medication Sig Dispense Refill  . Biotin 5000 MCG CAPS Take 1 capsule by mouth daily.    . calcium carbonate (TUMS EX) 750 MG chewable tablet Chew 2 tablets by mouth 2 (two) times daily.    . Cholecalciferol (VITAMIN D3 PO) Take 25 mg by mouth.    . Multiple Vitamin (MULTIVITAMIN) tablet Take 1 tablet by mouth daily.     . Omega-3 Fatty Acids (FISH OIL) 1200 MG CAPS Take 1 capsule by mouth daily.    Marland Kitchen OVER THE COUNTER MEDICATION viviscal supplement    . pantoprazole (PROTONIX) 20 MG tablet Take 1 tablet (20 mg total) by mouth daily before breakfast. 90 tablet 3   No current facility-administered medications on file prior to visit.  Review of Systems  Constitutional: Positive for fatigue. Negative for activity change, appetite change, fever and unexpected weight change.  HENT: Negative for congestion, ear pain, rhinorrhea, sinus pressure and sore throat.   Eyes: Negative for pain, redness and visual disturbance.  Respiratory: Negative for cough, shortness of breath and wheezing.   Cardiovascular: Negative for chest pain and palpitations.  Gastrointestinal: Negative for abdominal pain, blood in stool, constipation and diarrhea.  Endocrine: Negative for polydipsia and polyuria.  Genitourinary: Negative for dysuria, frequency and urgency.  Musculoskeletal: Negative for arthralgias, back pain and myalgias.  Skin: Negative for pallor and rash.  Allergic/Immunologic: Negative for environmental allergies.  Neurological: Negative for dizziness, syncope and headaches.  Hematological: Negative for adenopathy. Does not bruise/bleed easily.  Psychiatric/Behavioral: Negative for decreased concentration and dysphoric mood. The patient is not nervous/anxious.        Objective:   Physical Exam  Constitutional: She appears well-developed and well-nourished. No distress.  obese and well appearing   HENT:  Head: Normocephalic and atraumatic.  Right Ear: External ear normal.  Left Ear: External ear normal.  Mouth/Throat: Oropharynx is clear and moist.  Eyes: Pupils are equal, round, and reactive to light. Conjunctivae and EOM are normal. No scleral icterus.  Neck: Normal range of motion. Neck supple. No JVD present. Carotid bruit is not present. No thyromegaly present.  Cardiovascular: Normal rate, regular rhythm, normal heart sounds and intact distal pulses. Exam reveals no gallop.  Pulmonary/Chest: Effort normal and breath sounds normal. No respiratory distress. She has no wheezes. She exhibits no tenderness. No breast swelling, tenderness, discharge or bleeding.  Abdominal: Soft. Bowel sounds are normal. She exhibits no  distension, no abdominal bruit and no mass. There is no tenderness.  Genitourinary: No breast swelling, tenderness, discharge or bleeding.  Genitourinary Comments: Breast exam: No mass, nodules, thickening, tenderness, bulging, retraction, inflamation, nipple discharge or skin changes noted.  No axillary or clavicular LA.      Musculoskeletal: Normal range of motion. She exhibits no edema or tenderness.  No kyphosis   Lymphadenopathy:    She has no cervical adenopathy.  Neurological: She is alert. She has normal reflexes. No cranial nerve deficit. She exhibits normal muscle tone. Coordination normal.  Skin: Skin is warm and dry. No rash noted. No erythema. No pallor.  Solar lentigines diffusely   Psychiatric: She has a normal mood and affect.          Assessment & Plan:   Problem List Items Addressed This Visit      Genitourinary   [place]    Doing well with oncol and gyn f/u        Other   Hyperlipidemia    Disc goals for lipids and reasons to control them Rev labs with pt Rev low sat fat diet in detail Continue atorvastatin and diet        Relevant Medications   atorvastatin (LIPITOR) 10 MG tablet   Obesity    Discussed how this problem influences overall health and the risks it imposes  Reviewed plan for weight loss with lower calorie diet (via better food choices and also portion control or program like weight watchers) and exercise building up to or more than 30 minutes 5 days per week including some aerobic activity   Will begin with 5-8 min of exercise and increase steadily as tolerated Disc reducing processed carbs      Routine general medical examination at a health care facility - Primary    Reviewed  health habits including diet and exercise and skin cancer prevention Reviewed appropriate screening tests for age  Also reviewed health mt list, fam hx and immunization status , as well as social and family history   See HPI Labs  rev  Avs: For exercise - start slowly - walking (treadmill or outdoor) 5-8 minutes per day  Add 5 minutes per week  Getting conditioned again will help energy level   Colonoscopy will be due after December - if you do not get a reminder call us   If you are interested in the new shingles vaccine (Shingrix) - call your local pharmacy to check on coverage and availability  Call pharmacy to get on a wait list    Tdap (tetanus vaccine) today        Other Visit Diagnoses    Need for Tdap vaccination       Relevant Orders   Tdap vaccine greater than or equal to 7yo IM (Completed)

## 2018-03-19 NOTE — Patient Instructions (Addendum)
For exercise - start slowly - walking (treadmill or outdoor) 5-8 minutes per day  Add 5 minutes per week  Getting conditioned again will help energy level   Colonoscopy will be due after December - if you do not get a reminder call us   If you are interested in the new shingles vaccine (Shingrix) - call your local pharmacy to check on coverage and availability  Call pharmacy to get on a wait list    Tdap (tetanus vaccine) today

## 2018-05-31 ENCOUNTER — Ambulatory Visit (INDEPENDENT_AMBULATORY_CARE_PROVIDER_SITE_OTHER): Payer: BLUE CROSS/BLUE SHIELD | Admitting: Nurse Practitioner

## 2018-05-31 ENCOUNTER — Encounter: Payer: Self-pay | Admitting: Nurse Practitioner

## 2018-05-31 ENCOUNTER — Encounter (INDEPENDENT_AMBULATORY_CARE_PROVIDER_SITE_OTHER): Payer: Self-pay

## 2018-05-31 VITALS — BP 118/80 | HR 118 | Ht 65.0 in | Wt 196.2 lb

## 2018-05-31 DIAGNOSIS — K625 Hemorrhage of anus and rectum: Secondary | ICD-10-CM | POA: Diagnosis not present

## 2018-05-31 MED ORDER — HYDROCORTISONE 2.5 % RE CREA: 1.0000 "application " | TOPICAL_CREAM | Freq: Every day | RECTAL | 1 refills | Status: DC

## 2018-05-31 NOTE — Patient Instructions (Addendum)
If you are age 59 or older, your body mass index should be between 23-30. Your Body mass index is 32.65 kg/m. If this is out of the aforementioned range listed, please consider follow up with your Primary Care Provider.  If you are age 38 or younger, your body mass index should be between 19-25. Your Body mass index is 32.65 kg/m. If this is out of the aformentioned range listed, please consider follow up with your Primary Care Provider.   You have been scheduled for a colonoscopy. Please follow written instructions given to you at your visit today.  Please pick up your prep supplies at the pharmacy within the next 1-3 days. If you use inhalers (even only as needed), please bring them with you on the day of your procedure. Your physician has requested that you go to www.startemmi.com and enter the access code given to you at your visit today. This web site gives a general overview about your procedure. However, you should still follow specific instructions given to you by our office regarding your preparation for the procedure.  We have sent the following medications to your pharmacy for you to pick up at your convenience: Anusol Cream  Thank you for choosing me and [facility].   Tye Savoy, NP

## 2018-05-31 NOTE — Progress Notes (Addendum)
Chief Complaint: rectal bleeding       ASSESSMENT AND PLAN;   57 yo female with painless rectal bleeding this am. Small inflamed hemorrhoids just inside anal vault on anoscopy. Clot removed about the hemorrhoids so not sure bleeding was hemorrhoidal.  -anusol hc cream inside rectal Q HS x 7 days -keep stools soft, no straining.  -If bleeding continues she will go to ED as I can't exclude diverticular hemorrhage at this point.  -Otherwise will arrange for colonoscopy to be done on 06/13/18. The risks and benefits of colonoscopy with possible polypectomy were discussed and the patient agrees to proceed.    Agree with Ms. Oluwatoyin Banales's assessment and plan. Gatha Mayer, MD, [facility]   HPI:    Patient is a 57 year old female known to Dr. Carlean Purl.  She has a history of chronic intermittent dysphagia. This am patient had blood in her stool. No rectal or abdominal pain. Thought initially it was tomato from a salad but went to work and had another BM with blood. When patient gets nervous she tends to have diarrhea so she has had a few loose BMs today, all with blood. No recent straining or constipation.She has a hx of GYN cancer, s/p hysterectomy and chemoxrt. She is nervous about colon cancer.   No other GI complaints. No N/V, weight loss.   Past Medical History:  Diagnosis Date  . endometrial ca 02/12/13   endometrial, positive nodes  . External hemorrhoids   . History of radiation therapy 05/27/2013-07/01/2013   pelvis 45 gray in 25 fractions, 63 gray with high dose brachytherapy  . Hyperlipidemia    borderline high cholesterol  . Hyperplastic colon polyp   . Internal hemorrhoids   . Migraine   . Skin lesion    non maligant skin lesions on head     Past Surgical History:  Procedure Laterality Date  . ABDOMINAL HYSTERECTOMY  02/12/13  . COLONOSCOPY W/ BIOPSIES  06/15/2009   Dr Watt Climes - distal hyperplastic polyps  . RHINOPLASTY     breathing problems- Dr Ernesto Rutherford  . ROBOTIC  ASSISTED TOTAL HYSTERECTOMY WITH BILATERAL SALPINGO OOPHERECTOMY N/A 02/12/2013   Procedure: ROBOTIC ASSISTED TOTAL HYSTERECTOMY WITH BILATERAL SALPINGO OOPHORECTOMY,  LYMPH NODES;  Surgeon: Imagene Gurney A. Alycia Rossetti, MD;  Location: [facility];  Service: Gynecology;  Laterality: N/A;     Current Outpatient Medications  Medication Sig Dispense Refill  . atorvastatin (LIPITOR) 10 MG tablet Take 1 tablet (10 mg total) by mouth every evening. 90 tablet 3  . Biotin 5000 MCG CAPS Take 1 capsule by mouth daily.    . calcium carbonate (TUMS EX) 750 MG chewable tablet Chew 2 tablets by mouth 2 (two) times daily.    . Cholecalciferol (VITAMIN D3 PO) Take 25 mg by mouth.    . Multiple Vitamin (MULTIVITAMIN) tablet Take 1 tablet by mouth daily.     . Omega-3 Fatty Acids (FISH OIL) 1200 MG CAPS Take 1 capsule by mouth daily.    Marland Kitchen OVER THE COUNTER MEDICATION viviscal supplement    . pantoprazole (PROTONIX) 20 MG tablet Take 1 tablet (20 mg total) by mouth daily before breakfast. 90 tablet 3   No current facility-administered medications for this visit.    No Known Allergies   Review of Systems: No chest pain, dizziness, short of breath. No urinary sx.   Creatinine clearance cannot be calculated (Patient's most recent lab result is older than the maximum 21 days allowed.)   Physical Exam:    Wt  Readings from Last 3 Encounters:  05/31/18 196 lb 3.2 oz (89 kg)  03/19/18 192 lb 12 oz (87.4 kg)  07/28/17 187 lb (84.8 kg)    BP 118/80   Pulse (!) 118   Ht 5\' 5"  (1.651 m)   Wt 196 lb 3.2 oz (89 kg)   BMI 32.65 kg/m  Constitutional:  Pleasant female in no acute distress. Psychiatric: Normal mood and affect. Behavior is normal. EENT: Pupils normal.  Conjunctivae are normal. No scleral icterus. Neck supple.  Cardiovascular: Normal rate, regular rhythm. No edema Pulmonary/chest: Effort normal and breath sounds normal. No wheezing, rales or rhonchi. Abdominal: Soft, nondistended, nontender. Bowel sounds active  throughout. There are no masses palpable. No hepatomegaly. Rectal: no external lesions. No lesions felt on DRE. On anoscopy there was a small dark red clot. Removed the clot but nothing under it to explain the bleeding. A few small inflamed hemorrhoids seen just inside anal vault but bleeding appeared to have originated proximal to those.  Neurological: Alert and oriented to person place and time. Skin: Skin is warm and dry. No rashes noted.  Tye Savoy, NP  05/31/2018, 2:24 PM  Cc: Tower, Wynelle Fanny, MD

## 2018-06-02 ENCOUNTER — Encounter: Payer: Self-pay | Admitting: Nurse Practitioner

## 2018-06-13 ENCOUNTER — Encounter: Payer: Self-pay | Admitting: Internal Medicine

## 2018-06-13 ENCOUNTER — Other Ambulatory Visit: Payer: Self-pay

## 2018-06-13 ENCOUNTER — Ambulatory Visit (AMBULATORY_SURGERY_CENTER): Payer: BLUE CROSS/BLUE SHIELD | Admitting: Internal Medicine

## 2018-06-13 VITALS — BP 130/72 | HR 81 | Temp 98.6°F | Resp 11 | Ht 65.0 in | Wt 196.0 lb

## 2018-06-13 DIAGNOSIS — K648 Other hemorrhoids: Secondary | ICD-10-CM | POA: Diagnosis not present

## 2018-06-13 DIAGNOSIS — Z1211 Encounter for screening for malignant neoplasm of colon: Secondary | ICD-10-CM | POA: Diagnosis not present

## 2018-06-13 DIAGNOSIS — K921 Melena: Secondary | ICD-10-CM | POA: Diagnosis not present

## 2018-06-13 DIAGNOSIS — D125 Benign neoplasm of sigmoid colon: Secondary | ICD-10-CM

## 2018-06-13 DIAGNOSIS — Z86010 Personal history of colonic polyps: Secondary | ICD-10-CM

## 2018-06-13 DIAGNOSIS — Z860101 Personal history of adenomatous and serrated colon polyps: Secondary | ICD-10-CM

## 2018-06-13 HISTORY — DX: Personal history of colonic polyps: Z86.010

## 2018-06-13 HISTORY — DX: Personal history of adenomatous and serrated colon polyps: Z86.0101

## 2018-06-13 MED ORDER — PANTOPRAZOLE SODIUM 20 MG PO TBEC: 20.0000 mg | DELAYED_RELEASE_TABLET | Freq: Every day | ORAL | 3 refills | Status: DC

## 2018-06-13 NOTE — Progress Notes (Signed)
Called to room to assist during endoscopic procedure.  Patient ID and intended procedure confirmed with present staff. Received instructions for my participation in the procedure from the performing physician.  

## 2018-06-13 NOTE — Progress Notes (Signed)
A and O x3. Report to RN. Tolerated MAC anesthesia well.

## 2018-06-13 NOTE — Op Note (Signed)
[facility] Patient Name: Jane Ruiz Procedure Date: 06/13/2018 2:39 PM MRN: [id number] Endoscopist: Gatha Mayer , MD Age: 57 Referring MD:  Date of Birth: 07/17/1961 Gender: Female Account #: [id number] Procedure:                Colonoscopy Indications:              Hematochezia Medicines:                Propofol per Anesthesia, Monitored Anesthesia Care Procedure:                Pre-Anesthesia Assessment:                           - Prior to the procedure, a History and Physical                            was performed, and patient medications and                            allergies were reviewed. The patient's tolerance of                            previous anesthesia was also reviewed. The risks                            and benefits of the procedure and the sedation                            options and risks were discussed with the patient.                            All questions were answered, and informed consent                            was obtained. Prior Anticoagulants: The patient has                            taken no previous anticoagulant or antiplatelet                            agents. ASA Grade Assessment: II - A patient with                            mild systemic disease. After reviewing the risks                            and benefits, the patient was deemed in                            satisfactory condition to undergo the procedure.                           After obtaining informed consent, the colonoscope  was passed under direct vision. Throughout the                            procedure, the patient's blood pressure, pulse, and                            oxygen saturations were monitored continuously. The                            Colonoscope was introduced through the anus and                            advanced to the the cecum, identified by                            appendiceal orifice and ileocecal  valve. The                            colonoscopy was performed without difficulty. The                            patient tolerated the procedure well. The quality                            of the bowel preparation was excellent. The                            ileocecal valve, appendiceal orifice, and rectum                            were photographed. The bowel preparation used was                            Miralax. Scope In: 2:49:06 PM Scope Out: 3:04:21 PM Scope Withdrawal Time: 0 hours 11 minutes 33 seconds  Total Procedure Duration: 0 hours 15 minutes 15 seconds  Findings:                 The perianal and digital rectal examinations were                            normal.                           A 10 mm polyp was found in the sigmoid colon. The                            polyp was sessile. The polyp was removed with a                            cold snare. The polyp was removed with a piecemeal                            technique using a cold snare. Resection and  retrieval were complete. Verification of patient                            identification for the specimen was done. Estimated                            blood loss was minimal.                           Internal hemorrhoids were found during retroflexion.                           The exam was otherwise without abnormality on                            direct and retroflexion views. Complications:            No immediate complications. Estimated Blood Loss:     Estimated blood loss was minimal. Impression:               - One 10 mm polyp in the sigmoid colon, removed                            with a cold snare and removed piecemeal using a                            cold snare. Resected and retrieved.                           - Internal hemorrhoids.                           - The examination was otherwise normal on direct                            and retroflexion  views. Recommendation:           - Patient has a contact number available for                            emergencies. The signs and symptoms of potential                            delayed complications were discussed with the                            patient. Return to normal activities tomorrow.                            Written discharge instructions were provided to the                            patient.                           - Resume previous diet.                           -  Continue present medications.                           - Repeat colonoscopy is recommended. The                            colonoscopy date will be determined after pathology                            results from today's exam become available for                            review.                           - Consider hemorrhoidal banding - suspect that                            those were cause of bleeding Gatha Mayer, MD 06/13/2018 3:09:54 PM This report has been signed electronically.

## 2018-06-13 NOTE — Progress Notes (Signed)
Pt's states no medical or surgical changes since previsit or office visit. 

## 2018-06-13 NOTE — Patient Instructions (Addendum)
I found and removed one polyp that looks benign. I do not think that was bleeding but think that the hemorrhoids you have were.  I can band those in the office if you like.  I will let you know pathology results and when to have another routine colonoscopy by mail and/or My Chart.  I appreciate the opportunity to care for you. Gatha Mayer, MD, FACG YOU HAD AN ENDOSCOPIC PROCEDURE TODAY AT [facility]:   Refer to the procedure report that was given to you for any specific questions about what was found during the examination.  If the procedure report does not answer your questions, please call your gastroenterologist to clarify.  If you requested that your care partner not be given the details of your procedure findings, then the procedure report has been included in a sealed envelope for you to review at your convenience later.  YOU SHOULD EXPECT: Some feelings of bloating in the abdomen. Passage of more gas than usual.  Walking can help get rid of the air that was put into your GI tract during the procedure and reduce the bloating. If you had a lower endoscopy (such as a colonoscopy or flexible sigmoidoscopy) you may notice spotting of blood in your stool or on the toilet paper. If you underwent a bowel prep for your procedure, you may not have a normal bowel movement for a few days.  Please Note:  You might notice some irritation and congestion in your nose or some drainage.  This is from the oxygen used during your procedure.  There is no need for concern and it should clear up in a day or so.  SYMPTOMS TO REPORT IMMEDIATELY:   Following lower endoscopy (colonoscopy or flexible sigmoidoscopy):  Excessive amounts of blood in the stool  Significant tenderness or worsening of abdominal pains  Swelling of the abdomen that is new, acute  Fever of 100F or higher   Following upper endoscopy (EGD)  Vomiting of blood or coffee ground material  New chest pain or pain  under the shoulder blades  Painful or persistently difficult swallowing  New shortness of breath  Fever of 100F or higher  Black, tarry-looking stools  For urgent or emergent issues, a gastroenterologist can be reached at any hour by calling [phone].   DIET:  We do recommend a small meal at first, but then you may proceed to your regular diet.  Drink plenty of fluids but you should avoid alcoholic beverages for 24 hours.  ACTIVITY:  You should plan to take it easy for the rest of today and you should NOT DRIVE or use heavy machinery until tomorrow (because of the sedation medicines used during the test).    FOLLOW UP: Our staff will call the number listed on your records the next business day following your procedure to check on you and address any questions or concerns that you may have regarding the information given to you following your procedure. If we do not reach you, we will leave a message.  However, if you are feeling well and you are not experiencing any problems, there is no need to return our call.  We will assume that you have returned to your regular daily activities without incident.  If any biopsies were taken you will be contacted by phone or by letter within the next 1-3 weeks.  Please call us at [phone] if you have not heard about the biopsies in 3 weeks.  SIGNATURES/CONFIDENTIALITY: You and/or your care partner have signed paperwork which will be entered into your electronic medical record.  These signatures attest to the fact that that the information above on your After Visit Summary has been reviewed and is understood.  Full responsibility of the confidentiality of this discharge information lies with you and/or your care-partner.  Polyp and hemorroid and hemorrhoid banding information given

## 2018-06-14 ENCOUNTER — Telehealth: Payer: Self-pay

## 2018-06-14 NOTE — Telephone Encounter (Signed)
  Follow up Call-  Call back number 06/13/2018 07/28/2017  Post procedure Call Back phone  # [phone]  Permission to leave phone message Yes Yes  Some recent data might be hidden     Patient questions:  Do you have a fever, pain , or abdominal swelling? No. Pain Score  0 *  Have you tolerated food without any problems? Yes.    Have you been able to return to your normal activities? Yes.  Do you have any questions about your discharge instructions: Diet   No. Medications  No. Follow up visit  No.  Do you have questions or concerns about your Care? No.  Actions: * If pain score is 4 or above: No action needed, pain <4.

## 2018-06-20 ENCOUNTER — Encounter: Payer: Self-pay | Admitting: Internal Medicine

## 2018-06-20 DIAGNOSIS — Z86010 Personal history of colonic polyps: Secondary | ICD-10-CM

## 2018-06-20 NOTE — Progress Notes (Signed)
10 mm adenoma Recall 2022

## 2018-06-30 ENCOUNTER — Other Ambulatory Visit: Payer: Self-pay | Admitting: Internal Medicine

## 2018-07-11 ENCOUNTER — Ambulatory Visit: Payer: BLUE CROSS/BLUE SHIELD | Admitting: Internal Medicine

## 2018-07-16 ENCOUNTER — Ambulatory Visit: Payer: Self-pay | Admitting: Radiation Oncology

## 2018-07-23 ENCOUNTER — Ambulatory Visit: Payer: BLUE CROSS/BLUE SHIELD | Attending: Radiation Oncology

## 2018-07-23 ENCOUNTER — Other Ambulatory Visit (HOSPITAL_COMMUNITY)
Admission: RE | Admit: 2018-07-23 | Discharge: 2018-07-23 | Disposition: A | Discharge status: Home / Self Care | Payer: BLUE CROSS/BLUE SHIELD | Source: Ambulatory Visit | Attending: Radiation Oncology | Admitting: Radiation Oncology

## 2018-07-23 ENCOUNTER — Other Ambulatory Visit: Payer: Self-pay

## 2018-07-23 ENCOUNTER — Ambulatory Visit
Admission: RE | Admit: 2018-07-23 | Discharge: 2018-07-23 | Disposition: A | Discharge status: Home / Self Care | Payer: BLUE CROSS/BLUE SHIELD | Source: Ambulatory Visit | Attending: Radiation Oncology | Admitting: Radiation Oncology

## 2018-07-23 ENCOUNTER — Encounter: Payer: Self-pay | Admitting: Radiation Oncology

## 2018-07-23 VITALS — BP 139/79 | HR 84 | Temp 98.3°F | Resp 20 | Ht 65.0 in | Wt 196.6 lb

## 2018-07-23 DIAGNOSIS — C541 Malignant neoplasm of endometrium: Secondary | ICD-10-CM

## 2018-07-23 DIAGNOSIS — Z01419 Encounter for gynecological examination (general) (routine) without abnormal findings: Secondary | ICD-10-CM | POA: Diagnosis not present

## 2018-07-23 DIAGNOSIS — Z8542 Personal history of malignant neoplasm of other parts of uterus: Secondary | ICD-10-CM | POA: Diagnosis not present

## 2018-07-23 DIAGNOSIS — Z79899 Other long term (current) drug therapy: Secondary | ICD-10-CM | POA: Diagnosis not present

## 2018-07-23 DIAGNOSIS — Z08 Encounter for follow-up examination after completed treatment for malignant neoplasm: Secondary | ICD-10-CM | POA: Diagnosis not present

## 2018-07-23 NOTE — Progress Notes (Signed)
Pt presents today for f/u with Dr. Sondra Come. Pt denies c/o pain. Pt denies fatigue. Pt denies dysuria/hematuria. Pt reports some rectal bleeding in July 2019 with colonoscopy. Pt reports that this bleeding was reported to be from hemorrhoids. Pt denies vaginal discharge/bleeding. Pt denies diarrhea/constipation. Pt denies N/V. Pt denies abdominal bloating.   BP 139/79 (BP Location: Left Arm, Patient Position: Sitting)   Pulse 84   Temp 98.3 F (36.8 C) (Oral)   Resp 20   Ht 5\' 5"  (1.651 m)   Wt 196 lb 9.6 oz (89.2 kg)   SpO2 95%   BMI 32.72 kg/m   Wt Readings from Last 3 Encounters:  07/23/18 196 lb 9.6 oz (89.2 kg)  06/13/18 196 lb (88.9 kg)  05/31/18 196 lb 3.2 oz (89 kg)   Loma Sousa, RN BSN

## 2018-07-23 NOTE — Progress Notes (Signed)
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  Narrative:  Jane Ruiz is here for follow up and reports that she is doing well overall. She notes not following up with gynecological oncology since last year.   On review of systems, she denies bloating, nausea, vomiting, pain, vaginal bleeding and cramping.  ALLERGIES:  has No Known Allergies.  Meds: Current Outpatient Medications  Medication Sig Dispense Refill  . atorvastatin (LIPITOR) 10 MG tablet Take 1 tablet (10 mg total) by mouth every evening. 90 tablet 3  . Biotin 5000 MCG CAPS Take 1 capsule by mouth daily.    . calcium carbonate (TUMS EX) 750 MG chewable tablet Chew 2 tablets by mouth 2 (two) times daily.    . Cholecalciferol (VITAMIN D3 PO) Take 25 mg by mouth.    . Multiple Vitamin (MULTIVITAMIN) tablet Take 1 tablet by mouth daily.     . Omega-3 Fatty Acids (FISH OIL) 1200 MG CAPS Take 1 capsule by mouth daily.    . pantoprazole (PROTONIX) 20 MG tablet Take 1 tablet (20 mg total) by mouth daily before breakfast. 90 tablet 3  . hydrocortisone (ANUSOL-HC) 2.5 % rectal cream Place 1 application rectally at bedtime. Use for 7 days. (Patient not taking: Reported on 06/13/2018) 30 g 1  . OVER THE COUNTER MEDICATION viviscal supplement     No current facility-administered medications for this encounter.    REVIEW OF SYSTEMS: A 10+ POINT REVIEW  OF SYSTEMS WAS OBTAINED including neurology, dermatology, psychiatry, cardiac, respiratory, lymph, extremities, GI, GU, musculoskeletal, constitutional, reproductive, HEENT. All pertinent positives are noted in the HPI. All others are negative.  Physical Findings:, Patient is alert and oriented x3 and is in no acute distress.  height is 5\' 5"  (1.651 m) and weight is 196 lb 9.6 oz (89.2 kg). Her oral temperature is 98.3 F (36.8 C). Her blood pressure is 139/79 and her pulse is 84. Her respiration is 20 and oxygen saturation is 95%.  The patient exhibited no palpable supraclavicular or axillary adenopathy. Her lungs are clear upon auscultation and the heart has a regular rhythm and rate. The abdomen is soft and nontender with normal bowel sounds.  On pelvic examination the external genitalia were unremarkable. A speculum exam was performed. There are no mucosal lesions noted in the vaginal vault. A Pap smear was obtained of the vaginal cuff region. On bimanual and rectovaginal examination there were no pelvic masses appreciated. Radiation changes noted at the vaginal cuff. Slight bleeding noted with the Pap smear.   Lab Findings: Lab Results  Component Value Date   WBC 8.6 03/14/2018   HGB 13.2 03/14/2018   HCT 39.8 03/14/2018   MCV 84.1 03/14/2018   PLT 327.0 03/14/2018    Radiographic Findings: No results found.   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 I congratulated  the patient on her being out 5 years since treatment with no recurrence.  Plan: Patient will return to her gynecologist, Dr. Larina Earthly, for gynecologic care. She will call to schedule an appointment with Dr. Valentino Saxon in the near future. PRN follow up with radiation oncology.   ____________________________________  Blair Promise, PhD, MD  This document serves as a record of services personally performed by Gery Pray,  MD. It was created on his behalf by Wilburn Mylar, a trained medical scribe. The creation of this record is based on the scribe's personal observations and the provider's statements to them. This document has been checked and approved by the attending provider.

## 2018-07-25 LAB — CYTOLOGY - PAP
Diagnosis: NEGATIVE
Diagnosis: REACTIVE

## 2018-08-03 ENCOUNTER — Encounter: Payer: Self-pay | Admitting: Family Medicine

## 2019-03-20 ENCOUNTER — Other Ambulatory Visit: Payer: BLUE CROSS/BLUE SHIELD

## 2019-03-25 ENCOUNTER — Encounter: Payer: BLUE CROSS/BLUE SHIELD | Admitting: Family Medicine

## 2019-04-03 ENCOUNTER — Telehealth: Payer: Self-pay | Admitting: Internal Medicine

## 2019-04-03 ENCOUNTER — Other Ambulatory Visit: Payer: Self-pay | Admitting: Family Medicine

## 2019-04-03 MED ORDER — PANTOPRAZOLE SODIUM 20 MG PO TBEC: 20.0000 mg | DELAYED_RELEASE_TABLET | Freq: Every day | ORAL | 2 refills | Status: DC

## 2019-04-03 NOTE — Telephone Encounter (Signed)
Patient would like to get a refill for pantoprazole (PROTONIX) 20 MG.  Pharmacy changed to :[address] [place], [place] [address]

## 2019-04-03 NOTE — Telephone Encounter (Signed)
Pantoprazole refilled as requested. 

## 2019-05-03 ENCOUNTER — Telehealth: Payer: Self-pay

## 2019-05-03 NOTE — Telephone Encounter (Signed)
error 

## 2019-05-05 ENCOUNTER — Telehealth: Payer: Self-pay | Admitting: Family Medicine

## 2019-05-05 DIAGNOSIS — E7800 Pure hypercholesterolemia, unspecified: Secondary | ICD-10-CM

## 2019-05-05 DIAGNOSIS — Z0000 Encounter for general adult medical examination without abnormal findings: Secondary | ICD-10-CM

## 2019-05-05 NOTE — Telephone Encounter (Signed)
-----   Message from Ellamae Sia sent at 05/02/2019  3:15 PM EDT ----- Regarding: Lab orders for Tuesday, 5.2.20 Lab orders

## 2019-05-07 ENCOUNTER — Other Ambulatory Visit (INDEPENDENT_AMBULATORY_CARE_PROVIDER_SITE_OTHER): Payer: BLUE CROSS/BLUE SHIELD

## 2019-05-07 DIAGNOSIS — E7800 Pure hypercholesterolemia, unspecified: Secondary | ICD-10-CM

## 2019-05-07 DIAGNOSIS — Z0000 Encounter for general adult medical examination without abnormal findings: Secondary | ICD-10-CM

## 2019-05-07 LAB — COMPREHENSIVE METABOLIC PANEL
ALT: 15 U/L (ref 0–35)
AST: 18 U/L (ref 0–37)
Albumin: 3.9 g/dL (ref 3.5–5.2)
Alkaline Phosphatase: 86 U/L (ref 39–117)
BUN: 13 mg/dL (ref 6–23)
CO2: 27 mEq/L (ref 19–32)
Calcium: 9.6 mg/dL (ref 8.4–10.5)
Chloride: 103 mEq/L (ref 96–112)
Creatinine, Ser: 0.79 mg/dL (ref 0.40–1.20)
GFR: 74.14 mL/min (ref >60.00)
Glucose, Bld: 89 mg/dL (ref 70–99)
Potassium: 3.9 mEq/L (ref 3.5–5.1)
Sodium: 138 mEq/L (ref 135–145)
Total Bilirubin: 0.5 mg/dL (ref 0.2–1.2)
Total Protein: 6.9 g/dL (ref 6.0–8.3)

## 2019-05-07 LAB — LIPID PANEL
Cholesterol: 145 mg/dL (ref 0–200)
HDL: 32.1 mg/dL — ABNORMAL LOW (ref >39.00)
LDL Cholesterol: 88 mg/dL (ref 0–99)
NonHDL: 113.06
Total CHOL/HDL Ratio: 5
Triglycerides: 125 mg/dL (ref 0.0–149.0)
VLDL: 25 mg/dL (ref 0.0–40.0)

## 2019-05-07 LAB — TSH: TSH: 4.68 u[IU]/mL — ABNORMAL HIGH (ref 0.35–4.50)

## 2019-05-07 LAB — CBC WITH DIFFERENTIAL/PLATELET
Basophils Absolute: 0.1 10*3/uL (ref 0.0–0.1)
Basophils Relative: 0.8 % (ref 0.0–3.0)
Eosinophils Absolute: 0.4 10*3/uL (ref 0.0–0.7)
Eosinophils Relative: 4.2 % (ref 0.0–5.0)
HCT: 38.8 % (ref 36.0–46.0)
Hemoglobin: 13 g/dL (ref 12.0–15.0)
Lymphocytes Relative: 14.1 % (ref 12.0–46.0)
Lymphs Abs: 1.3 10*3/uL (ref 0.7–4.0)
MCHC: 33.5 g/dL (ref 30.0–36.0)
MCV: 83.7 fl (ref 78.0–100.0)
Monocytes Absolute: 0.8 10*3/uL (ref 0.1–1.0)
Monocytes Relative: 8.5 % (ref 3.0–12.0)
Neutro Abs: 6.6 10*3/uL (ref 1.4–7.7)
Neutrophils Relative %: 72.4 % (ref 43.0–77.0)
Platelets: 316 10*3/uL (ref 150.0–400.0)
RBC: 4.63 Mil/uL (ref 3.87–5.11)
RDW: 14.8 % (ref 11.5–15.5)
WBC: 9.1 10*3/uL (ref 4.0–10.5)

## 2019-05-16 ENCOUNTER — Ambulatory Visit (INDEPENDENT_AMBULATORY_CARE_PROVIDER_SITE_OTHER): Payer: BLUE CROSS/BLUE SHIELD | Admitting: Family Medicine

## 2019-05-16 ENCOUNTER — Encounter: Payer: Self-pay | Admitting: Family Medicine

## 2019-05-16 ENCOUNTER — Ambulatory Visit (INDEPENDENT_AMBULATORY_CARE_PROVIDER_SITE_OTHER): Payer: BLUE CROSS/BLUE SHIELD

## 2019-05-16 ENCOUNTER — Other Ambulatory Visit: Payer: Self-pay

## 2019-05-16 ENCOUNTER — Telehealth: Payer: Self-pay | Admitting: Family Medicine

## 2019-05-16 VITALS — BP 124/82 | HR 91 | Temp 98.4°F | Ht 64.5 in | Wt 195.4 lb

## 2019-05-16 DIAGNOSIS — Z6832 Body mass index (BMI) 32.0-32.9, adult: Secondary | ICD-10-CM

## 2019-05-16 DIAGNOSIS — E6609 Other obesity due to excess calories: Secondary | ICD-10-CM

## 2019-05-16 DIAGNOSIS — Z86010 Personal history of colonic polyps: Secondary | ICD-10-CM

## 2019-05-16 DIAGNOSIS — E7800 Pure hypercholesterolemia, unspecified: Secondary | ICD-10-CM | POA: Diagnosis not present

## 2019-05-16 DIAGNOSIS — C541 Malignant neoplasm of endometrium: Secondary | ICD-10-CM | POA: Diagnosis not present

## 2019-05-16 DIAGNOSIS — R7989 Other specified abnormal findings of blood chemistry: Secondary | ICD-10-CM

## 2019-05-16 DIAGNOSIS — Z23 Encounter for immunization: Secondary | ICD-10-CM | POA: Diagnosis not present

## 2019-05-16 DIAGNOSIS — Z0000 Encounter for general adult medical examination without abnormal findings: Secondary | ICD-10-CM

## 2019-05-16 MED ORDER — ATORVASTATIN CALCIUM 10 MG PO TABS: 10.0000 mg | ORAL_TABLET | Freq: Every evening | ORAL | 3 refills | Status: DC

## 2019-05-16 NOTE — Progress Notes (Signed)
Subjective:    Patient ID: Jane Ruiz, female    DOB: Jul 07, 1961, 58 y.o.   MRN: [id number]  HPI Here for health maintenance exam and to review chronic medical problems    She is working in a church- pretty much by herself  Feeling ok  Has been careful with wearing masks/ etc    Weight  Wt Readings from Last 3 Encounters:  05/16/19 195 lb 6 oz (88.6 kg)  07/23/18 196 lb 9.6 oz (89.2 kg)  06/13/18 196 lb (88.9 kg)  stable  33.02 kg/m   Taking fair care of herself  She has not been exercising  She is too tired when she gets home  Is open to doing it in the am   She was drinking some lemon juice and apple cider vinegar May bother her reflux , HH  Has derm appt upcoming- has mole on her L thigh and back  She is not in the sun / tends to avoid it    BP Readings from Last 3 Encounters:  05/16/19 124/82  07/23/18 139/79  06/13/18 130/72   Pulse Readings from Last 3 Encounters:  05/16/19 91  07/23/18 84  06/13/18 81   Diet :  Eating healthy  Avoids junk  Avoids bread  Eats sweet potato instead of white  Does not snack  Lot of fruits /veg-has a garden    Flu vaccine - last fall /[facility] in oct  Tetanus vaccine 4/19  Interested in shingrix -will do first one today/ins covers   Mammogram 8/19  Self breast exam : no lumps or changes   Has had a hysterectomy in the past for endometrial cancer  Pap (vaginal) neg 8/19 at oncologist  Was released from the cancer center   Colonoscopy 7/19  3 year recall   Hyperlipidemia Lab Results  Component Value Date   CHOL 145 05/07/2019   CHOL 161 03/14/2018   CHOL 167 03/06/2017   Lab Results  Component Value Date   HDL 32.10 (L) 05/07/2019   HDL 36.50 (L) 03/14/2018   HDL 35.10 (L) 03/06/2017   Lab Results  Component Value Date   LDLCALC 88 05/07/2019   LDLCALC 91 03/14/2018   LDLCALC 103 (H) 03/06/2017   Lab Results  Component Value Date   TRIG 125.0 05/07/2019   TRIG 171.0 (H) 03/14/2018   TRIG  142.0 03/06/2017   Lab Results  Component Value Date   CHOLHDL 5 05/07/2019   CHOLHDL 4 03/14/2018   CHOLHDL 5 03/06/2017   Lab Results  Component Value Date   LDLDIRECT 172.4 12/24/2010   LDLDIRECT 174.9 09/17/2010   LDLDIRECT 164.2 07/06/2010   Atorvastatin and diet  HDL is down   Lab Results  Component Value Date   CREATININE 0.79 05/07/2019   BUN 13 05/07/2019   NA 138 05/07/2019   K 3.9 05/07/2019   CL 103 05/07/2019   CO2 27 05/07/2019   Lab Results  Component Value Date   ALT 15 05/07/2019   AST 18 05/07/2019   ALKPHOS 86 05/07/2019   BILITOT 0.5 05/07/2019   Lab Results  Component Value Date   WBC 9.1 05/07/2019   HGB 13.0 05/07/2019   HCT 38.8 05/07/2019   MCV 83.7 05/07/2019   PLT 316.0 05/07/2019   Lab Results  Component Value Date   TSH 4.68 (H) 05/07/2019    No goiter or enl thyroid  No change in energy/hair or skin   Patient Active Problem List   Diagnosis  Date Noted  . Bleeding internal hemorrhoids 06/13/2018  . Hx of adenomatous polyp of colon 06/13/2018  . Elevated TSH 01/04/2016  . Endometrial cancer ([place]) 02/12/2013  . Other screening mammogram 11/04/2011  . Obesity 11/04/2011  . Routine general medical examination at a health care facility 10/20/2011  . PLANTAR FASCIITIS 12/31/2010  . Hyperlipidemia 09/24/2010  . MENOPAUSAL SYNDROME 07/06/2010   Past Medical History:  Diagnosis Date  . endometrial ca 02/12/13   endometrial, positive nodes  . External hemorrhoids   . History of radiation therapy 05/27/2013-07/01/2013   pelvis 45 gray in 25 fractions, 63 gray with high dose brachytherapy  . Hx of adenomatous polyp of colon 06/13/2018  . Hyperlipidemia    borderline high cholesterol  . Hyperplastic colon polyp   . Internal hemorrhoids   . Migraine   . Skin lesion    non maligant skin lesions on head   Past Surgical History:  Procedure Laterality Date  . ABDOMINAL HYSTERECTOMY  02/12/13  . COLONOSCOPY W/ BIOPSIES  06/15/2009    Dr Watt Climes - distal hyperplastic polyps  . RHINOPLASTY     breathing problems- Dr Ernesto Rutherford  . ROBOTIC ASSISTED TOTAL HYSTERECTOMY WITH BILATERAL SALPINGO OOPHERECTOMY N/A 02/12/2013   Procedure: ROBOTIC ASSISTED TOTAL HYSTERECTOMY WITH BILATERAL SALPINGO OOPHORECTOMY,  LYMPH NODES;  Surgeon: Imagene Gurney A. Alycia Rossetti, MD;  Location: [facility];  Service: Gynecology;  Laterality: N/A;   Social History   Tobacco Use  . Smoking status: Never Smoker  . Smokeless tobacco: Never Used  Substance Use Topics  . Alcohol use: No    Alcohol/week: 0.0 standard drinks  . Drug use: No   Family History  Problem Relation Age of Onset  . Diabetes Mother   . Kidney disease Mother   . Benign prostatic hyperplasia Father   . Diabetes Maternal Grandmother   . Colon cancer Neg Hx    No Known Allergies Current Outpatient Medications on File Prior to Visit  Medication Sig Dispense Refill  . APPLE CIDER VINEGAR PO Take by mouth daily.    . Biotin 5000 MCG CAPS Take 1 capsule by mouth daily.    . calcium carbonate (TUMS EX) 750 MG chewable tablet Chew 2 tablets by mouth 2 (two) times daily.    . Cholecalciferol (VITAMIN D3 PO) Take 25 mg by mouth.    . hydrocortisone (ANUSOL-HC) 2.5 % rectal cream Place 1 application rectally at bedtime. Use for 7 days. 30 g 1  . Multiple Vitamin (MULTIVITAMIN) tablet Take 1 tablet by mouth daily.     . Omega-3 Fatty Acids (FISH OIL) 1200 MG CAPS Take 1 capsule by mouth daily.    . pantoprazole (PROTONIX) 20 MG tablet Take 1 tablet (20 mg total) by mouth daily before breakfast. 90 tablet 2   No current facility-administered medications on file prior to visit.     Review of Systems  Constitutional: Negative for activity change, appetite change, fatigue, fever and unexpected weight change.  HENT: Negative for congestion, ear pain, rhinorrhea, sinus pressure and sore throat.   Eyes: Negative for pain, redness and visual disturbance.  Respiratory: Negative for cough, shortness of breath  and wheezing.   Cardiovascular: Negative for chest pain and palpitations.  Gastrointestinal: Negative for abdominal pain, blood in stool, constipation and diarrhea.  Endocrine: Negative for polydipsia and polyuria.  Genitourinary: Negative for dysuria, frequency and urgency.  Musculoskeletal: Negative for arthralgias, back pain and myalgias.  Skin: Negative for pallor and rash.  Allergic/Immunologic: Negative for environmental allergies.  Neurological: Negative  for dizziness, syncope and headaches.  Hematological: Negative for adenopathy. Does not bruise/bleed easily.  Psychiatric/Behavioral: Negative for decreased concentration and dysphoric mood. The patient is not nervous/anxious.        Objective:   Physical Exam Constitutional:      General: She is not in acute distress.    Appearance: Normal appearance. She is well-developed. She is obese. She is not ill-appearing.  HENT:     Head: Normocephalic and atraumatic.     Right Ear: Tympanic membrane, ear canal and external ear normal.     Left Ear: Tympanic membrane, ear canal and external ear normal.     Nose: Nose normal.     Mouth/Throat:     Mouth: Mucous membranes are moist.     Pharynx: Oropharynx is clear. No posterior oropharyngeal erythema.  Eyes:     General: No scleral icterus.    Conjunctiva/sclera: Conjunctivae normal.     Pupils: Pupils are equal, round, and reactive to light.  Neck:     Musculoskeletal: Normal range of motion and neck supple. No muscular tenderness.     Thyroid: No thyromegaly.     Vascular: No carotid bruit or JVD.  Cardiovascular:     Rate and Rhythm: Normal rate and regular rhythm.     Pulses: Normal pulses.     Heart sounds: Normal heart sounds. No gallop.   Pulmonary:     Effort: Pulmonary effort is normal. No respiratory distress.     Breath sounds: Normal breath sounds. No wheezing.  Chest:     Chest wall: No tenderness.  Abdominal:     General: Bowel sounds are normal. There is no  distension or abdominal bruit.     Palpations: Abdomen is soft. There is no mass.     Tenderness: There is no abdominal tenderness.  Genitourinary:    Comments: Breast exam: No mass, nodules, thickening, tenderness, bulging, retraction, inflamation, nipple discharge or skin changes noted.  No axillary or clavicular LA.     Musculoskeletal: Normal range of motion.        General: No tenderness.  Lymphadenopathy:     Cervical: No cervical adenopathy.  Skin:    General: Skin is warm and dry.     Coloration: Skin is not pale.     Findings: No erythema or rash.     Comments: Multiple SKs - back/ right breast and R thigh Some lentigines  Neurological:     Mental Status: She is alert. Mental status is at baseline.     Cranial Nerves: No cranial nerve deficit.     Motor: No abnormal muscle tone.     Coordination: Coordination normal.     Deep Tendon Reflexes: Reflexes are normal and symmetric. Reflexes normal.  Psychiatric:        Mood and Affect: Mood normal.           Assessment & Plan:   Problem List Items Addressed This Visit      Genitourinary   Endometrial cancer ([place])    Doing well -and released from the cancer center this year Neg pap 8/19          Other   Hyperlipidemia    Disc goals for lipids and reasons to control them Rev last labs with pt Rev low sat fat diet in detail  Good LDL but HDL is low Disc starting an exercise routine         Relevant Medications   atorvastatin (LIPITOR) 10 MG tablet  Routine general medical examination at a health care facility - Primary    Reviewed health habits including diet and exercise and skin cancer prevention Reviewed appropriate screening tests for age  Also reviewed health mt list, fam hx and immunization status , as well as social and family history    See HPI Labs reviewed Pt will schedule mammogram for august if possible Disc exercise plan Disc safety during pandemic  Will watch thyroid       Obesity     Discussed how this problem influences overall health and the risks it imposes  Reviewed plan for weight loss with lower calorie diet (via better food choices and also portion control or program like weight watchers) and exercise building up to or more than 30 minutes 5 days per week including some aerobic activity         Elevated TSH    This has been up and down in the past  Lab Results  Component Value Date   TSH 4.68 (H) 05/07/2019   No symptoms or clinical changes May become hypothyroid in the future-discussed this  Will continue to monitor      Hx of adenomatous polyp of colon

## 2019-05-16 NOTE — Assessment & Plan Note (Signed)
Doing well -and released from the cancer center this year Neg pap 8/19

## 2019-05-16 NOTE — Assessment & Plan Note (Signed)
Colonoscopy 7/19 with 3 y recall

## 2019-05-16 NOTE — Assessment & Plan Note (Signed)
Reviewed health habits including diet and exercise and skin cancer prevention Reviewed appropriate screening tests for age  Also reviewed health mt list, fam hx and immunization status , as well as social and family history    See HPI Labs reviewed Pt will schedule mammogram for august if possible Disc exercise plan Disc safety during pandemic  Will watch thyroid

## 2019-05-16 NOTE — Assessment & Plan Note (Signed)
Disc goals for lipids and reasons to control them Rev last labs with pt Rev low sat fat diet in detail  Good LDL but HDL is low Disc starting an exercise routine

## 2019-05-16 NOTE — Telephone Encounter (Signed)
Dr.Tower told patient to come up to desk and ask for [facility].  Patient needs the shingles shot.  I let patient know that she would be put on a wait list and contacted with appointment.

## 2019-05-16 NOTE — Telephone Encounter (Signed)
Jane Ruiz was not aware that we were going to go ahead and administer patient's dose while here for her CPE and she gave her the routine protocol response.    Patient and I missed each other by mere minutes and I deeply apologize and have been trying to contact patient since she left office to see if we can get her back in here for her administration.  I have left message on patient's mobile number to please call back to get her back in.

## 2019-05-16 NOTE — Assessment & Plan Note (Signed)
This has been up and down in the past  Lab Results  Component Value Date   TSH 4.68 (H) 05/07/2019   No symptoms or clinical changes May become hypothyroid in the future-discussed this  Will continue to monitor

## 2019-05-16 NOTE — Assessment & Plan Note (Signed)
Discussed how this problem influences overall health and the risks it imposes  Reviewed plan for weight loss with lower calorie diet (via better food choices and also portion control or program like weight watchers) and exercise building up to or more than 30 minutes 5 days per week including some aerobic activity    

## 2019-05-16 NOTE — Patient Instructions (Addendum)
Start exercising in the am before work - 30 or more minutes a day   We will do your shingrix vaccine today   Labs look ok  Keep watching diet for fat and sugar   We will watch your thyroid over time

## 2019-06-18 DIAGNOSIS — L820 Inflamed seborrheic keratosis: Secondary | ICD-10-CM | POA: Diagnosis not present

## 2019-06-18 DIAGNOSIS — D225 Melanocytic nevi of trunk: Secondary | ICD-10-CM | POA: Diagnosis not present

## 2019-06-18 DIAGNOSIS — L821 Other seborrheic keratosis: Secondary | ICD-10-CM | POA: Diagnosis not present

## 2019-08-06 ENCOUNTER — Ambulatory Visit (INDEPENDENT_AMBULATORY_CARE_PROVIDER_SITE_OTHER): Payer: BC Managed Care – PPO

## 2019-08-06 DIAGNOSIS — Z23 Encounter for immunization: Secondary | ICD-10-CM | POA: Diagnosis not present

## 2019-08-15 ENCOUNTER — Encounter: Payer: Self-pay | Admitting: Family Medicine

## 2019-08-15 DIAGNOSIS — Z1231 Encounter for screening mammogram for malignant neoplasm of breast: Secondary | ICD-10-CM | POA: Diagnosis not present

## 2019-08-16 ENCOUNTER — Ambulatory Visit (INDEPENDENT_AMBULATORY_CARE_PROVIDER_SITE_OTHER): Payer: BC Managed Care – PPO

## 2019-08-16 DIAGNOSIS — Z23 Encounter for immunization: Secondary | ICD-10-CM | POA: Diagnosis not present

## 2019-11-18 DIAGNOSIS — Z01419 Encounter for gynecological examination (general) (routine) without abnormal findings: Secondary | ICD-10-CM | POA: Diagnosis not present

## 2019-11-18 DIAGNOSIS — Z6833 Body mass index (BMI) 33.0-33.9, adult: Secondary | ICD-10-CM | POA: Diagnosis not present

## 2019-11-18 DIAGNOSIS — Z124 Encounter for screening for malignant neoplasm of cervix: Secondary | ICD-10-CM | POA: Diagnosis not present

## 2019-11-18 DIAGNOSIS — Z1151 Encounter for screening for human papillomavirus (HPV): Secondary | ICD-10-CM | POA: Diagnosis not present

## 2020-02-25 ENCOUNTER — Other Ambulatory Visit: Payer: Self-pay | Admitting: Internal Medicine

## 2020-03-05 DIAGNOSIS — U071 COVID-19: Secondary | ICD-10-CM

## 2020-03-05 HISTORY — DX: COVID-19: U07.1

## 2020-04-09 ENCOUNTER — Telehealth (INDEPENDENT_AMBULATORY_CARE_PROVIDER_SITE_OTHER): Payer: BC Managed Care – PPO | Admitting: Family Medicine

## 2020-04-09 ENCOUNTER — Encounter: Payer: Self-pay | Admitting: Family Medicine

## 2020-04-09 ENCOUNTER — Other Ambulatory Visit: Payer: Self-pay

## 2020-04-09 DIAGNOSIS — R5381 Other malaise: Secondary | ICD-10-CM | POA: Diagnosis not present

## 2020-04-09 DIAGNOSIS — R5383 Other fatigue: Secondary | ICD-10-CM | POA: Diagnosis not present

## 2020-04-09 NOTE — Assessment & Plan Note (Signed)
With lack of appetite since last weekend  No nausea/but has had some mild loose stool Advised she get tested for covid and call us with a result (pharmacy or [facility] site-info given) Rest and isolate Fluids  Bland diet/snacks if able to eat  Watch for fever/headache/respiratory symptoms or other changes and please call with update

## 2020-04-09 NOTE — Progress Notes (Signed)
Virtual Visit via Telephone Note  I connected with Jane Ruiz on 04/09/20 at  9:00 AM EDT by telephone and verified that I am speaking with the correct person using two identifiers.  Location: Patient: home Provider: office    I discussed the limitations, risks, security and privacy concerns of performing an evaluation and management service by telephone and the availability of in person appointments. I also discussed with the patient that there may be a patient responsible charge related to this service. The patient expressed understanding and agreed to proceed.  Parties involved in encounter  Patient: Jane Ruiz  Provider:  Loura Pardon MD     History of Present Illness: Pt presents with c/o body aches with decreased appetite  Last Friday started feeling poorly  No appetite  Mild sore throat   No chills  ? Body aches  No sweats   No nausea  No vomiting  BMs have been more loose than usual   No nasal symptoms  No cough   No sick contacts known Works at [employer] by herself   No loss of taste No fever  Has not been immunized for covid  Has not had covid   Took some tylenol this am- thinks it helped some   Last temp 97.5   Is worried about being sick  Otherwise   Patient Active Problem List   Diagnosis Date Noted  . Malaise and fatigue 04/09/2020  . Bleeding internal hemorrhoids 06/13/2018  . Hx of adenomatous polyp of colon 06/13/2018  . Elevated TSH 01/04/2016  . History of endometrial cancer 02/12/2013  . Other screening mammogram 11/04/2011  . Obesity 11/04/2011  . Routine general medical examination at a health care facility 10/20/2011  . PLANTAR FASCIITIS 12/31/2010  . Hyperlipidemia 09/24/2010  . MENOPAUSAL SYNDROME 07/06/2010   Past Medical History:  Diagnosis Date  . endometrial ca 02/12/13   endometrial, positive nodes  . External hemorrhoids   . History of radiation therapy 05/27/2013-07/01/2013   pelvis 45 gray in 25 fractions, 63 gray  with high dose brachytherapy  . Hx of adenomatous polyp of colon 06/13/2018  . Hyperlipidemia    borderline high cholesterol  . Hyperplastic colon polyp   . Internal hemorrhoids   . Migraine   . Skin lesion    non maligant skin lesions on head   Past Surgical History:  Procedure Laterality Date  . ABDOMINAL HYSTERECTOMY  02/12/13  . COLONOSCOPY W/ BIOPSIES  06/15/2009   Dr Watt Climes - distal hyperplastic polyps  . RHINOPLASTY     breathing problems- Dr Ernesto Rutherford  . ROBOTIC ASSISTED TOTAL HYSTERECTOMY WITH BILATERAL SALPINGO OOPHERECTOMY N/A 02/12/2013   Procedure: ROBOTIC ASSISTED TOTAL HYSTERECTOMY WITH BILATERAL SALPINGO OOPHORECTOMY,  LYMPH NODES;  Surgeon: Imagene Gurney A. Alycia Rossetti, MD;  Location: [facility];  Service: Gynecology;  Laterality: N/A;   Social History   Tobacco Use  . Smoking status: Never Smoker  . Smokeless tobacco: Never Used  Substance Use Topics  . Alcohol use: No    Alcohol/week: 0.0 standard drinks  . Drug use: No   Family History  Problem Relation Age of Onset  . Diabetes Mother   . Kidney disease Mother   . Benign prostatic hyperplasia Father   . Diabetes Maternal Grandmother   . Colon cancer Neg Hx    No Known Allergies Current Outpatient Medications on File Prior to Visit  Medication Sig Dispense Refill  . APPLE CIDER VINEGAR PO Take by mouth daily.    Marland Kitchen atorvastatin (LIPITOR) 10  MG tablet Take 1 tablet (10 mg total) by mouth every evening. 90 tablet 3  . Biotin 5000 MCG CAPS Take 1 capsule by mouth daily.    . calcium carbonate (TUMS EX) 750 MG chewable tablet Chew 2 tablets by mouth 2 (two) times daily.    . Cholecalciferol (VITAMIN D3 PO) Take 25 mg by mouth.    . hydrocortisone (ANUSOL-HC) 2.5 % rectal cream Place 1 application rectally at bedtime. Use for 7 days. 30 g 1  . Multiple Vitamin (MULTIVITAMIN) tablet Take 1 tablet by mouth daily.     . Omega-3 Fatty Acids (FISH OIL) 1200 MG CAPS Take 1 capsule by mouth daily.    . pantoprazole (PROTONIX) 20 MG  tablet TAKE 1 TABLET BY MOUTH DAILY BEFORE BREAKFAST 90 tablet 0   No current facility-administered medications on file prior to visit.   Review of Systems  Constitutional: Positive for malaise/fatigue. Negative for chills, diaphoresis and fever.       Decreased appetite  HENT: Positive for sore throat. Negative for congestion, ear pain and sinus pain.   Eyes: Negative for blurred vision, discharge and redness.  Respiratory: Negative for cough, sputum production, shortness of breath, wheezing and stridor.   Cardiovascular: Negative for chest pain, palpitations and leg swelling.  Gastrointestinal: Positive for diarrhea. Negative for abdominal pain, blood in stool, constipation, melena, nausea and vomiting.  Musculoskeletal: Negative for myalgias.  Skin: Negative for rash.  Neurological: Negative for dizziness and headaches.    Observations/Objective: Pt sounds tired and anxious Tearful affect-voices being worried about her symptoms  Nl cognition-good historian  Not hoarse or nasal sounding No cough or sob during the interview   Assessment and Plan: Problem List Items Addressed This Visit      Other   Malaise and fatigue    With lack of appetite since last weekend  No nausea/but has had some mild loose stool Advised she get tested for covid and call us with a result (pharmacy or [facility] site-info given) Rest and isolate Fluids  Bland diet/snacks if able to eat  Watch for fever/headache/respiratory symptoms or other changes and please call with update          Follow Up Instructions: Get tested for covid- does not matter where  Call us with a result  Drink fluids and rest and isolate yourself  Treat symptoms (tylenol is helpful)  Keep check on temperature  Watch for headache/ respiratory symptoms or other changes Update if not starting to improve in a week or if worsening     I discussed the assessment and treatment plan with the patient. The patient was provided an  opportunity to ask questions and all were answered. The patient agreed with the plan and demonstrated an understanding of the instructions.   The patient was advised to call back or seek an in-person evaluation if the symptoms worsen or if the condition fails to improve as anticipated.  I provided 13 minutes of non-face-to-face time during this encounter.   Loura Pardon, MD

## 2020-04-09 NOTE — Patient Instructions (Signed)
Get tested for covid- does not matter where  Call us with a result  Drink fluids and rest and isolate yourself  Treat symptoms (tylenol is helpful)  Keep check on temperature  Watch for headache/ respiratory symptoms or other changes Update if not starting to improve in a week or if worsening

## 2020-04-13 ENCOUNTER — Telehealth (INDEPENDENT_AMBULATORY_CARE_PROVIDER_SITE_OTHER): Payer: BC Managed Care – PPO | Admitting: Family Medicine

## 2020-04-13 ENCOUNTER — Telehealth: Payer: Self-pay

## 2020-04-13 ENCOUNTER — Encounter: Payer: Self-pay | Admitting: Family Medicine

## 2020-04-13 DIAGNOSIS — U071 COVID-19: Secondary | ICD-10-CM | POA: Diagnosis not present

## 2020-04-13 NOTE — Telephone Encounter (Signed)
Pt had a VV with PCP today.

## 2020-04-13 NOTE — Assessment & Plan Note (Signed)
Symptoms started about 9 days ago- ? How she got it  Test positive from Friday  Isolating (husb considering testing)  Symptoms are relatively mild/malaise and chills and slt cough Recommended 2 tylenol (reg) up to every 4-6 h  If needed-can add ibuprofen (with food) Watch temp  If cough worsens- will alert [place]  Update if not starting to improve in a week or if worsening

## 2020-04-13 NOTE — Telephone Encounter (Signed)
Error

## 2020-04-13 NOTE — Patient Instructions (Signed)
Drink fluids and rest  2 regular strength tylenol are ok to take every 4-6 hours as needed for chills/pain and malaise Stay isolated until symptoms are much better I encourage your husband to get tested   Let anyone know who you had contact with shortly before getting sick   Update if not starting to improve in a week or if worsening   If short or breath or wheezing please alert me

## 2020-04-13 NOTE — Telephone Encounter (Signed)
[facility] Night - Client TELEPHONE ADVICE RECORD Access[facility]urse Patient Name: WYNELL BRIGANCE Gender: Female DOB: 06-Feb-1961 Age: 59 Y 85 M 27 D Return Phone Number: [phone] (Primary), [phone] (Secondary) Address: [place] [address] Client [facility] Night - Client Client Site [place] Physician Tower, Roque Lias - MD Contact Type Call Who Is Calling Patient / Member / Family / Caregiver Call Type Triage / Clinical Relationship To Patient Self Return Phone Number [phone] (Primary) Chief Complaint Immunization Reaction Reason for Call Symptomatic / Request for [place] states she had a positive covid test today and she wants to know if she can get a prescription. She hasn't been feeling good since last week. Translation No Nurse Assessment Nurse: Raenette Rover, RN, Zella Ball Date/Time (Eastern Time): 04/11/2020 9:44:15 PM Confirm and document reason for call. If symptomatic, describe symptoms. ---Positive Covid test on Friday. Not feeling good. chills at time but no fever at present. Has the patient had close contact with a person known or suspected to have the novel coronavirus illness OR traveled / lives in area with major community spread (including international travel) in the last 14 days from the onset of symptoms? * If Asymptomatic, screen for exposure and travel within the last 14 days. ---Yes Does the patient have any new or worsening symptoms? ---Yes Will a triage be completed? ---Yes Related visit to physician within the last 2 weeks? ---No Does the PT have any chronic conditions? (i.e. diabetes, asthma, this includes High risk factors for pregnancy, etc.) ---No Is this a behavioral health or substance abuse call? ---No Guidelines Guideline Title Affirmed Question Affirmed Notes Nurse Date/Time (Eastern Time) COVID-19 - Diagnosed or  Suspected [1] COVID-19 infection suspected by caller or triager AND [2] mild symptoms (cough, fever, or others) AND [id number] no complications or SOB Raenette Rover, RN, Zella Ball 04/11/2020 9:46:38 PM Disp. Time (Eastern Time) Disposition Final UserPLEASE NOTE: All timestamps contained within this report are represented as [place] Standard Time. CONFIDENTIALTY NOTICE: This fax transmission is intended only for the addressee. It contains information that is legally privileged, confidential or otherwise protected from use or disclosure. If you are not the intended recipient, you are strictly prohibited from reviewing, disclosing, copying using or disseminating any of this information or taking any action in reliance on or regarding this information. If you have received this fax in error, please notify us immediately by telephone so that we can arrange for its return to [place]. Phone: [phone], Toll-Free: [phone], Fax: [phone] Page: 2 of 2 Call Id: [id number] 04/11/2020 9:51:26 PM Call PCP when Office is Open Yes Raenette Rover, RN, Herbert Deaner Disagree/Comply Comply Caller Understands Yes PreDisposition Call Doctor Care Advice Given Per Guideline CALL PCP WHEN OFFICE IS OPEN: HUMIDIFIER: * If the air is dry, use a humidifier in the bedroom. * Dry air makes coughs worse. * ACETAMINOPHEN REGULAR STRENGTH TYLENOL: Take 650 mg (two 325 mg pills) by mouth every 4 to 6 hours as needed. Each Regular Strength Tylenol pill has 325 mg of acetaminophen. The most you should take each day is 3,250 mg (10 pills a day). CALL BACK IF: * You become worse. * Chest pain or difficulty breathing occurs * Fever returns after being gone for 24 hours * Fever lasts over 3 days * Fever over 103 F (39.4 C) CARE ADVICE given per COVID-19 - DIAGNOSED OR SUSPECTED (Adult) guideline. Referrals REFERRED TO PCP OFFICE

## 2020-04-13 NOTE — Progress Notes (Signed)
Virtual Visit via Telephone Note  I connected with Jane Ruiz on 04/13/20 at  9:00 AM EDT by telephone and verified that I am speaking with the correct person using two identifiers.  Location: Patient: home Provider: office    I discussed the limitations, risks, security and privacy concerns of performing an evaluation and management service by telephone and the availability of in person appointments. I also discussed with the patient that there may be a patient responsible charge related to this service. The patient expressed understanding and agreed to proceed.  Parties involved in encounter  Patient: Jane Ruiz  Husband : Daryll Brod   Provider:  Loura Pardon MD    This is a phone encounter  History of Present Illness:   pt was seen on 5/6 for malaise /lack of appetite  She was advised to get tested for covid and call us with result   She tested positive at [facility] (they called her on Saturday)  Test was Friday   Symptoms: Fatigue/no energy  Cough - little to no prod, clear  Chills occasionally   Feels bad in general   No wheeze or sob  Some headache on and ovv   Ears /throat feel ok   No nausea or vomiting  Appetite is still low (occ toast or jello)   OTC: taking tylenol (one pill every 4-5 hours)  Takes her vitamins   She has some ibuprofen at home- has not taken  First day of symptoms was about 9 days ago  Patient Active Problem List   Diagnosis Date Noted  . COVID-19 04/13/2020  . Malaise and fatigue 04/09/2020  . Bleeding internal hemorrhoids 06/13/2018  . Hx of adenomatous polyp of colon 06/13/2018  . Elevated TSH 01/04/2016  . History of endometrial cancer 02/12/2013  . Other screening mammogram 11/04/2011  . Obesity 11/04/2011  . Routine general medical examination at a health care facility 10/20/2011  . PLANTAR FASCIITIS 12/31/2010  . Hyperlipidemia 09/24/2010  . MENOPAUSAL SYNDROME 07/06/2010   Past Medical History:  Diagnosis Date   . endometrial ca 02/12/13   endometrial, positive nodes  . External hemorrhoids   . History of radiation therapy 05/27/2013-07/01/2013   pelvis 45 gray in 25 fractions, 63 gray with high dose brachytherapy  . Hx of adenomatous polyp of colon 06/13/2018  . Hyperlipidemia    borderline high cholesterol  . Hyperplastic colon polyp   . Internal hemorrhoids   . Migraine   . Skin lesion    non maligant skin lesions on head   Past Surgical History:  Procedure Laterality Date  . ABDOMINAL HYSTERECTOMY  02/12/13  . COLONOSCOPY W/ BIOPSIES  06/15/2009   Dr Watt Climes - distal hyperplastic polyps  . RHINOPLASTY     breathing problems- Dr Ernesto Rutherford  . ROBOTIC ASSISTED TOTAL HYSTERECTOMY WITH BILATERAL SALPINGO OOPHERECTOMY N/A 02/12/2013   Procedure: ROBOTIC ASSISTED TOTAL HYSTERECTOMY WITH BILATERAL SALPINGO OOPHORECTOMY,  LYMPH NODES;  Surgeon: Imagene Gurney A. Alycia Rossetti, MD;  Location: [facility];  Service: Gynecology;  Laterality: N/A;   Social History   Tobacco Use  . Smoking status: Never Smoker  . Smokeless tobacco: Never Used  Substance Use Topics  . Alcohol use: No    Alcohol/week: 0.0 standard drinks  . Drug use: No   Family History  Problem Relation Age of Onset  . Diabetes Mother   . Kidney disease Mother   . Benign prostatic hyperplasia Father   . Diabetes Maternal Grandmother   . Colon cancer Neg Hx  No Known Allergies Current Outpatient Medications on File Prior to Visit  Medication Sig Dispense Refill  . APPLE CIDER VINEGAR PO Take by mouth daily.    Marland Kitchen atorvastatin (LIPITOR) 10 MG tablet Take 1 tablet (10 mg total) by mouth every evening. 90 tablet 3  . Biotin 5000 MCG CAPS Take 1 capsule by mouth daily.    . calcium carbonate (TUMS EX) 750 MG chewable tablet Chew 2 tablets by mouth 2 (two) times daily.    . Cholecalciferol (VITAMIN D3 PO) Take 25 mg by mouth.    . hydrocortisone (ANUSOL-HC) 2.5 % rectal cream Place 1 application rectally at bedtime. Use for 7 days. 30 g 1  . Multiple  Vitamin (MULTIVITAMIN) tablet Take 1 tablet by mouth daily.     . Omega-3 Fatty Acids (FISH OIL) 1200 MG CAPS Take 1 capsule by mouth daily.    . pantoprazole (PROTONIX) 20 MG tablet TAKE 1 TABLET BY MOUTH DAILY BEFORE BREAKFAST 90 tablet 0   No current facility-administered medications on file prior to visit.    Review of Systems  Constitutional: Positive for chills and malaise/fatigue. Negative for fever.  HENT: Negative for congestion, ear pain, sinus pain and sore throat.   Eyes: Negative for blurred vision, discharge and redness.  Respiratory: Positive for cough. Negative for sputum production, shortness of breath, wheezing and stridor.   Cardiovascular: Negative for chest pain, palpitations and leg swelling.  Gastrointestinal: Negative for abdominal pain, diarrhea, nausea and vomiting.  Musculoskeletal: Negative for myalgias.  Skin: Negative for rash.  Neurological: Positive for headaches. Negative for dizziness.    Observations/Objective: Pt sounds fatigued Voice is not hoarse Dry cough once, does not sound sob or wheezy (completes full sentences)  Nl cognition- good historian Mood is normal   Assessment and Plan: Problem List Items Addressed This Visit      Other   COVID-19    Symptoms started about 9 days ago- ? How she got it  Test positive from Friday  Isolating (husb considering testing)  Symptoms are relatively mild/malaise and chills and slt cough Recommended 2 tylenol (reg) up to every 4-6 h  If needed-can add ibuprofen (with food) Watch temp  If cough worsens- will alert [place]  Update if not starting to improve in a week or if worsening            Follow Up Instructions: Drink fluids and rest  2 regular strength tylenol are ok to take every 4-6 hours as needed for chills/pain and malaise Stay isolated until symptoms are much better I encourage your husband to get tested   Let anyone know who you had contact with shortly before getting sick   Update if  not starting to improve in a week or if worsening   If short or breath or wheezing please alert me    I discussed the assessment and treatment plan with the patient. The patient was provided an opportunity to ask questions and all were answered. The patient agreed with the plan and demonstrated an understanding of the instructions.   The patient was advised to call back or seek an in-person evaluation if the symptoms worsen or if the condition fails to improve as anticipated.  I provided 13 minutes of non-face-to-face time during this encounter.   Loura Pardon, MD

## 2020-05-07 ENCOUNTER — Telehealth: Payer: Self-pay | Admitting: Family Medicine

## 2020-05-07 DIAGNOSIS — R7989 Other specified abnormal findings of blood chemistry: Secondary | ICD-10-CM

## 2020-05-07 DIAGNOSIS — E7800 Pure hypercholesterolemia, unspecified: Secondary | ICD-10-CM

## 2020-05-07 DIAGNOSIS — Z0000 Encounter for general adult medical examination without abnormal findings: Secondary | ICD-10-CM

## 2020-05-07 NOTE — Telephone Encounter (Signed)
-----   Message from Ellamae Sia sent at 04/23/2020  8:49 AM EDT ----- Regarding: Lab orders for Friday, 6.4.21 Patient is scheduled for CPX labs, please order future labs, Thanks , Karna Christmas

## 2020-05-08 ENCOUNTER — Other Ambulatory Visit (INDEPENDENT_AMBULATORY_CARE_PROVIDER_SITE_OTHER): Payer: BC Managed Care – PPO

## 2020-05-08 DIAGNOSIS — Z0000 Encounter for general adult medical examination without abnormal findings: Secondary | ICD-10-CM

## 2020-05-08 DIAGNOSIS — R7989 Other specified abnormal findings of blood chemistry: Secondary | ICD-10-CM

## 2020-05-08 DIAGNOSIS — E7800 Pure hypercholesterolemia, unspecified: Secondary | ICD-10-CM

## 2020-05-08 LAB — COMPREHENSIVE METABOLIC PANEL
ALT: 21 U/L (ref 0–35)
AST: 20 U/L (ref 0–37)
Albumin: 3.9 g/dL (ref 3.5–5.2)
Alkaline Phosphatase: 91 U/L (ref 39–117)
BUN: 11 mg/dL (ref 6–23)
CO2: 27 mEq/L (ref 19–32)
Calcium: 9.2 mg/dL (ref 8.4–10.5)
Chloride: 103 mEq/L (ref 96–112)
Creatinine, Ser: 0.72 mg/dL (ref 0.40–1.20)
GFR: 82.24 mL/min (ref >60.00)
Glucose, Bld: 93 mg/dL (ref 70–99)
Potassium: 4.1 mEq/L (ref 3.5–5.1)
Sodium: 139 mEq/L (ref 135–145)
Total Bilirubin: 0.4 mg/dL (ref 0.2–1.2)
Total Protein: 6.5 g/dL (ref 6.0–8.3)

## 2020-05-08 LAB — CBC WITH DIFFERENTIAL/PLATELET
Basophils Absolute: 0.1 10*3/uL (ref 0.0–0.1)
Basophils Relative: 0.8 % (ref 0.0–3.0)
Eosinophils Absolute: 0.3 10*3/uL (ref 0.0–0.7)
Eosinophils Relative: 3 % (ref 0.0–5.0)
HCT: 36.2 % (ref 36.0–46.0)
Hemoglobin: 12 g/dL (ref 12.0–15.0)
Lymphocytes Relative: 17.6 % (ref 12.0–46.0)
Lymphs Abs: 1.6 10*3/uL (ref 0.7–4.0)
MCHC: 33.2 g/dL (ref 30.0–36.0)
MCV: 84.3 fl (ref 78.0–100.0)
Monocytes Absolute: 0.8 10*3/uL (ref 0.1–1.0)
Monocytes Relative: 9.1 % (ref 3.0–12.0)
Neutro Abs: 6.4 10*3/uL (ref 1.4–7.7)
Neutrophils Relative %: 69.5 % (ref 43.0–77.0)
Platelets: 317 10*3/uL (ref 150.0–400.0)
RBC: 4.3 Mil/uL (ref 3.87–5.11)
RDW: 15.8 % — ABNORMAL HIGH (ref 11.5–15.5)
WBC: 9.2 10*3/uL (ref 4.0–10.5)

## 2020-05-08 LAB — LIPID PANEL
Cholesterol: 164 mg/dL (ref 0–200)
HDL: 31.6 mg/dL — ABNORMAL LOW (ref >39.00)
LDL Cholesterol: 99 mg/dL (ref 0–99)
NonHDL: 132.24
Total CHOL/HDL Ratio: 5
Triglycerides: 166 mg/dL — ABNORMAL HIGH (ref 0.0–149.0)
VLDL: 33.2 mg/dL (ref 0.0–40.0)

## 2020-05-08 LAB — T4, FREE: Free T4: 0.81 ng/dL (ref 0.60–1.60)

## 2020-05-08 LAB — TSH: TSH: 4.79 u[IU]/mL — ABNORMAL HIGH (ref 0.35–4.50)

## 2020-05-12 ENCOUNTER — Other Ambulatory Visit: Payer: Self-pay | Admitting: Internal Medicine

## 2020-05-18 ENCOUNTER — Other Ambulatory Visit: Payer: Self-pay

## 2020-05-18 ENCOUNTER — Encounter: Payer: Self-pay | Admitting: Family Medicine

## 2020-05-18 ENCOUNTER — Ambulatory Visit (INDEPENDENT_AMBULATORY_CARE_PROVIDER_SITE_OTHER): Payer: BC Managed Care – PPO | Admitting: Family Medicine

## 2020-05-18 VITALS — BP 128/78 | HR 89 | Temp 97.5°F | Ht 64.0 in | Wt 195.1 lb

## 2020-05-18 DIAGNOSIS — E7800 Pure hypercholesterolemia, unspecified: Secondary | ICD-10-CM

## 2020-05-18 DIAGNOSIS — E6609 Other obesity due to excess calories: Secondary | ICD-10-CM

## 2020-05-18 DIAGNOSIS — R7989 Other specified abnormal findings of blood chemistry: Secondary | ICD-10-CM | POA: Diagnosis not present

## 2020-05-18 DIAGNOSIS — Z8542 Personal history of malignant neoplasm of other parts of uterus: Secondary | ICD-10-CM

## 2020-05-18 DIAGNOSIS — Z6833 Body mass index (BMI) 33.0-33.9, adult: Secondary | ICD-10-CM

## 2020-05-18 DIAGNOSIS — Z0000 Encounter for general adult medical examination without abnormal findings: Secondary | ICD-10-CM | POA: Diagnosis not present

## 2020-05-18 MED ORDER — ATORVASTATIN CALCIUM 10 MG PO TABS: 10.0000 mg | ORAL_TABLET | Freq: Every evening | ORAL | 3 refills | Status: DC

## 2020-05-18 NOTE — Assessment & Plan Note (Signed)
No re occurrence  No longer sees onc  Sees gyn yearly - Dr Delorse Lek for last pap

## 2020-05-18 NOTE — Progress Notes (Signed)
Subjective:    Patient ID: Jane Ruiz, female    DOB: 10/03/1961, 59 y.o.   MRN: [id number]  This visit occurred during the SARS-CoV-2 public health emergency.  Safety protocols were in place, including screening questions prior to the visit, additional usage of staff PPE, and extensive cleaning of exam room while observing appropriate contact time as indicated for disinfecting solutions.    HPI Here for health maintenance exam and to review chronic medical problems    Wt Readings from Last 3 Encounters:  05/18/20 195 lb 2 oz (88.5 kg)  05/16/19 195 lb 6 oz (88.6 kg)  07/23/18 196 lb 9.6 oz (89.2 kg)  she is back to eating well / appetite is normal  Still to tired to exercise (had covid in may)  33.49 kg/m    covid status -had covid in May  (started April 30) - will get immunized later  Zoster status -had the shingrix vaccines   Flu shot 9/20 Tdap 4/19   Mammogram 9/20  Self breast exam - no lumps   Colonoscopy 7/19 with 3 y recall   Pap 8/19 neg with gyn  She has h/o endometrial cancer -released from cancer center in 2020  Last visit to gyn Dr Valentino Saxon 8/20 (had a pap)  No gyn problems   BP Readings from Last 3 Encounters:  05/18/20 128/78  05/16/19 124/82  07/23/18 139/79   Pulse Readings from Last 3 Encounters:  05/18/20 89  05/16/19 91  07/23/18 84    H/o elevated tsh Lab Results  Component Value Date   TSH 4.79 (H) 05/08/2020   Nl FT4 at 0.81 Feels tired - but getting over covid  No change in hair or skin   (she takes collagen and it also helps joints)  She stopped biotin    Hyperlipidemia Lab Results  Component Value Date   CHOL 164 05/08/2020   CHOL 145 05/07/2019   CHOL 161 03/14/2018   Lab Results  Component Value Date   HDL 31.60 (L) 05/08/2020   HDL 32.10 (L) 05/07/2019   HDL 36.50 (L) 03/14/2018   Lab Results  Component Value Date   LDLCALC 99 05/08/2020   LDLCALC 88 05/07/2019   LDLCALC 91 03/14/2018   Lab Results    Component Value Date   TRIG 166.0 (H) 05/08/2020   TRIG 125.0 05/07/2019   TRIG 171.0 (H) 03/14/2018   Lab Results  Component Value Date   CHOLHDL 5 05/08/2020   CHOLHDL 5 05/07/2019   CHOLHDL 4 03/14/2018   Lab Results  Component Value Date   LDLDIRECT 172.4 12/24/2010   LDLDIRECT 174.9 09/17/2010   LDLDIRECT 164.2 07/06/2010   takes atorvastatin 10 mg   Plans to use treadmill for exercise /planning retirement   Lab Results  Component Value Date   WBC 9.2 05/08/2020   HGB 12.0 05/08/2020   HCT 36.2 05/08/2020   MCV 84.3 05/08/2020   PLT 317.0 05/08/2020   Lab Results  Component Value Date   NA 139 05/08/2020   K 4.1 05/08/2020   CO2 27 05/08/2020   GLUCOSE 93 05/08/2020   BUN 11 05/08/2020   CREATININE 0.72 05/08/2020   CALCIUM 9.2 05/08/2020   GFRNONAA >90 02/13/2013   GFRAA >90 02/13/2013   Lab Results  Component Value Date   ALT 21 05/08/2020   AST 20 05/08/2020   ALKPHOS 91 05/08/2020   BILITOT 0.4 05/08/2020     Patient Active Problem List   Diagnosis Date Noted  .  Bleeding internal hemorrhoids 06/13/2018  . Hx of adenomatous polyp of colon 06/13/2018  . Elevated TSH 01/04/2016  . History of endometrial cancer 02/12/2013  . Other screening mammogram 11/04/2011  . Obesity 11/04/2011  . Routine general medical examination at a health care facility 10/20/2011  . PLANTAR FASCIITIS 12/31/2010  . Hyperlipidemia 09/24/2010  . MENOPAUSAL SYNDROME 07/06/2010   Past Medical History:  Diagnosis Date  . endometrial ca 02/12/13   endometrial, positive nodes  . External hemorrhoids   . History of radiation therapy 05/27/2013-07/01/2013   pelvis 45 gray in 25 fractions, 63 gray with high dose brachytherapy  . Hx of adenomatous polyp of colon 06/13/2018  . Hyperlipidemia    borderline high cholesterol  . Hyperplastic colon polyp   . Internal hemorrhoids   . Migraine   . Skin lesion    non maligant skin lesions on head   Past Surgical History:   Procedure Laterality Date  . ABDOMINAL HYSTERECTOMY  02/12/13  . COLONOSCOPY W/ BIOPSIES  06/15/2009   Dr Watt Climes - distal hyperplastic polyps  . RHINOPLASTY     breathing problems- Dr Ernesto Rutherford  . ROBOTIC ASSISTED TOTAL HYSTERECTOMY WITH BILATERAL SALPINGO OOPHERECTOMY N/A 02/12/2013   Procedure: ROBOTIC ASSISTED TOTAL HYSTERECTOMY WITH BILATERAL SALPINGO OOPHORECTOMY,  LYMPH NODES;  Surgeon: Imagene Gurney A. Alycia Rossetti, MD;  Location: [facility];  Service: Gynecology;  Laterality: N/A;   Social History   Tobacco Use  . Smoking status: Never Smoker  . Smokeless tobacco: Never Used  Vaping Use  . Vaping Use: Never used  Substance Use Topics  . Alcohol use: No    Alcohol/week: 0.0 standard drinks  . Drug use: No   Family History  Problem Relation Age of Onset  . Diabetes Mother   . Kidney disease Mother   . Benign prostatic hyperplasia Father   . Diabetes Maternal Grandmother   . Colon cancer Neg Hx    No Known Allergies Current Outpatient Medications on File Prior to Visit  Medication Sig Dispense Refill  . calcium carbonate (TUMS EX) 750 MG chewable tablet Chew 2 tablets by mouth 2 (two) times daily.    . Cholecalciferol (VITAMIN D3 PO) Take 25 mg by mouth.    . COLLAGEN PO Take 1 tablet by mouth daily.    Marland Kitchen MAGNESIUM PO Take 0.5 tablets by mouth daily.    . Multiple Vitamin (MULTIVITAMIN) tablet Take 1 tablet by mouth daily.     . Omega-3 Fatty Acids (FISH OIL) 1200 MG CAPS Take 1 capsule by mouth daily.    . pantoprazole (PROTONIX) 20 MG tablet TAKE 1 TABLET BY MOUTH DAILY BEFORE BREAKFAST 90 tablet 0   No current facility-administered medications on file prior to visit.    Review of Systems  Constitutional: Positive for fatigue. Negative for activity change, appetite change, fever and unexpected weight change.       Fatigue post covid  HENT: Negative for congestion, ear pain, rhinorrhea, sinus pressure and sore throat.   Eyes: Negative for pain, redness and visual disturbance.   Respiratory: Negative for cough, shortness of breath and wheezing.   Cardiovascular: Negative for chest pain and palpitations.  Gastrointestinal: Negative for abdominal pain, blood in stool, constipation and diarrhea.  Endocrine: Negative for polydipsia and polyuria.  Genitourinary: Negative for dysuria, frequency and urgency.  Musculoskeletal: Negative for arthralgias, back pain, gait problem and myalgias.  Skin: Negative for pallor and rash.  Allergic/Immunologic: Negative for environmental allergies.  Neurological: Negative for dizziness, syncope and headaches.  Hematological: Negative  for adenopathy. Does not bruise/bleed easily.  Psychiatric/Behavioral: Negative for decreased concentration and dysphoric mood. The patient is not nervous/anxious.        Objective:   Physical Exam Constitutional:      General: She is not in acute distress.    Appearance: Normal appearance. She is well-developed. She is obese. She is not ill-appearing or diaphoretic.  HENT:     Head: Normocephalic and atraumatic.     Right Ear: Tympanic membrane, ear canal and external ear normal.     Left Ear: Tympanic membrane, ear canal and external ear normal.     Nose: Nose normal. No congestion.     Mouth/Throat:     Mouth: Mucous membranes are moist.     Pharynx: Oropharynx is clear. No posterior oropharyngeal erythema.  Eyes:     General: No scleral icterus.    Extraocular Movements: Extraocular movements intact.     Conjunctiva/sclera: Conjunctivae normal.     Pupils: Pupils are equal, round, and reactive to light.  Neck:     Thyroid: No thyromegaly.     Vascular: No carotid bruit or JVD.  Cardiovascular:     Rate and Rhythm: Normal rate and regular rhythm.     Pulses: Normal pulses.     Heart sounds: Normal heart sounds. No gallop.   Pulmonary:     Effort: Pulmonary effort is normal. No respiratory distress.     Breath sounds: Normal breath sounds. No wheezing.     Comments: Good air  exch Chest:     Chest wall: No tenderness.  Abdominal:     General: Bowel sounds are normal. There is no distension or abdominal bruit.     Palpations: Abdomen is soft. There is no mass.     Tenderness: There is no abdominal tenderness.     Hernia: No hernia is present.  Genitourinary:    Comments: Breast and pelvic exam done by gyn provider   Musculoskeletal:        General: No tenderness. Normal range of motion.     Cervical back: Normal range of motion and neck supple. No rigidity. No muscular tenderness.     Right lower leg: No edema.     Left lower leg: No edema.  Lymphadenopathy:     Cervical: No cervical adenopathy.  Skin:    General: Skin is warm and dry.     Coloration: Skin is not pale.     Findings: No erythema or rash.     Comments: Fair  Some SKs on trunk  Neurological:     Mental Status: She is alert. Mental status is at baseline.     Cranial Nerves: No cranial nerve deficit.     Motor: No abnormal muscle tone.     Coordination: Coordination normal.     Gait: Gait normal.     Deep Tendon Reflexes: Reflexes are normal and symmetric. Reflexes normal.  Psychiatric:        Mood and Affect: Mood normal.        Cognition and Memory: Cognition and memory normal.     Comments: pleasant           Assessment & Plan:   Problem List Items Addressed This Visit      Other   Hyperlipidemia    Fair control with atorvastatin and diet  Needs more exercise to raise HDL Disc goals for lipids and reasons to control them Rev last labs with pt Rev low sat fat diet in detail  Relevant Medications   atorvastatin (LIPITOR) 10 MG tablet   Routine general medical examination at a health care facility - Primary    Reviewed health habits including diet and exercise and skin cancer prevention Reviewed appropriate screening tests for age  Also reviewed health mt list, fam hx and immunization status , as well as social and family history   See HPI Had covid in late  April - gradually getting energy back / will plan on covid vaccine sometime after end of June Enc her to gradually return to exercise on treadmill Healthy diet enc for wt loss  UTD gyn visit  Colonoscopy due 7/22 Doing well post endometrial cancer  Has had zoster vaccines        Obesity    Discussed how this problem influences overall health and the risks it imposes  Reviewed plan for weight loss with lower calorie diet (via better food choices and also portion control or program like weight watchers) and exercise building up to or more than 30 minutes 5 days per week including some aerobic activity   See AVS-urged to get her carbs from produce      History of endometrial cancer    No re occurrence  No longer sees onc  Sees gyn yearly - Dr Delorse Lek for last pap      Elevated TSH    Subclinical hypothyroidism -very slt elevated TSH with nl FT4 Will continue to watch  Disc symptoms to watch for

## 2020-05-18 NOTE — Assessment & Plan Note (Signed)
Discussed how this problem influences overall health and the risks it imposes  Reviewed plan for weight loss with lower calorie diet (via better food choices and also portion control or program like weight watchers) and exercise building up to or more than 30 minutes 5 days per week including some aerobic activity   See AVS-urged to get her carbs from produce

## 2020-05-18 NOTE — Assessment & Plan Note (Signed)
Fair control with atorvastatin and diet  Needs more exercise to raise HDL Disc goals for lipids and reasons to control them Rev last labs with pt Rev low sat fat diet in detail

## 2020-05-18 NOTE — Assessment & Plan Note (Signed)
Reviewed health habits including diet and exercise and skin cancer prevention Reviewed appropriate screening tests for age  Also reviewed health mt list, fam hx and immunization status , as well as social and family history   See HPI Had covid in late April - gradually getting energy back / will plan on covid vaccine sometime after end of June Enc her to gradually return to exercise on treadmill Healthy diet enc for wt loss  UTD gyn visit  Colonoscopy due 7/22 Doing well post endometrial cancer  Has had zoster vaccines

## 2020-05-18 NOTE — Patient Instructions (Addendum)
You can consider getting a covid vaccine -end of June or later   Your HDL is low  Exercise helps -gradually get back on track  Also taking fish oil (omega 3)   In general Try to get most of your carbohydrates from produce (with the exception of white potatoes)  Eat less bread/pasta/rice/snack foods/cereals/sweets and other items from the middle of the grocery store (processed carbs)   Take care of yourself   Stop at check out to send for last pap report

## 2020-05-18 NOTE — Assessment & Plan Note (Signed)
Subclinical hypothyroidism -very slt elevated TSH with nl FT4 Will continue to watch  Disc symptoms to watch for

## 2020-07-02 ENCOUNTER — Telehealth: Payer: Self-pay | Admitting: Internal Medicine

## 2020-07-02 MED ORDER — PANTOPRAZOLE SODIUM 20 MG PO TBEC: DELAYED_RELEASE_TABLET | ORAL | 0 refills | Status: DC

## 2020-07-02 NOTE — Telephone Encounter (Signed)
I called [facility] and confirmed the pharmacy she wanted her pantoprazole sent to.

## 2020-07-06 ENCOUNTER — Encounter: Payer: Self-pay | Admitting: Internal Medicine

## 2020-07-06 ENCOUNTER — Ambulatory Visit (INDEPENDENT_AMBULATORY_CARE_PROVIDER_SITE_OTHER): Payer: BC Managed Care – PPO | Admitting: Internal Medicine

## 2020-07-06 VITALS — BP 120/82 | HR 97 | Ht 64.0 in | Wt 198.0 lb

## 2020-07-06 DIAGNOSIS — K222 Esophageal obstruction: Secondary | ICD-10-CM | POA: Diagnosis not present

## 2020-07-06 DIAGNOSIS — K219 Gastro-esophageal reflux disease without esophagitis: Secondary | ICD-10-CM

## 2020-07-06 MED ORDER — PANTOPRAZOLE SODIUM 20 MG PO TBEC: DELAYED_RELEASE_TABLET | ORAL | 3 refills | Status: DC

## 2020-07-06 NOTE — Progress Notes (Signed)
   Jane Ruiz 59 y.o. Apr 04, 1961 [id number]  Assessment & Plan:   Encounter Diagnosis  Name Primary?  . GERD with stricture Yes    Doing well on current regimen of pantoprazole 20 mg daily.  She will continue this she could get this refilled through primary care she will request that it would save her a visit to a specialist.  That is, unless she is not doing well then I would see her back.  Repeat colonoscopy planned for next year due to history of adenomatous polyp.   Subjective:   Chief Complaint: Follow-up of GERD with esophageal stricture  HPI Jane Ruiz is here today reporting she is doing well, she takes pantoprazole 20 mg daily, history of esophageal stricture status post dilation to 18 mm, she also has a 7 cm hiatal hernia.  And a 10 mm adenoma in 2019 the colonoscopy due for repeat in 2022. No Known Allergies Current Meds  Medication Sig  . atorvastatin (LIPITOR) 10 MG tablet Take 1 tablet (10 mg total) by mouth every evening.  . calcium carbonate (TUMS EX) 750 MG chewable tablet Chew 2 tablets by mouth 2 (two) times daily.  . Cholecalciferol (VITAMIN D3 PO) Take 25 mg by mouth.  . COLLAGEN PO Take 1 tablet by mouth daily.  . Multiple Vitamin (MULTIVITAMIN) tablet Take 1 tablet by mouth daily.   . Omega-3 Fatty Acids (FISH OIL) 1200 MG CAPS Take 1 capsule by mouth daily.  . pantoprazole (PROTONIX) 20 MG tablet TAKE 1 TABLET BY MOUTH DAILY BEFORE BREAKFAST   Past Medical History:  Diagnosis Date  . endometrial ca 02/12/13   endometrial, positive nodes  . External hemorrhoids   . History of radiation therapy 05/27/2013-07/01/2013   pelvis 45 gray in 25 fractions, 63 gray with high dose brachytherapy  . Hx of adenomatous polyp of colon 06/13/2018  . Hyperlipidemia    borderline high cholesterol  . Hyperplastic colon polyp   . Internal hemorrhoids   . Migraine   . Skin lesion    non maligant skin lesions on head   Past Surgical History:  Procedure Laterality  Date  . ABDOMINAL HYSTERECTOMY  02/12/13  . COLONOSCOPY W/ BIOPSIES  06/15/2009   Dr Watt Climes - distal hyperplastic polyps  . RHINOPLASTY     breathing problems- Dr Ernesto Rutherford  . ROBOTIC ASSISTED TOTAL HYSTERECTOMY WITH BILATERAL SALPINGO OOPHERECTOMY N/A 02/12/2013   Procedure: ROBOTIC ASSISTED TOTAL HYSTERECTOMY WITH BILATERAL SALPINGO OOPHORECTOMY,  LYMPH NODES;  Surgeon: Imagene Gurney A. Alycia Rossetti, MD;  Location: [facility];  Service: Gynecology;  Laterality: N/A;   Social History   Social History Narrative   Married one daughter born 1982 2 grandchildren   Network engineer at [employer]   1 cup of caffeine daily   07/18/2017   family history includes Benign prostatic hyperplasia in her father; Diabetes in her maternal grandmother and mother; Kidney disease in her mother.   Review of Systems As above, she did experience hair loss recently, she had Covid in April and wonders if that was a trigger.  No medication changes.  Objective:   Physical Exam BP 120/82   Pulse 97   Ht 5\' 4"  (1.626 m)   Wt 198 lb (89.8 kg)   BMI 33.99 kg/m  No acute distress

## 2020-07-06 NOTE — Patient Instructions (Addendum)
Glad your swallowing is doing well and no significant heartburn.  As we discussed you can see if Dr. Glori Bickers will fill your pantoprazole Rx going forward.  I did refill it for another year and asked the pharmacy not to fill it until you request it.  I appreciate the opportunity to care for you. Gatha Mayer, MD, [facility]

## 2020-08-19 ENCOUNTER — Encounter: Payer: Self-pay | Admitting: Family Medicine

## 2020-08-19 DIAGNOSIS — Z1231 Encounter for screening mammogram for malignant neoplasm of breast: Secondary | ICD-10-CM | POA: Diagnosis not present

## 2020-11-19 ENCOUNTER — Ambulatory Visit (INDEPENDENT_AMBULATORY_CARE_PROVIDER_SITE_OTHER): Payer: 59 | Admitting: Obstetrics & Gynecology

## 2020-11-19 ENCOUNTER — Encounter: Payer: Self-pay | Admitting: Obstetrics & Gynecology

## 2020-11-19 ENCOUNTER — Other Ambulatory Visit: Payer: Self-pay

## 2020-11-19 VITALS — BP 128/84 | Ht 64.0 in | Wt 197.4 lb

## 2020-11-19 DIAGNOSIS — Z1272 Encounter for screening for malignant neoplasm of vagina: Secondary | ICD-10-CM

## 2020-11-19 DIAGNOSIS — Z780 Asymptomatic menopausal state: Secondary | ICD-10-CM | POA: Diagnosis not present

## 2020-11-19 DIAGNOSIS — Z13820 Encounter for screening for osteoporosis: Secondary | ICD-10-CM | POA: Diagnosis not present

## 2020-11-19 DIAGNOSIS — Z6833 Body mass index (BMI) 33.0-33.9, adult: Secondary | ICD-10-CM

## 2020-11-19 DIAGNOSIS — Z01419 Encounter for gynecological examination (general) (routine) without abnormal findings: Secondary | ICD-10-CM

## 2020-11-19 DIAGNOSIS — E6609 Other obesity due to excess calories: Secondary | ICD-10-CM

## 2020-11-19 DIAGNOSIS — Z8542 Personal history of malignant neoplasm of other parts of uterus: Secondary | ICD-10-CM

## 2020-11-19 NOTE — Progress Notes (Signed)
Jane Ruiz 03-Jun-1961 [id number]   History:    59 y.o. G1P1L1  Married.  Daughter [name]  RP:  New patient presenting for annual gyn exam   HPI: Postmenopause, well on no HRT.  S/P Total Hysterectomy/BSO/Staging for Endometrial Ca stage 3 in 2014.  Adjuvant ChemoRx and RadioRx received.  No pelvic pain.  Breasts normal.  Urine/BMs normal.  BMI 33.88.  Started daily treadmill.  Healthy nutrition.  Health Labs with Fam MD.  Colonoscopy 3 yrs ago, will schedule in 2022.    Past medical history,surgical history, family history and social history were all reviewed and documented in the EPIC chart.  Gynecologic History No LMP recorded. Patient has had a hysterectomy.  Obstetric History OB History  Gravida Para Term Preterm AB Living  1 1       1   SAB IAB Ectopic Multiple Live Births               # Outcome Date GA Lbr Len/2nd Weight Sex Delivery Anes PTL Lv  1 Para              ROS: A ROS was performed and pertinent positives and negatives are included in the history.  GENERAL: No fevers or chills. HEENT: No change in vision, no earache, sore throat or sinus congestion. NECK: No pain or stiffness. CARDIOVASCULAR: No chest pain or pressure. No palpitations. PULMONARY: No shortness of breath, cough or wheeze. GASTROINTESTINAL: No abdominal pain, nausea, vomiting or diarrhea, melena or bright red blood per rectum. GENITOURINARY: No urinary frequency, urgency, hesitancy or dysuria. MUSCULOSKELETAL: No joint or muscle pain, no back pain, no recent trauma. DERMATOLOGIC: No rash, no itching, no lesions. ENDOCRINE: No polyuria, polydipsia, no heat or cold intolerance. No recent change in weight. HEMATOLOGICAL: No anemia or easy bruising or bleeding. NEUROLOGIC: No headache, seizures, numbness, tingling or weakness. PSYCHIATRIC: No depression, no loss of interest in normal activity or change in sleep pattern.     Exam:   BP 128/84   Ht 5\' 4"  (1.626 m)   Wt 197 lb 6.4 oz (89.5 kg)    BMI 33.88 kg/m   Body mass index is 33.88 kg/m.  General appearance : Well developed well nourished female. No acute distress HEENT: Eyes: no retinal hemorrhage or exudates,  Neck supple, trachea midline, no carotid bruits, no thyroidmegaly Lungs: Clear to auscultation, no rhonchi or wheezes, or rib retractions  Heart: Regular rate and rhythm, no murmurs or gallops Breast:Examined in sitting and supine position were symmetrical in appearance, no palpable masses or tenderness,  no skin retraction, no nipple inversion, no nipple discharge, no skin discoloration, no axillary or supraclavicular lymphadenopathy Abdomen: no palpable masses or tenderness, no rebound or guarding Extremities: no edema or skin discoloration or tenderness  Pelvic: Vulva: Normal             Vagina: No gross lesions or discharge.  Pap reflex done.  Cervix/Uterus absent  Adnexa  Without masses or tenderness  Anus: Normal   Assessment/Plan:  59 y.o. female for annual exam   1. Encounter for Papanicolaou smear of vagina as part of routine gynecological examination Gynecologic exam status post TAH/BSO staging for endometrial cancer.  Pap reflex done on the vaginal vault.  Breast exam normal.  Screening mammogram September 2021 was negative.  Colonoscopy every 3 years, will schedule in 2022.  Health labs with family physician.  2. Postmenopause Well on no hormone replacement therapy.    3. Screening for osteoporosis Schedule  bone density here now.Vitamin D supplements, calcium intake of 1500 mg daily and regular weightbearing physical activity is recommended. - DG Bone Density; Future  4. History of endometrial cancer Status post TAH/BSO staging.  Pap reflex done today on the vaginal vault.  Pelvic exam revealed no mass or tenderness.  5. Class 1 obesity due to excess calories with serious comorbidity and body mass index (BMI) of 33.0 to 33.9 in adult Recommend a lower calorie/carb diet.  Aerobic activities 5  times a week and light weightlifting every 2 days.  Princess Bruins MD, 9:59 AM 11/19/2020

## 2020-11-20 LAB — PAP IG W/ RFLX HPV ASCU

## 2020-11-30 ENCOUNTER — Encounter: Payer: Self-pay | Admitting: Obstetrics & Gynecology

## 2021-01-19 ENCOUNTER — Telehealth: Payer: Self-pay | Admitting: Family Medicine

## 2021-01-19 MED ORDER — ATORVASTATIN CALCIUM 10 MG PO TABS: 10.0000 mg | ORAL_TABLET | Freq: Every evening | ORAL | 1 refills | Status: DC

## 2021-01-19 NOTE — Telephone Encounter (Signed)
Patient needs a refill on Lipitor  Please send to [facility] in [place] on [address]. EM

## 2021-01-19 NOTE — Telephone Encounter (Signed)
done

## 2021-05-04 ENCOUNTER — Other Ambulatory Visit: Payer: Self-pay | Admitting: Obstetrics & Gynecology

## 2021-05-04 ENCOUNTER — Other Ambulatory Visit: Payer: Self-pay

## 2021-05-04 ENCOUNTER — Ambulatory Visit (INDEPENDENT_AMBULATORY_CARE_PROVIDER_SITE_OTHER): Payer: 59

## 2021-05-04 DIAGNOSIS — Z13820 Encounter for screening for osteoporosis: Secondary | ICD-10-CM

## 2021-05-04 DIAGNOSIS — M85851 Other specified disorders of bone density and structure, right thigh: Secondary | ICD-10-CM | POA: Diagnosis not present

## 2021-05-04 DIAGNOSIS — Z780 Asymptomatic menopausal state: Secondary | ICD-10-CM

## 2021-05-04 LAB — HM DEXA SCAN

## 2021-05-11 ENCOUNTER — Telehealth: Payer: Self-pay | Admitting: Family Medicine

## 2021-05-11 DIAGNOSIS — Z0000 Encounter for general adult medical examination without abnormal findings: Secondary | ICD-10-CM

## 2021-05-11 DIAGNOSIS — E7800 Pure hypercholesterolemia, unspecified: Secondary | ICD-10-CM

## 2021-05-11 DIAGNOSIS — R7989 Other specified abnormal findings of blood chemistry: Secondary | ICD-10-CM

## 2021-05-11 NOTE — Telephone Encounter (Signed)
-----   Message from Cloyd Stagers, RT sent at 04/26/2021 12:23 PM EDT ----- Regarding: Lab Orders for Wednesday 6.8.2022 Please place lab orders for Wednesday 6.8.2022, office visit for physical on Wednesday 6.15.2022 Thank you, Dyke Maes RT(R)

## 2021-05-12 ENCOUNTER — Other Ambulatory Visit (INDEPENDENT_AMBULATORY_CARE_PROVIDER_SITE_OTHER): Payer: 59

## 2021-05-12 ENCOUNTER — Other Ambulatory Visit: Payer: Self-pay

## 2021-05-12 DIAGNOSIS — R7989 Other specified abnormal findings of blood chemistry: Secondary | ICD-10-CM

## 2021-05-12 DIAGNOSIS — Z0000 Encounter for general adult medical examination without abnormal findings: Secondary | ICD-10-CM | POA: Diagnosis not present

## 2021-05-12 DIAGNOSIS — E7800 Pure hypercholesterolemia, unspecified: Secondary | ICD-10-CM | POA: Diagnosis not present

## 2021-05-12 LAB — CBC WITH DIFFERENTIAL/PLATELET
Basophils Absolute: 0.1 10*3/uL (ref 0.0–0.1)
Basophils Relative: 0.7 % (ref 0.0–3.0)
Eosinophils Absolute: 0.2 10*3/uL (ref 0.0–0.7)
Eosinophils Relative: 2.3 % (ref 0.0–5.0)
HCT: 39.8 % (ref 36.0–46.0)
Hemoglobin: 13.1 g/dL (ref 12.0–15.0)
Lymphocytes Relative: 16.5 % (ref 12.0–46.0)
Lymphs Abs: 1.7 10*3/uL (ref 0.7–4.0)
MCHC: 32.9 g/dL (ref 30.0–36.0)
MCV: 81.8 fl (ref 78.0–100.0)
Monocytes Absolute: 0.8 10*3/uL (ref 0.1–1.0)
Monocytes Relative: 7.7 % (ref 3.0–12.0)
Neutro Abs: 7.6 10*3/uL (ref 1.4–7.7)
Neutrophils Relative %: 72.8 % (ref 43.0–77.0)
Platelets: 322 10*3/uL (ref 150.0–400.0)
RBC: 4.87 Mil/uL (ref 3.87–5.11)
RDW: 15 % (ref 11.5–15.5)
WBC: 10.4 10*3/uL (ref 4.0–10.5)

## 2021-05-12 LAB — COMPREHENSIVE METABOLIC PANEL
ALT: 11 U/L (ref 0–35)
AST: 13 U/L (ref 0–37)
Albumin: 4.2 g/dL (ref 3.5–5.2)
Alkaline Phosphatase: 90 U/L (ref 39–117)
BUN: 15 mg/dL (ref 6–23)
CO2: 27 mEq/L (ref 19–32)
Calcium: 9.6 mg/dL (ref 8.4–10.5)
Chloride: 106 mEq/L (ref 96–112)
Creatinine, Ser: 0.82 mg/dL (ref 0.40–1.20)
GFR: 76.67 mL/min (ref >60.00)
Glucose, Bld: 91 mg/dL (ref 70–99)
Potassium: 4.2 mEq/L (ref 3.5–5.1)
Sodium: 142 mEq/L (ref 135–145)
Total Bilirubin: 0.5 mg/dL (ref 0.2–1.2)
Total Protein: 6.9 g/dL (ref 6.0–8.3)

## 2021-05-12 LAB — LIPID PANEL
Cholesterol: 170 mg/dL (ref 0–200)
HDL: 37.8 mg/dL — ABNORMAL LOW (ref >39.00)
LDL Cholesterol: 99 mg/dL (ref 0–99)
NonHDL: 131.85
Total CHOL/HDL Ratio: 4
Triglycerides: 162 mg/dL — ABNORMAL HIGH (ref 0.0–149.0)
VLDL: 32.4 mg/dL (ref 0.0–40.0)

## 2021-05-12 LAB — T4, FREE: Free T4: 0.78 ng/dL (ref 0.60–1.60)

## 2021-05-12 LAB — TSH: TSH: 6.69 u[IU]/mL — ABNORMAL HIGH (ref 0.35–4.50)

## 2021-05-18 ENCOUNTER — Telehealth: Payer: Self-pay | Admitting: Internal Medicine

## 2021-05-18 NOTE — Telephone Encounter (Signed)
Pt states her hiatal hernia is bothering her, reports it is hard for her to swallow. She wants to know if she can take another 20mg  of protonix to see if that will help. Please advise.,

## 2021-05-18 NOTE — Telephone Encounter (Signed)
She can take another 20 mg  If symptoms occurring later in day then take it before supper  If occurring earlier in day take 2 together before breakfast  If she needs a refill let us know - I do see she has appt JLL in about 1 month so we can also change Rx then

## 2021-05-18 NOTE — Telephone Encounter (Signed)
Spoke with pt and she is aware and will try taking 2 in the am prior to breakfast. She knows to keep her OV as scheduled.

## 2021-05-18 NOTE — Telephone Encounter (Signed)
Patient called said her hiatal hernia is getting worst and she is requesting to speak with a nurse regarding the Protonix medication she is wanting to take a higher dosage to see if that would help her symptoms.

## 2021-05-19 ENCOUNTER — Other Ambulatory Visit: Payer: Self-pay

## 2021-05-19 ENCOUNTER — Ambulatory Visit (INDEPENDENT_AMBULATORY_CARE_PROVIDER_SITE_OTHER): Payer: 59 | Admitting: Family Medicine

## 2021-05-19 ENCOUNTER — Encounter: Payer: Self-pay | Admitting: Family Medicine

## 2021-05-19 VITALS — BP 135/85 | HR 102 | Temp 96.9°F | Ht 64.25 in | Wt 189.4 lb

## 2021-05-19 DIAGNOSIS — E039 Hypothyroidism, unspecified: Secondary | ICD-10-CM

## 2021-05-19 DIAGNOSIS — Z0000 Encounter for general adult medical examination without abnormal findings: Secondary | ICD-10-CM

## 2021-05-19 DIAGNOSIS — Z86010 Personal history of colonic polyps: Secondary | ICD-10-CM

## 2021-05-19 DIAGNOSIS — E7800 Pure hypercholesterolemia, unspecified: Secondary | ICD-10-CM

## 2021-05-19 DIAGNOSIS — E6609 Other obesity due to excess calories: Secondary | ICD-10-CM

## 2021-05-19 DIAGNOSIS — K219 Gastro-esophageal reflux disease without esophagitis: Secondary | ICD-10-CM | POA: Insufficient documentation

## 2021-05-19 DIAGNOSIS — Z6832 Body mass index (BMI) 32.0-32.9, adult: Secondary | ICD-10-CM

## 2021-05-19 MED ORDER — LEVOTHYROXINE SODIUM 25 MCG PO TABS: 25.0000 ug | ORAL_TABLET | Freq: Every day | ORAL | 3 refills | Status: DC

## 2021-05-19 MED ORDER — ATORVASTATIN CALCIUM 10 MG PO TABS: 10.0000 mg | ORAL_TABLET | Freq: Every evening | ORAL | 3 refills | Status: DC

## 2021-05-19 NOTE — Assessment & Plan Note (Signed)
More reflux symptoms and dysphagia lately  Pt has f/u with GI soon  May have to inc her protonix to 40 mg  May need another esoph dilatation (of note also due for colonoscopy)  She will discuss those issues then  Also adv to watch diet best she can

## 2021-05-19 NOTE — Assessment & Plan Note (Signed)
Disc goals for lipids and reasons to control them Rev last labs with pt Rev low sat fat diet in detail Fair control with atorvastatin 10 mg daily  Enc to eat lean protein

## 2021-05-19 NOTE — Progress Notes (Signed)
Subjective:    Patient ID: Jane Ruiz, female    DOB: [birth year], 60 y.o.   MRN: [id number]  This visit occurred during the SARS-CoV-2 public health emergency.  Safety protocols were in place, including screening questions prior to the visit, additional usage of staff PPE, and extensive cleaning of exam room while observing appropriate contact time as indicated for disinfecting solutions.   HPI Here for health maintenance exam and to review chronic medical problems    Wt Readings from Last 3 Encounters:  05/19/21 189 lb 6 oz (85.9 kg)  11/19/20 197 lb 6.4 oz (89.5 kg)  07/06/20 198 lb (89.8 kg)   32.25 kg/m  HH is bothering her more- has f/u planned with GI  Feels like something is stuck in her throat Feels reflux more lately  Protonix 20 mg   Retired - less stress  Trying to eat healthier and walks 1.25 mi per day  Lost some weight  Eating more fiber  No sweet drinks   Declines covid vaccines Had covid last year - treated symptoms Noted she lost some hair after that (now has slowed down)- tried collagen   Flu shot -did get in the fall  Tdap 4/19 Had shingrix vaccines  Colonoscopy 7/19 with 3 y recall -due next mo  Will make the appt when she is at the GI office   Pap 8/19 nl at gyn Had a visit in December -now seeing Dr Scherrie Bateman   Late in May she had a bone density test- still pending results  S/p hysterectomy in 2014 for endomet cancer   Mammogram 9/21 Self breast exam - no lumps   BP Readings from Last 3 Encounters:  05/19/21 135/85  11/19/20 128/84  07/06/20 120/82    Pulse Readings from Last 3 Encounters:  05/19/21 (!) 102  07/06/20 97  05/18/20 89   Some anxiety today   H/o elevated tsh Lab Results  Component Value Date   TSH 6.69 (H) 05/12/2021   FT4 is nl at 0.78  She has noticed some memory issues  Short term  Some sluggishness also   Mood is ok    Hyperlipidemia Lab Results  Component Value Date   CHOL 170 05/12/2021   CHOL  164 05/08/2020   CHOL 145 05/07/2019   Lab Results  Component Value Date   HDL 37.80 (L) 05/12/2021   HDL 31.60 (L) 05/08/2020   HDL 32.10 (L) 05/07/2019   Lab Results  Component Value Date   LDLCALC 99 05/12/2021   LDLCALC 99 05/08/2020   LDLCALC 88 05/07/2019   Lab Results  Component Value Date   TRIG 162.0 (H) 05/12/2021   TRIG 166.0 (H) 05/08/2020   TRIG 125.0 05/07/2019   Lab Results  Component Value Date   CHOLHDL 4 05/12/2021   CHOLHDL 5 05/08/2020   CHOLHDL 5 05/07/2019   Lab Results  Component Value Date   LDLDIRECT 172.4 12/24/2010   LDLDIRECT 174.9 09/17/2010   LDLDIRECT 164.2 07/06/2010   Atorvastatin 10 mg daily  Stable   Other labs Results for orders placed or performed in visit on 05/12/21  T4, free  Result Value Ref Range   Free T4 0.78 0.60 - 1.60 ng/dL  TSH  Result Value Ref Range   TSH 6.69 (H) 0.35 - 4.50 uIU/mL  Lipid panel  Result Value Ref Range   Cholesterol 170 0 - 200 mg/dL   Triglycerides 162.0 (H) 0.0 - 149.0 mg/dL   HDL 37.80 (L) >39.00 mg/dL  VLDL 32.4 0.0 - 40.0 mg/dL   LDL Cholesterol 99 0 - 99 mg/dL   Total CHOL/HDL Ratio 4    NonHDL 131.85   Comprehensive metabolic panel  Result Value Ref Range   Sodium 142 135 - 145 mEq/L   Potassium 4.2 3.5 - 5.1 mEq/L   Chloride 106 96 - 112 mEq/L   CO2 27 19 - 32 mEq/L   Glucose, Bld 91 70 - 99 mg/dL   BUN 15 6 - 23 mg/dL   Creatinine, Ser 0.82 0.40 - 1.20 mg/dL   Total Bilirubin 0.5 0.2 - 1.2 mg/dL   Alkaline Phosphatase 90 39 - 117 U/L   AST 13 0 - 37 U/L   ALT 11 0 - 35 U/L   Total Protein 6.9 6.0 - 8.3 g/dL   Albumin 4.2 3.5 - 5.2 g/dL   GFR 76.67 >60.00 mL/min   Calcium 9.6 8.4 - 10.5 mg/dL  CBC with Differential/Platelet  Result Value Ref Range   WBC 10.4 4.0 - 10.5 K/uL   RBC 4.87 3.87 - 5.11 Mil/uL   Hemoglobin 13.1 12.0 - 15.0 g/dL   HCT 39.8 36.0 - 46.0 %   MCV 81.8 78.0 - 100.0 fl   MCHC 32.9 30.0 - 36.0 g/dL   RDW 15.0 11.5 - 15.5 %   Platelets 322.0  150.0 - 400.0 K/uL   Neutrophils Relative % 72.8 43.0 - 77.0 %   Lymphocytes Relative 16.5 12.0 - 46.0 %   Monocytes Relative 7.7 3.0 - 12.0 %   Eosinophils Relative 2.3 0.0 - 5.0 %   Basophils Relative 0.7 0.0 - 3.0 %   Neutro Abs 7.6 1.4 - 7.7 K/uL   Lymphs Abs 1.7 0.7 - 4.0 K/uL   Monocytes Absolute 0.8 0.1 - 1.0 K/uL   Eosinophils Absolute 0.2 0.0 - 0.7 K/uL   Basophils Absolute 0.1 0.0 - 0.1 K/uL     Patient Active Problem List   Diagnosis Date Noted   GERD (gastroesophageal reflux disease) 05/19/2021   Bleeding internal hemorrhoids 06/13/2018   Hx of adenomatous polyp of colon 06/13/2018   Hypothyroid 01/04/2016   History of endometrial cancer 02/12/2013   Other screening mammogram 11/04/2011   Obesity 11/04/2011   Routine general medical examination at a health care facility 10/20/2011   PLANTAR FASCIITIS 12/31/2010   Hyperlipidemia 09/24/2010   MENOPAUSAL SYNDROME 07/06/2010   Past Medical History:  Diagnosis Date   COVID-19 03/2020   endometrial ca 02/12/13   endometrial, positive nodes   External hemorrhoids    History of radiation therapy 05/27/2013-07/01/2013   pelvis 45 gray in 25 fractions, 63 gray with high dose brachytherapy   Hx of adenomatous polyp of colon 06/13/2018   Hyperlipidemia    borderline high cholesterol   Hyperplastic colon polyp    Internal hemorrhoids    Migraine    Skin lesion    non maligant skin lesions on head   Past Surgical History:  Procedure Laterality Date   ABDOMINAL HYSTERECTOMY  02/12/13   COLONOSCOPY W/ BIOPSIES  06/15/2009   Dr Watt Climes - distal hyperplastic polyps   RHINOPLASTY     breathing problems- Dr Ernesto Rutherford   ROBOTIC ASSISTED TOTAL HYSTERECTOMY WITH BILATERAL SALPINGO OOPHERECTOMY N/A 02/12/2013   Procedure: ROBOTIC ASSISTED TOTAL HYSTERECTOMY WITH BILATERAL SALPINGO OOPHORECTOMY,  LYMPH NODES;  Surgeon: Imagene Gurney A. Alycia Rossetti, MD;  Location: [facility];  Service: Gynecology;  Laterality: N/A;   Social History   Tobacco Use    Smoking status: Never   Smokeless  tobacco: Never  Vaping Use   Vaping Use: Never used  Substance Use Topics   Alcohol use: No    Alcohol/week: 0.0 standard drinks   Drug use: No   Family History  Problem Relation Age of Onset   Diabetes Mother    Kidney disease Mother    Benign prostatic hyperplasia Father    Diabetes Maternal Grandmother    Colon cancer Neg Hx    No Known Allergies Current Outpatient Medications on File Prior to Visit  Medication Sig Dispense Refill   calcium carbonate (TUMS EX) 750 MG chewable tablet Chew 2 tablets by mouth 2 (two) times daily.     Cholecalciferol (VITAMIN D3 PO) Take 25 mg by mouth.     COLLAGEN PO Take 1 tablet by mouth daily.     Multiple Vitamin (MULTIVITAMIN) tablet Take 1 tablet by mouth daily.     Omega-3 Fatty Acids (FISH OIL) 1200 MG CAPS Take 1 capsule by mouth daily.     pantoprazole (PROTONIX) 20 MG tablet TAKE 1 TABLET BY MOUTH DAILY BEFORE BREAKFAST 90 tablet 3   No current facility-administered medications on file prior to visit.     Review of Systems  Constitutional:  Negative for activity change, appetite change, fatigue, fever and unexpected weight change.  HENT:  Negative for congestion, ear pain, rhinorrhea, sinus pressure and sore throat.   Eyes:  Negative for pain, redness and visual disturbance.  Respiratory:  Negative for cough, shortness of breath and wheezing.   Cardiovascular:  Negative for chest pain and palpitations.  Gastrointestinal:  Negative for abdominal pain, blood in stool, constipation and diarrhea.       GERD with sensation of something in esophagus  Endocrine: Negative for polydipsia and polyuria.  Genitourinary:  Negative for dysuria, frequency and urgency.  Musculoskeletal:  Negative for arthralgias, back pain and myalgias.  Skin:  Negative for pallor and rash.  Allergic/Immunologic: Negative for environmental allergies.  Neurological:  Negative for dizziness, syncope and headaches.   Hematological:  Negative for adenopathy. Does not bruise/bleed easily.  Psychiatric/Behavioral:  Negative for decreased concentration and dysphoric mood. The patient is not nervous/anxious.       Objective:   Physical Exam Constitutional:      General: She is not in acute distress.    Appearance: Normal appearance. She is well-developed. She is obese. She is not ill-appearing or diaphoretic.  HENT:     Head: Normocephalic and atraumatic.     Right Ear: Tympanic membrane, ear canal and external ear normal.     Left Ear: Tympanic membrane, ear canal and external ear normal.     Nose: Nose normal. No congestion.     Mouth/Throat:     Mouth: Mucous membranes are moist.     Pharynx: Oropharynx is clear. No posterior oropharyngeal erythema.  Eyes:     General: No scleral icterus.    Extraocular Movements: Extraocular movements intact.     Conjunctiva/sclera: Conjunctivae normal.     Pupils: Pupils are equal, round, and reactive to light.  Neck:     Thyroid: No thyromegaly.     Vascular: No carotid bruit or JVD.  Cardiovascular:     Rate and Rhythm: Normal rate and regular rhythm.     Pulses: Normal pulses.     Heart sounds: Normal heart sounds.    No gallop.  Pulmonary:     Effort: Pulmonary effort is normal. No respiratory distress.     Breath sounds: Normal breath sounds. No wheezing.  Comments: Good air exch Chest:     Chest wall: No tenderness.  Abdominal:     General: Bowel sounds are normal. There is no distension or abdominal bruit.     Palpations: Abdomen is soft. There is no mass.     Tenderness: There is no abdominal tenderness.     Hernia: No hernia is present.  Genitourinary:    Comments: Breast and pelvic exam done by gyn  Musculoskeletal:        General: No tenderness. Normal range of motion.     Cervical back: Normal range of motion and neck supple. No rigidity. No muscular tenderness.     Right lower leg: No edema.     Left lower leg: No edema.   Lymphadenopathy:     Cervical: No cervical adenopathy.  Skin:    General: Skin is warm and dry.     Coloration: Skin is not pale.     Findings: No erythema or rash.     Comments: Fair Some SKs on trunk No focal alopecia   Neurological:     Mental Status: She is alert. Mental status is at baseline.     Cranial Nerves: No cranial nerve deficit.     Motor: No abnormal muscle tone.     Coordination: Coordination normal.     Gait: Gait normal.     Deep Tendon Reflexes: Reflexes are normal and symmetric. Reflexes normal.  Psychiatric:        Attention and Perception: Attention normal.        Mood and Affect: Mood is anxious.        Cognition and Memory: Cognition and memory normal.     Comments: Pleasant           Assessment & Plan:   Problem List Items Addressed This Visit       Digestive   GERD (gastroesophageal reflux disease)    More reflux symptoms and dysphagia lately  Pt has f/u with GI soon  May have to inc her protonix to 40 mg  May need another esoph dilatation (of note also due for colonoscopy)  She will discuss those issues then  Also adv to watch diet best she can         Endocrine   Hypothyroid    TSH is higher and FT4 lower Pt notes she is sluggish and has some short term memory changes (mild)  Will start levothyroxine 25 mcg daily at least 30 min before food or other meds Plan TSH in 6 wk  Handout given re: hypothyroid       Relevant Medications   levothyroxine (SYNTHROID) 25 MCG tablet   Other Relevant Orders   TSH     Other   Hyperlipidemia    Disc goals for lipids and reasons to control them Rev last labs with pt Rev low sat fat diet in detail Fair control with atorvastatin 10 mg daily  Enc to eat lean protein       Relevant Medications   atorvastatin (LIPITOR) 10 MG tablet   Routine general medical examination at a health care facility - Primary    Reviewed health habits including diet and exercise and skin cancer  prevention Reviewed appropriate screening tests for age  Also reviewed health mt list, fam hx and immunization status , as well as social and family history   See HPI Labs reviewed  Will start tx for hypothyroidism Planning f/u with GI to discuss recall colonoscopy  Declines covid vaccines, offered  to answer questions Other imms utd Mammogram utd Sent for last pap and dexa from new gyn (past h/o endom cancer and doing well)       Obesity    Discussed how this problem influences overall health and the risks it imposes  Reviewed plan for weight loss with lower calorie diet (via better food choices and also portion control or program like weight watchers) and exercise building up to or more than 30 minutes 5 days per week including some aerobic activity   End her to keep working on wt loss  Commended -walking        Hx of adenomatous polyp of colon    Due for colonoscopy next mo Pt has a f/u appt with GI next month and will discuss it then

## 2021-05-19 NOTE — Assessment & Plan Note (Signed)
Due for colonoscopy next mo Pt has a f/u appt with GI next month and will discuss it then

## 2021-05-19 NOTE — Assessment & Plan Note (Signed)
TSH is higher and FT4 lower Pt notes she is sluggish and has some short term memory changes (mild)  Will start levothyroxine 25 mcg daily at least 30 min before food or other meds Plan TSH in 6 wk  Handout given re: hypothyroid

## 2021-05-19 NOTE — Patient Instructions (Addendum)
For weight loss Try to get most of your carbohydrates from produce (with the exception of white potatoes)  Eat less bread/pasta/rice/snack foods/cereals/sweets and other items from the middle of the grocery store (processed carbs)  Don't forget to schedule your colonoscopy when you are at the GI office next month   I think you are hypothyroid  Take the levothyroxine 25 mcg once daily in the am at least 30 minutes before food or other medicines  Re check tsh in about 6 weeks

## 2021-05-19 NOTE — Assessment & Plan Note (Signed)
Discussed how this problem influences overall health and the risks it imposes  Reviewed plan for weight loss with lower calorie diet (via better food choices and also portion control or program like weight watchers) and exercise building up to or more than 30 minutes 5 days per week including some aerobic activity   End her to keep working on wt loss  Commended -walking

## 2021-05-19 NOTE — Assessment & Plan Note (Signed)
Reviewed health habits including diet and exercise and skin cancer prevention Reviewed appropriate screening tests for age  Also reviewed health mt list, fam hx and immunization status , as well as social and family history   See HPI Labs reviewed  Will start tx for hypothyroidism Planning f/u with GI to discuss recall colonoscopy  Declines covid vaccines, offered to answer questions Other imms utd Mammogram utd Sent for last pap and dexa from new gyn (past h/o endom cancer and doing well)

## 2021-06-16 ENCOUNTER — Ambulatory Visit: Payer: 59 | Admitting: Physician Assistant

## 2021-06-16 ENCOUNTER — Encounter: Payer: Self-pay | Admitting: Physician Assistant

## 2021-06-16 VITALS — BP 140/86 | HR 96 | Ht 64.0 in | Wt 191.2 lb

## 2021-06-16 DIAGNOSIS — K219 Gastro-esophageal reflux disease without esophagitis: Secondary | ICD-10-CM

## 2021-06-16 DIAGNOSIS — K449 Diaphragmatic hernia without obstruction or gangrene: Secondary | ICD-10-CM | POA: Diagnosis not present

## 2021-06-16 DIAGNOSIS — R1319 Other dysphagia: Secondary | ICD-10-CM | POA: Diagnosis not present

## 2021-06-16 DIAGNOSIS — Z86010 Personal history of colonic polyps: Secondary | ICD-10-CM | POA: Diagnosis not present

## 2021-06-16 MED ORDER — PANTOPRAZOLE SODIUM 20 MG PO TBEC: DELAYED_RELEASE_TABLET | ORAL | 3 refills | Status: DC

## 2021-06-16 NOTE — Progress Notes (Signed)
Chief Complaint: Follow-up GERD and history of adenomatous polyps  HPI:    Jane Ruiz is a 60 year old female with a past medical history as listed below including endometrial cancer and reflux, known to Dr. Carlean Purl, who presents to clinic today for follow-up of GERD and history of adenomatous polyp.    07/28/2017 EGD with benign-appearing esophageal stenosis dilated to 18 mm.  7 cm hiatal hernia.    06/13/2018 colonoscopy with a 10 mm tubular adenoma in the sigmoid colon and internal hemorrhoids.  Repeat recommended in 3 years.    07/06/2020 patient seen in clinic by Dr. Carlean Purl for follow-up of GERD with esophageal stricture.  At that time was doing well on pantoprazole 20 mg daily.  Repeat colonoscopy was recommended in 2022 due to history of adenomatous polyps.    05/18/2021 patient called with a complaint that her "hiatal hernia" was getting worse and she was requesting to speak with a nurse in regards to Protonix.  Dr. Carlean Purl recommended that she use 20 mg of pantoprazole in the morning and again before dinner.    Today, the patient explains that she feels like she has issues where "my hernia gets bigger", and describes symptoms like feeling like she cannot swallow which increases her anxiety and makes everything worse.  This occurs off and on, she can never predict it, not specifically brought on by eating or drinking.  Occurs maybe 2 or 3 times a week.  Tells me she has been increasing her Pantoprazole and taking another tablet when she feels the symptoms and thinks that may be it works a little bit.  She is apprehensive about taking this medicine at a higher dose all the time because she is seeing "commercials about it causing cancer".    Denies fever, chills, weight loss or change in bowel habits  Past Medical History:  Diagnosis Date   COVID-19 03/2020   endometrial ca 02/12/13   endometrial, positive nodes   External hemorrhoids    History of radiation therapy 05/27/2013-07/01/2013    pelvis 45 gray in 25 fractions, 63 gray with high dose brachytherapy   Hx of adenomatous polyp of colon 06/13/2018   Hyperlipidemia    borderline high cholesterol   Hyperplastic colon polyp    Internal hemorrhoids    Migraine    Skin lesion    non maligant skin lesions on head    Past Surgical History:  Procedure Laterality Date   ABDOMINAL HYSTERECTOMY  02/12/13   COLONOSCOPY W/ BIOPSIES  06/15/2009   Dr Watt Climes - distal hyperplastic polyps   RHINOPLASTY     breathing problems- Dr Ernesto Rutherford   ROBOTIC ASSISTED TOTAL HYSTERECTOMY WITH BILATERAL SALPINGO OOPHERECTOMY N/A 02/12/2013   Procedure: ROBOTIC ASSISTED TOTAL HYSTERECTOMY WITH BILATERAL SALPINGO OOPHORECTOMY,  LYMPH NODES;  Surgeon: Imagene Gurney A. Alycia Rossetti, MD;  Location: [facility];  Service: Gynecology;  Laterality: N/A;    Current Outpatient Medications  Medication Sig Dispense Refill   atorvastatin (LIPITOR) 10 MG tablet Take 1 tablet (10 mg total) by mouth every evening. 90 tablet 3   calcium carbonate (TUMS EX) 750 MG chewable tablet Chew 2 tablets by mouth 2 (two) times daily.     Cholecalciferol (VITAMIN D3 PO) Take 25 mg by mouth.     COLLAGEN PO Take 1 tablet by mouth daily.     levothyroxine (SYNTHROID) 25 MCG tablet Take 1 tablet (25 mcg total) by mouth daily. 90 tablet 3   Multiple Vitamin (MULTIVITAMIN) tablet Take 1 tablet by mouth daily.  Omega-3 Fatty Acids (FISH OIL) 1200 MG CAPS Take 1 capsule by mouth daily.     pantoprazole (PROTONIX) 20 MG tablet TAKE 1 TABLET BY MOUTH DAILY BEFORE BREAKFAST 90 tablet 3   No current facility-administered medications for this visit.    Allergies as of 06/16/2021   (No Known Allergies)    Family History  Problem Relation Age of Onset   Diabetes Mother    Kidney disease Mother    Benign prostatic hyperplasia Father    Diabetes Maternal Grandmother    Colon cancer Neg Hx     Social History   Socioeconomic History   Marital status: Married    Spouse name: Not on file    Number of children: 1   Years of education: Not on file   Highest education level: Not on file  Occupational History   Occupation: Producer, 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[employer]  Tobacco Use   Smoking status: Never   Smokeless tobacco: Never  Vaping Use   Vaping Use: Never used  Substance and Sexual Activity   Alcohol use: No    Alcohol/week: 0.0 standard drinks   Drug use: No   Sexual activity: Yes    Partners: Male    Comment: 1st intercourse- 76, partners- 1,married-45 yrs  Other Topics Concern   Not on file  Social History Narrative   Married one daughter born 1982 2 grandchildren   Network engineer at [employer]   1 cup of caffeine daily   07/18/2017   Social Determinants of Health   Financial Resource Strain: Not on file  Food Insecurity: Not on file  Transportation Needs: Not on file  Physical Activity: Not on file  Stress: Not on file  Social Connections: Not on file  Intimate Partner Violence: Not on file    Review of Systems:    Constitutional: No weight loss, fever or chills Cardiovascular: No chest pain Respiratory: No SOB  Gastrointestinal: See HPI and otherwise negative   Physical Exam:  Vital signs: BP 140/86   Pulse 96   Ht 5\' 4"  (1.626 m)   Wt 191 lb 3.2 oz (86.7 kg)   BMI 32.82 kg/m    Constitutional:   Pleasant Caucasian female appears to be in NAD, Well developed, Well nourished, alert and cooperative Respiratory: Respirations even and unlabored. Lungs clear to auscultation bilaterally.   No wheezes, crackles, or rhonchi.  Cardiovascular: Normal S1, S2. No MRG. Regular rate and rhythm. No peripheral edema, cyanosis or pallor.  Gastrointestinal:  Soft, nondistended, nontender. No rebound or guarding. Normal bowel sounds. No appreciable masses or hepatomegaly. Rectal:  Not performed.  Psychiatric: Demonstrates good judgement and reason without abnormal affect or behaviors. +anxious  RELEVANT LABS AND IMAGING: CBC    Component  Value Date/Time   WBC 10.4 05/12/2021 0815   RBC 4.87 05/12/2021 0815   HGB 13.1 05/12/2021 0815   HGB 12.2 11/25/2013 0754   HCT 39.8 05/12/2021 0815   HCT 37.1 11/25/2013 0754   PLT 322.0 05/12/2021 0815   PLT 221 11/25/2013 0754   MCV 81.8 05/12/2021 0815   MCV 90.7 11/25/2013 0754   MCH 29.9 11/25/2013 0754   MCH 27.9 02/13/2013 0409   MCHC 32.9 05/12/2021 0815   RDW 15.0 05/12/2021 0815   RDW 14.8 (H) 11/25/2013 0754   LYMPHSABS 1.7 05/12/2021 0815   LYMPHSABS 0.6 (L) 11/25/2013 0754   MONOABS 0.8 05/12/2021 0815   MONOABS 0.6 11/25/2013 0754   EOSABS 0.2 05/12/2021 0815  EOSABS 0.4 11/25/2013 0754   BASOSABS 0.1 05/12/2021 0815   BASOSABS 0.1 11/25/2013 0754    CMP     Component Value Date/Time   NA 142 05/12/2021 0815   NA 142 11/25/2013 0754   K 4.2 05/12/2021 0815   K 3.4 (L) 11/25/2013 0754   CL 106 05/12/2021 0815   CL 105 05/15/2013 1249   CO2 27 05/12/2021 0815   CO2 27 11/25/2013 0754   GLUCOSE 91 05/12/2021 0815   GLUCOSE 97 11/25/2013 0754   GLUCOSE 119 (H) 05/15/2013 1249   BUN 15 05/12/2021 0815   BUN 9.8 11/25/2013 0754   CREATININE 0.82 05/12/2021 0815   CREATININE 0.8 11/25/2013 0754   CALCIUM 9.6 05/12/2021 0815   CALCIUM 9.5 11/25/2013 0754   PROT 6.9 05/12/2021 0815   PROT 7.1 11/25/2013 0754   ALBUMIN 4.2 05/12/2021 0815   ALBUMIN 3.6 11/25/2013 0754   AST 13 05/12/2021 0815   AST 15 11/25/2013 0754   ALT 11 05/12/2021 0815   ALT 15 11/25/2013 0754   ALKPHOS 90 05/12/2021 0815   ALKPHOS 81 11/25/2013 0754   BILITOT 0.5 05/12/2021 0815   BILITOT 0.28 11/25/2013 0754   GFRNONAA >90 02/13/2013 0409   GFRAA >90 02/13/2013 0409    Assessment: 1.  GERD: Mostly controlled on Pantoprazole 20 mg daily, but occasionally has issues with "trouble swallowing", which she relates to her hiatal hernia; consider stricture versus GERD versus esophagitis versus hiatal hernia versus other 2.  Hiatal hernia: 7 cm at time of last EGD in 2018,  patient feels like she is having swallowing difficulty due to this at time 3.  History of adenomatous polyps: Last colonoscopy 3 years ago with repeat recommended in 3 years  Plan: 1.  Scheduled patient for a surveillance colonoscopy and diagnostic EGD.  Patient specifically wanted these with Dr. Carlean Purl.  Did provide the patient with a detailed list of risks for the procedures and she agrees to proceed. 2.  Discussed that patient can take Pantoprazole 20 mg twice daily if needed.  Discussed research done on these medications and that cancer is low risk given this dosage. 3.  Refilled patient's Pantoprazole 20 mg once daily per her request.  She tells me that if Dr. Carlean Purl think she needs to be on more after time of EGD then he can send in more then. 4.  Patient to follow in clinic per recommendations after time of procedures.  Ellouise Newer, PA-C [facility] 06/16/2021, 10:53 AM  Cc: Tower, Wynelle Fanny, MD

## 2021-06-16 NOTE — Patient Instructions (Signed)
We have sent the following medications to your pharmacy for you to pick up at your convenience: Pantoprazole.  You have been scheduled for an endoscopy and colonoscopy. Please follow the written instructions given to you at your visit today. Please pick up your prep supplies at the pharmacy within the next 1-3 days. If you use inhalers (even only as needed), please bring them with you on the day of your procedure.  If you are age 62 or older, your body mass index should be between 23-30. Your Body mass index is 32.82 kg/m. If this is out of the aforementioned range listed, please consider follow up with your Primary Care Provider.  If you are age 76 or younger, your body mass index should be between 19-25. Your Body mass index is 32.82 kg/m. If this is out of the aformentioned range listed, please consider follow up with your Primary Care Provider.   __________________________________________________________  The [facility] providers would like to encourage you to use [facility] to communicate with providers for non-urgent requests or questions.  Due to long hold times on the telephone, sending your provider a message by [facility] may be a faster and more efficient way to get a response.  Please allow 48 business hours for a response.  Please remember that this is for non-urgent requests.

## 2021-06-30 ENCOUNTER — Other Ambulatory Visit (INDEPENDENT_AMBULATORY_CARE_PROVIDER_SITE_OTHER): Payer: 59

## 2021-06-30 ENCOUNTER — Other Ambulatory Visit: Payer: Self-pay

## 2021-06-30 DIAGNOSIS — E039 Hypothyroidism, unspecified: Secondary | ICD-10-CM | POA: Diagnosis not present

## 2021-06-30 LAB — TSH: TSH: 5.34 u[IU]/mL (ref 0.35–5.50)

## 2021-07-19 ENCOUNTER — Encounter: Payer: Self-pay | Admitting: Internal Medicine

## 2021-07-19 ENCOUNTER — Other Ambulatory Visit: Payer: Self-pay

## 2021-07-19 ENCOUNTER — Ambulatory Visit (AMBULATORY_SURGERY_CENTER): Payer: 59 | Admitting: Internal Medicine

## 2021-07-19 VITALS — BP 137/61 | HR 74 | Temp 97.5°F | Resp 11 | Ht 64.0 in | Wt 191.0 lb

## 2021-07-19 DIAGNOSIS — K222 Esophageal obstruction: Secondary | ICD-10-CM

## 2021-07-19 DIAGNOSIS — K449 Diaphragmatic hernia without obstruction or gangrene: Secondary | ICD-10-CM | POA: Diagnosis not present

## 2021-07-19 DIAGNOSIS — K317 Polyp of stomach and duodenum: Secondary | ICD-10-CM | POA: Diagnosis not present

## 2021-07-19 DIAGNOSIS — K219 Gastro-esophageal reflux disease without esophagitis: Secondary | ICD-10-CM

## 2021-07-19 DIAGNOSIS — Z86010 Personal history of colonic polyps: Secondary | ICD-10-CM

## 2021-07-19 MED ORDER — SODIUM CHLORIDE 0.9 % IV SOLN: 500.0000 mL | Freq: Once | INTRAVENOUS | Status: DC

## 2021-07-19 MED ORDER — PANTOPRAZOLE SODIUM 40 MG PO TBEC: 40.0000 mg | DELAYED_RELEASE_TABLET | Freq: Every day | ORAL | 3 refills | Status: DC

## 2021-07-19 NOTE — Patient Instructions (Addendum)
I dilated the esophagus again today. I have changed pantoprazole to 40 mng daily. Still have a large hiatal hernia.   If you have one of those anxiety-provoking situations, try this:  Box breathing  Inhale to a count of 4; hold that 4 count; exhale over 4 count; hold exhale x 4  Then repeat this - a few times   We will make you a follow-up to see me in October. Will review things, make sure you are ok and discuss the hernia and GERD some more.  I did not see any polyps in the colon. Your next routine colonoscopy should be in 5 years - 2027.  I appreciate the opportunity to care for you. Gatha Mayer, MD, [facility]   Please read handouts provided. Continue present medications. Follow-up appointment with Dr. Carlean Purl September 20, 2021 at 10:50 am. Repeat colonoscopy in 5 years for screening. Follow antireflux measures..  YOU HAD AN ENDOSCOPIC PROCEDURE TODAY AT [facility]:   Refer to the procedure report that was given to you for any specific questions about what was found during the examination.  If the procedure report does not answer your questions, please call your gastroenterologist to clarify.  If you requested that your care partner not be given the details of your procedure findings, then the procedure report has been included in a sealed envelope for you to review at your convenience later.  YOU SHOULD EXPECT: Some feelings of bloating in the abdomen. Passage of more gas than usual.  Walking can help get rid of the air that was put into your GI tract during the procedure and reduce the bloating. If you had a lower endoscopy (such as a colonoscopy or flexible sigmoidoscopy) you may notice spotting of blood in your stool or on the toilet paper. If you underwent a bowel prep for your procedure, you may not have a normal bowel movement for a few days.  Please Note:  You might notice some irritation and congestion in your nose or some drainage.  This is from the oxygen  used during your procedure.  There is no need for concern and it should clear up in a day or so.  SYMPTOMS TO REPORT IMMEDIATELY:  Following lower endoscopy (colonoscopy or flexible sigmoidoscopy):  Excessive amounts of blood in the stool  Significant tenderness or worsening of abdominal pains  Swelling of the abdomen that is new, acute  Fever of 100F or higher  Following upper endoscopy (EGD)  Vomiting of blood or coffee ground material  New chest pain or pain under the shoulder blades  Painful or persistently difficult swallowing  New shortness of breath  Fever of 100F or higher  Black, tarry-looking stools  For urgent or emergent issues, a gastroenterologist can be reached at any hour by calling [phone]. Do not use MyChart messaging for urgent concerns.    DIET:  We do recommend a small meal at first, but then you may proceed to your regular diet.  Drink plenty of fluids but you should avoid alcoholic beverages for 24 hours.  ACTIVITY:  You should plan to take it easy for the rest of today and you should NOT DRIVE or use heavy machinery until tomorrow (because of the sedation medicines used during the test).    FOLLOW UP: Our staff will call the number listed on your records 48-72 hours following your procedure to check on you and address any questions or concerns that you may have regarding the information given to  you following your procedure. If we do not reach you, we will leave a message.  We will attempt to reach you two times.  During this call, we will ask if you have developed any symptoms of COVID 19. If you develop any symptoms (ie: fever, flu-like symptoms, shortness of breath, cough etc.) before then, please call [phone].  If you test positive for Covid 19 in the 2 weeks post procedure, please call and report this information to [place].    If any biopsies were taken you will be contacted by phone or by letter within the next 1-3 weeks.  Please call us at [phone] if you have not heard about the biopsies in 3 weeks.    SIGNATURES/CONFIDENTIALITY: You and/or your care partner have signed paperwork which will be entered into your electronic medical record.  These signatures attest to the fact that that the information above on your After Visit Summary has been reviewed and is understood.  Full responsibility of the confidentiality of this discharge information lies with you and/or your care-partner.

## 2021-07-19 NOTE — Progress Notes (Signed)
[facility] History and Physical   Primary Care Physician:  Tower, Wynelle Fanny, MD   Reason for Procedure:   Dysphagia, globus and hx polyps  Plan:    Egd, dilation, colonoscopy     HPI: Jane Ruiz is a 60 y.o. female here for evaluation and treatment of dyshaga + globus, GERD and hx colon polyps   Past Medical History:  Diagnosis Date   COVID-19 03/2020   endometrial ca 02/12/2013   endometrial, positive nodes   External hemorrhoids    GERD (gastroesophageal reflux disease)    History of radiation therapy 05/27/2013-07/01/2013   pelvis 45 gray in 25 fractions, 63 gray with high dose brachytherapy   Hx of adenomatous polyp of colon 06/13/2018   Hyperlipidemia    borderline high cholesterol   Hyperplastic colon polyp    Internal hemorrhoids    Migraine    Skin lesion    non maligant skin lesions on head    Past Surgical History:  Procedure Laterality Date   ABDOMINAL HYSTERECTOMY  02/12/13   COLONOSCOPY W/ BIOPSIES  06/15/2009   Dr Watt Climes - distal hyperplastic polyps   RHINOPLASTY     breathing problems- Dr Ernesto Rutherford   ROBOTIC ASSISTED TOTAL HYSTERECTOMY WITH BILATERAL SALPINGO OOPHERECTOMY N/A 02/12/2013   Procedure: ROBOTIC ASSISTED TOTAL HYSTERECTOMY WITH BILATERAL SALPINGO OOPHORECTOMY,  LYMPH NODES;  Surgeon: Imagene Gurney A. Alycia Rossetti, MD;  Location: [facility];  Service: Gynecology;  Laterality: N/A;    Prior to Admission medications   Medication Sig Start Date End Date Taking? Authorizing Provider  atorvastatin (LIPITOR) 10 MG tablet Take 1 tablet (10 mg total) by mouth every evening. 05/19/21  Yes Tower, Wynelle Fanny, MD  Cholecalciferol (VITAMIN D3 PO) Take 25 mg by mouth.   Yes [provider]  COLLAGEN PO Take 1 tablet by mouth daily.   Yes [provider]  levothyroxine (SYNTHROID) 25 MCG tablet Take 1 tablet (25 mcg total) by mouth daily. 05/19/21  Yes Tower, Wynelle Fanny, MD  pantoprazole (PROTONIX) 20 MG tablet TAKE 1 TABLET BY MOUTH DAILY BEFORE  BREAKFAST 06/16/21  Yes Levin Erp, PA  calcium carbonate (TUMS EX) 750 MG chewable tablet Chew 2 tablets by mouth 2 (two) times daily.    [provider]  Multiple Vitamin (MULTIVITAMIN) tablet Take 1 tablet by mouth daily.    [provider]  Omega-3 Fatty Acids (FISH OIL) 1200 MG CAPS Take 1 capsule by mouth daily.    [provider]    Current Outpatient Medications  Medication Sig Dispense Refill   atorvastatin (LIPITOR) 10 MG tablet Take 1 tablet (10 mg total) by mouth every evening. 90 tablet 3   Cholecalciferol (VITAMIN D3 PO) Take 25 mg by mouth.     COLLAGEN PO Take 1 tablet by mouth daily.     levothyroxine (SYNTHROID) 25 MCG tablet Take 1 tablet (25 mcg total) by mouth daily. 90 tablet 3   pantoprazole (PROTONIX) 20 MG tablet TAKE 1 TABLET BY MOUTH DAILY BEFORE BREAKFAST 90 tablet 3   calcium carbonate (TUMS EX) 750 MG chewable tablet Chew 2 tablets by mouth 2 (two) times daily.     Multiple Vitamin (MULTIVITAMIN) tablet Take 1 tablet by mouth daily.     Omega-3 Fatty Acids (FISH OIL) 1200 MG CAPS Take 1 capsule by mouth daily.     Current Facility-Administered Medications  Medication Dose Route Frequency Provider Last Rate Last Admin   0.9 %  sodium chloride infusion  500 mL Intravenous Once Carlean Purl,  Ofilia Neas, MD        Allergies as of 07/19/2021   (No Known Allergies)    Family History  Problem Relation Age of Onset   Diabetes Mother    Kidney disease Mother    Benign prostatic hyperplasia Father    Diabetes Maternal Grandmother    Colon cancer Neg Hx    Esophageal cancer Neg Hx    Stomach cancer Neg Hx     Social History   Socioeconomic History   Marital status: Married    Spouse name: Not on file   Number of children: 1   Years of education: Not on file   Highest education level: Not on file  Occupational History   Occupation: Producer, 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[employer]  Tobacco Use   Smoking status: Never    Smokeless tobacco: Never  Vaping Use   Vaping Use: Never used  Substance and Sexual Activity   Alcohol use: No    Alcohol/week: 0.0 standard drinks   Drug use: No   Sexual activity: Yes    Partners: Male    Comment: 1st intercourse- 69, partners- 1,married-45 yrs  Other Topics Concern   Not on file  Social History Narrative   Married one daughter born 1982 2 grandchildren   Network engineer at [employer]   1 cup of caffeine daily   07/18/2017   Social Determinants of Health   Financial Resource Strain: Not on file  Food Insecurity: Not on file  Transportation Needs: Not on file  Physical Activity: Not on file  Stress: Not on file  Social Connections: Not on file  Intimate Partner Violence: Not on file    Review of Systems: Positive for anxiety All other review of systems negative except as mentioned in the HPI.  Physical Exam: Vital signs BP (!) 160/80   Pulse 77   Temp (!) 97.5 F (36.4 C)   Resp 12   Ht '5\' 4"'$  (1.626 m)   Wt 191 lb (86.6 kg)   SpO2 90%   BMI 32.79 kg/m   General:   Alert,  Well-developed, well-nourished, pleasant and cooperative in NAD Lungs:  Clear throughout to auscultation.   Heart:  Regular rate and rhythm; no murmurs, clicks, rubs,  or gallops. Abdomen:  Soft, nontender and nondistended. Normal bowel sounds.   Neuro/Psych:  Alert and cooperative. Normal mood and affect. A and O x 3   '@Oneal Schoenberger'$  Simonne Maffucci, MD, [facility] [phone] (pager) 07/19/2021 8:10 AM@

## 2021-07-19 NOTE — Op Note (Signed)
[place] Patient Name: Jane Ruiz Procedure Date: 07/19/2021 7:30 AM MRN: [id number] Endoscopist: Gatha Mayer , MD Age: 60 Referring MD:  Date of Birth: May 24, 1961 Gender: Female Account #: [id number] Procedure:                Colonoscopy Indications:              Surveillance: Personal history of adenomatous                            polyps on last colonoscopy 3 years ago, Last                            colonoscopy: 2019 Medicines:                Propofol per Anesthesia, Monitored Anesthesia Care Procedure:                Pre-Anesthesia Assessment:                           - Prior to the procedure, a History and Physical                            was performed, and patient medications and                            allergies were reviewed. The patient's tolerance of                            previous anesthesia was also reviewed. The risks                            and benefits of the procedure and the sedation                            options and risks were discussed with the patient.                            All questions were answered, and informed consent                            was obtained. Prior Anticoagulants: The patient has                            taken no previous anticoagulant or antiplatelet                            agents. ASA Grade Assessment: II - A patient with                            mild systemic disease. After reviewing the risks                            and benefits, the patient was deemed in  satisfactory condition to undergo the procedure.                           After obtaining informed consent, the colonoscope                            was passed under direct vision. Throughout the                            procedure, the patient's blood pressure, pulse, and                            oxygen saturations were monitored continuously. The                            Olympus PCF-H190DL  [id number]) Colonoscope was                            introduced through the anus and advanced to the the                            cecum, identified by appendiceal orifice and                            ileocecal valve. The colonoscopy was somewhat                            difficult due to restricted mobility of the colon,                            significant looping and a tortuous colon.                            Successful completion of the procedure was aided by                            using manual pressure, withdrawing the scope and                            replacing with the pediatric colonoscope and                            straightening and shortening the scope to obtain                            bowel loop reduction. The quality of the bowel                            preparation was excellent. The ileocecal valve,                            appendiceal orifice, and rectum were photographed. Scope In: 8:23:08 AM Scope Out: 8:44:52 AM Scope Withdrawal Time: 0 hours 9 minutes 30 seconds  Total Procedure Duration:  0 hours 21 minutes 44 seconds  Findings:                 The perianal and digital rectal examinations were                            normal.                           Multiple diverticula were found in the sigmoid                            colon. There was narrowing of the colon in                            association with the diverticular opening.                           The exam was otherwise without abnormality on                            direct and retroflexion views. Complications:            No immediate complications. Estimated Blood Loss:     Estimated blood loss: none. Impression:               - Diverticulosis in the sigmoid colon. There was                            narrowing of the colon in association with the                            diverticular opening.                           - The examination was otherwise normal on direct                             and retroflexion views.                           - No specimens collected. Recommendation:           - Patient has a contact number available for                            emergencies. The signs and symptoms of potential                            delayed complications were discussed with the                            patient. Return to normal activities tomorrow.                            Written discharge instructions were provided to the  patient.                           - Continue present medications.                           - Repeat colonoscopy in 5 years for surveillance. Gatha Mayer, MD 07/19/2021 9:07:29 AM This report has been signed electronically.

## 2021-07-19 NOTE — Progress Notes (Signed)
Called to room to assist during endoscopic procedure.  Patient ID and intended procedure confirmed with present staff. Received instructions for my participation in the procedure from the performing physician.  

## 2021-07-19 NOTE — Op Note (Signed)
[place] Patient Name: Jane Ruiz Procedure Date: 07/19/2021 7:31 AM MRN: [id number] Endoscopist: Gatha Mayer , MD Age: 60 Referring MD:  Date of Birth: Mar 23, 1961 Gender: Female Account #: [id number] Procedure:                Upper GI endoscopy Indications:              Dysphagia, Stenosis of the esophagus, For therapy                            of esophageal stenosis Medicines:                Propofol per Anesthesia, Monitored Anesthesia Care Procedure:                Pre-Anesthesia Assessment:                           - Prior to the procedure, a History and Physical                            was performed, and patient medications and                            allergies were reviewed. The patient's tolerance of                            previous anesthesia was also reviewed. The risks                            and benefits of the procedure and the sedation                            options and risks were discussed with the patient.                            All questions were answered, and informed consent                            was obtained. Prior Anticoagulants: The patient has                            taken no previous anticoagulant or antiplatelet                            agents. ASA Grade Assessment: II - A patient with                            mild systemic disease. After reviewing the risks                            and benefits, the patient was deemed in                            satisfactory condition to undergo the procedure.  After obtaining informed consent, the endoscope was                            passed under direct vision. Throughout the                            procedure, the patient's blood pressure, pulse, and                            oxygen saturations were monitored continuously. The                            Endoscope was introduced through the mouth, and                            advanced  to the antrum of the stomach. The upper GI                            endoscopy was accomplished without difficulty. The                            patient tolerated the procedure well. Scope In: Scope Out: Findings:                 One benign-appearing, intrinsic moderate                            (circumferential scarring or stenosis; an endoscope                            may pass) stenosis was found at the                            gastroesophageal junction. The stenosis was                            traversed. A TTS dilator was passed through the                            scope. Dilation with an 18-19-20 mm balloon dilator                            was performed to 20 mm. The dilation site was                            examined and showed mild mucosal disruption.                            Estimated blood loss was minimal.                           The gastroesophageal flap valve was visualized                            endoscopically and  classified as Hill Grade IV (no                            fold, wide open lumen, hiatal hernia present).                           A 10 cm hiatal hernia was present.                           Multiple diminutive sessile polyps were found in                            the gastric fundus and in the gastric body.                           The exam was otherwise without abnormality. Complications:            No immediate complications. Estimated Blood Loss:     Estimated blood loss was minimal. Impression:               - Benign-appearing esophageal stenosis. Dilated.                           - Gastroesophageal flap valve classified as Hill                            Grade IV (no fold, wide open lumen, hiatal hernia                            present).                           - 10 cm hiatal hernia.                           - Multiple gastric polyps. Classic appearance of                            benign fundic gland polyps.                            - The examination was otherwise normal.                           - No specimens collected. Recommendation:           - Patient has a contact number available for                            emergencies. The signs and symptoms of potential                            delayed complications were discussed with the                            patient. Return to normal activities tomorrow.  Written discharge instructions were provided to the                            patient.                           - Clear liquids x 1 hour then soft foods rest of                            day. Start prior diet tomorrow.                           - Continue present medications.                           - Follow an antireflux regimen.                           This includes:                           - Do not lie down for at least 3 to 4 hours after                            meals.                           - Raise the head of the bed 4 to 6 inches.                           - Decrease excess weight.                           - Avoid citrus juices and other acidic foods,                            alcohol, chocolate, mints, coffee and other                            caffeinated beverages, carbonated beverages, fatty                            and fried foods.                           - Avoid tight-fitting clothing.                           - Avoid cigarettes and other tobacco products.                           - CHANGE PANTOPRAZOLE FROM 20 TO 40 MG DAILY                           SEE ME IN OCTOBER - WILL SET UP APPOINTMENT TODAY  GERD LIFESTYLE TREATMENT                           DISCUSS HERNIA REPAIR AS A TREATMENT OPTION                           BOX BREATHING FOR STRESS RECOMMENDED Gatha Mayer, MD 07/19/2021 9:05:10 AM This report has been signed electronically.

## 2021-07-19 NOTE — Progress Notes (Signed)
Report to PACU, RN, vss, BBS= Clear.  

## 2021-07-21 ENCOUNTER — Telehealth: Payer: Self-pay | Admitting: *Deleted

## 2021-07-21 NOTE — Telephone Encounter (Signed)
  Follow up Call-  Call back number 07/19/2021  Post procedure Call Back phone  # [phone]  Permission to leave phone message Yes  Some recent data might be hidden     Patient questions:  Do you have a fever, pain , or abdominal swelling? No. Pain Score  0 *  Have you tolerated food without any problems? Yes.    Have you been able to return to your normal activities? Yes.    Do you have any questions about your discharge instructions: Diet   No. Medications  No. Follow up visit  No.  Do you have questions or concerns about your Care? No.  Actions: * If pain score is 4 or above: No action needed, pain <4.

## 2021-09-20 ENCOUNTER — Encounter: Payer: Self-pay | Admitting: Internal Medicine

## 2021-09-20 ENCOUNTER — Ambulatory Visit (INDEPENDENT_AMBULATORY_CARE_PROVIDER_SITE_OTHER): Payer: 59 | Admitting: Internal Medicine

## 2021-09-20 VITALS — BP 132/79 | HR 79 | Ht 64.0 in | Wt 193.4 lb

## 2021-09-20 DIAGNOSIS — K222 Esophageal obstruction: Secondary | ICD-10-CM

## 2021-09-20 DIAGNOSIS — K219 Gastro-esophageal reflux disease without esophagitis: Secondary | ICD-10-CM

## 2021-09-20 DIAGNOSIS — K449 Diaphragmatic hernia without obstruction or gangrene: Secondary | ICD-10-CM | POA: Diagnosis not present

## 2021-09-20 NOTE — Patient Instructions (Addendum)
Proton pump inhibitors or "PPI" medications are very effective medications used in the treatment of gastroesophageal reflux disease and ulcers. There has been much written lately about potential problems with these medications. Reported possible problems to include dementia, kidney disease, osteoporosis or thinning of the bones, and increased risk of Clostridium difficile infection.  When one looks at the research around this what we can say is there are associations and possible cause and effect but not clearly proven and there are some studies that show there is no increased risk or association.  It is very important to sort out if a medication as needed and needed on a continued basis, and understand the risks versus benefits of taking it or not taking it.  As we discussed today, I think the benefits of taking a PPI outweigh the possible risks in your case. Plesae continue taking your pantoprazole.  I appreciate the opportunity to care for you. Silvano Rusk, MD, [facility]

## 2021-09-20 NOTE — Progress Notes (Signed)
Jane Ruiz 60 y.o. [birth year] [id number]  Assessment & Plan:   Encounter Diagnoses  Name Primary?   GERD with stricture Yes   Hiatal hernia-10 cm-large     We reviewed the pros and cons of hiatal hernia repair.  Plan at this point is to continue medical therapy and she will try to modify diet and to lose some weight.  Handout on appropriate macronutrients to eat with an emphasis on reducing processed carbohydrates provided to the patient and her husband.  I have reviewed the indications, risks and benefits of PPI therapy with the patient today. I have discussed studies that raise ? of increased osteoporosis, dementia and kidney failure and explained that these studies show very weak associations of unclear significance and not clear cause and effect. We have agreed to continue PPI treatment in this case.  I did provide a handout about laparoscopic hiatal hernia repair and fundoplication procedures.  Return visit routinely within 2 years sooner as needed she should be able to get PPI through primary care though I can prescribe it if I see her every 2 years and she is well.   Subjective:   Chief Complaint: Follow-up of GERD, hiatal hernia and stricture  HPI The patient is here with her husband for follow-up of GERD with stricture, having undergone an EGD with balloon dilation to 20 mm of a stricture GE junction on 07/19/2021.  Prior to that she had her pantoprazole increased from 20 to 40 mg daily.  She has a large hiatal hernia and she also has chronic fundic gland polyps.  She reports that she is significantly improved and has a good quality of life with respect to reflux and dysphagia at this time.    She does have questions about the safety of PPI use and how it might lead to dementia as it has been reported in some studies.  Also questions about kidney failure and osteoporosis.  We reviewed these.  Diet history taken today with respect to sugary beverages and she really does  not drink those fortunately.  She has been educated on reflux triggers in the past as well.  We also reviewed hiatal hernia surgery she has a friend or an acquaintance that has had this done and is having a redo.  She also had a surveillance colonoscopy due to a history of polyps that was negative for recurrent neoplasia in August as well.  Anticipate a repeat in 2027. No Known Allergies Current Meds  Medication Sig   atorvastatin (LIPITOR) 10 MG tablet Take 1 tablet (10 mg total) by mouth every evening.   calcium carbonate (TUMS EX) 750 MG chewable tablet Chew 2 tablets by mouth 2 (two) times daily.   Cholecalciferol (VITAMIN D3 PO) Take 25 mg by mouth.   COLLAGEN PO Take 1 tablet by mouth daily.   levothyroxine (SYNTHROID) 25 MCG tablet Take 1 tablet (25 mcg total) by mouth daily.   Multiple Vitamin (MULTIVITAMIN) tablet Take 1 tablet by mouth daily.   Omega-3 Fatty Acids (FISH OIL) 1200 MG CAPS Take 1 capsule by mouth daily.   pantoprazole (PROTONIX) 40 MG tablet Take 1 tablet (40 mg total) by mouth daily before breakfast.   Past Medical History:  Diagnosis Date   COVID-19 03/2020   endometrial ca 02/12/2013   endometrial, positive nodes   External hemorrhoids    GERD (gastroesophageal reflux disease)    History of radiation therapy 05/27/2013-07/01/2013   pelvis 45 gray in 25 fractions, 63 gray with high  dose brachytherapy   Hx of adenomatous polyp of colon 06/13/2018   Hyperlipidemia    borderline high cholesterol   Hyperplastic colon polyp    Internal hemorrhoids    Migraine    Skin lesion    non maligant skin lesions on head   Past Surgical History:  Procedure Laterality Date   ABDOMINAL HYSTERECTOMY  02/12/13   COLONOSCOPY W/ BIOPSIES  06/15/2009   Dr Watt Climes - distal hyperplastic polyps   RHINOPLASTY     breathing problems- Dr Ernesto Rutherford   ROBOTIC ASSISTED TOTAL HYSTERECTOMY WITH BILATERAL SALPINGO OOPHERECTOMY N/A 02/12/2013   Procedure: ROBOTIC ASSISTED TOTAL HYSTERECTOMY  WITH BILATERAL SALPINGO OOPHORECTOMY,  LYMPH NODES;  Surgeon: Imagene Gurney A. 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   Review of Systems As above  Objective:   Physical Exam BP 132/79   Pulse 79   Ht 5\' 4"  (1.626 m)   Wt 193 lb 6 oz (87.7 kg)   BMI 33.19 kg/m   34 minutes total time on the visit

## 2021-11-23 ENCOUNTER — Other Ambulatory Visit: Payer: Self-pay | Admitting: Family Medicine

## 2021-11-23 ENCOUNTER — Other Ambulatory Visit (HOSPITAL_COMMUNITY)
Admission: RE | Admit: 2021-11-23 | Discharge: 2021-11-23 | Disposition: A | Discharge status: Home / Self Care | Payer: 59 | Source: Ambulatory Visit | Attending: Obstetrics & Gynecology | Admitting: Obstetrics & Gynecology

## 2021-11-23 ENCOUNTER — Other Ambulatory Visit: Payer: Self-pay

## 2021-11-23 ENCOUNTER — Ambulatory Visit (INDEPENDENT_AMBULATORY_CARE_PROVIDER_SITE_OTHER): Payer: 59 | Admitting: Obstetrics & Gynecology

## 2021-11-23 ENCOUNTER — Encounter: Payer: Self-pay | Admitting: Obstetrics & Gynecology

## 2021-11-23 VITALS — BP 114/78 | HR 83 | Ht 64.75 in | Wt 194.0 lb

## 2021-11-23 DIAGNOSIS — Z8542 Personal history of malignant neoplasm of other parts of uterus: Secondary | ICD-10-CM | POA: Insufficient documentation

## 2021-11-23 DIAGNOSIS — Z1272 Encounter for screening for malignant neoplasm of vagina: Secondary | ICD-10-CM

## 2021-11-23 DIAGNOSIS — Z780 Asymptomatic menopausal state: Secondary | ICD-10-CM

## 2021-11-23 DIAGNOSIS — Z6832 Body mass index (BMI) 32.0-32.9, adult: Secondary | ICD-10-CM

## 2021-11-23 DIAGNOSIS — E6609 Other obesity due to excess calories: Secondary | ICD-10-CM

## 2021-11-23 DIAGNOSIS — Z90710 Acquired absence of both cervix and uterus: Secondary | ICD-10-CM | POA: Diagnosis not present

## 2021-11-23 DIAGNOSIS — Z90722 Acquired absence of ovaries, bilateral: Secondary | ICD-10-CM

## 2021-11-23 DIAGNOSIS — Z01419 Encounter for gynecological examination (general) (routine) without abnormal findings: Secondary | ICD-10-CM | POA: Insufficient documentation

## 2021-11-23 DIAGNOSIS — Z9079 Acquired absence of other genital organ(s): Secondary | ICD-10-CM

## 2021-11-23 DIAGNOSIS — M85851 Other specified disorders of bone density and structure, right thigh: Secondary | ICD-10-CM

## 2021-11-23 DIAGNOSIS — Z1231 Encounter for screening mammogram for malignant neoplasm of breast: Secondary | ICD-10-CM

## 2021-11-23 NOTE — Progress Notes (Signed)
Jane Ruiz [birth year] [id number]   History:    60 y.o. G1P1L1  Married.  Daughter Jane Ruiz   RP:  Established patient presenting for annual gyn exam    HPI: Postmenopause, well on no HRT.  S/P Total Hysterectomy/BSO/Staging for Endometrial Ca stage 3 in 2014.  Adjuvant ChemoRx and RadioRx received.  No pelvic pain. Pap vagina Neg 11/2020. Pap reflex done today. Breasts normal.  Mammo Neg 08/2020, will schedule now.  Urine/BMs normal.  BMI 32.53.  Daily treadmill.  Healthy nutrition.  Health Labs with Fam MD.  BD 04/2021 very mild Osteopenia T-Score -1.1.  Colonoscopy 07/2021.     Past medical history,surgical history, family history and social history were all reviewed and documented in the EPIC chart.  Gynecologic History No LMP recorded. Patient has had a hysterectomy.  Obstetric History OB History  Gravida Para Term Preterm AB Living  1 1       1   SAB IAB Ectopic Multiple Live Births               # Outcome Date GA Lbr Len/2nd Weight Sex Delivery Anes PTL Lv  1 Para              ROS: A ROS was performed and pertinent positives and negatives are included in the history.  GENERAL: No fevers or chills. HEENT: No change in vision, no earache, sore throat or sinus congestion. NECK: No pain or stiffness. CARDIOVASCULAR: No chest pain or pressure. No palpitations. PULMONARY: No shortness of breath, cough or wheeze. GASTROINTESTINAL: No abdominal pain, nausea, vomiting or diarrhea, melena or bright red blood per rectum. GENITOURINARY: No urinary frequency, urgency, hesitancy or dysuria. MUSCULOSKELETAL: No joint or muscle pain, no back pain, no recent trauma. DERMATOLOGIC: No rash, no itching, no lesions. ENDOCRINE: No polyuria, polydipsia, no heat or cold intolerance. No recent change in weight. HEMATOLOGICAL: No anemia or easy bruising or bleeding. NEUROLOGIC: No headache, seizures, numbness, tingling or weakness. PSYCHIATRIC: No depression, no loss of interest in normal activity or  change in sleep pattern.     Exam:   BP 114/78    Pulse 83    Ht 5' 4.75" (1.645 m)    Wt 194 lb (88 kg)    SpO2 97%    BMI 32.53 kg/m   Body mass index is 32.53 kg/m.  General appearance : Well developed well nourished female. No acute distress HEENT: Eyes: no retinal hemorrhage or exudates,  Neck supple, trachea midline, no carotid bruits, no thyroidmegaly Lungs: Clear to auscultation, no rhonchi or wheezes, or rib retractions  Heart: Regular rate and rhythm, no murmurs or gallops Breast:Examined in sitting and supine position were symmetrical in appearance, no palpable masses or tenderness,  no skin retraction, no nipple inversion, no nipple discharge, no skin discoloration, no axillary or supraclavicular lymphadenopathy Abdomen: no palpable masses or tenderness, no rebound or guarding Extremities: no edema or skin discoloration or tenderness  Pelvic: Vulva: Normal             Vagina: No gross lesions or discharge.  Pap reflex done.  Cervix/Uterus absent  Adnexa  Without masses or tenderness  Anus: Normal   Assessment/Plan:  60 y.o. female for annual exam   1. Encounter for Papanicolaou smear of vagina as part of routine gynecological examination Postmenopause, well on no HRT.  S/P Total Hysterectomy/BSO/Staging for Endometrial Ca stage 3 in 2014.  Adjuvant ChemoRx and RadioRx received.  No pelvic pain. Pap vagina Neg 11/2020. Pap  reflex done today. Breasts normal.  Mammo Neg 08/2020, will schedule now.  Urine/BMs normal.  BMI 32.53.  Daily treadmill.  Healthy nutrition.  Health Labs with Fam MD.  BD 04/2021 very mild Osteopenia T-Score -1.1.  Colonoscopy 07/2021.    2. History of endometrial cancer S/P Total Hysterectomy/BSO/Staging for Endometrial Ca stage 3 in 2014.  Adjuvant ChemoRx and RadioRx received.  No pelvic pain. Pap vagina Neg 11/2020. Pap reflex done today.  3. S/P total hysterectomy and BSO (bilateral salpingo-oophorectomy)  4. Postmenopause Postmenopause, well on  no HRT.   5. Osteopenia of neck of right femur Very mild Osteopenia with T-Score -1.1 at Rt Fem Neck in 04/2021.  Repeat BD at 3-5 yrs.  Vit D, Ca++ total 1.2-1.5 g/d, regular wt bearing physical activity to continue.  6. Class 1 obesity due to excess calories with serious comorbidity and body mass index (BMI) of 32.0 to 32.9 in adult Low calorie/carb diet.  Continue fitness activities.  Other orders - Ascorbic Acid (VITAMIN C PO); Take by mouth.   Princess Bruins MD, 10:38 AM 11/23/2021

## 2021-11-25 ENCOUNTER — Ambulatory Visit
Admission: RE | Admit: 2021-11-25 | Discharge: 2021-11-25 | Disposition: A | Discharge status: Home / Self Care | Payer: 59 | Source: Ambulatory Visit | Attending: Family Medicine | Admitting: Family Medicine

## 2021-11-25 DIAGNOSIS — Z1231 Encounter for screening mammogram for malignant neoplasm of breast: Secondary | ICD-10-CM

## 2021-11-26 LAB — CYTOLOGY - PAP: Diagnosis: NEGATIVE

## 2022-02-15 DIAGNOSIS — D044 Carcinoma in situ of skin of scalp and neck: Secondary | ICD-10-CM | POA: Diagnosis not present

## 2022-02-15 DIAGNOSIS — L570 Actinic keratosis: Secondary | ICD-10-CM | POA: Diagnosis not present

## 2022-03-09 DIAGNOSIS — D044 Carcinoma in situ of skin of scalp and neck: Secondary | ICD-10-CM | POA: Diagnosis not present

## 2022-04-12 ENCOUNTER — Other Ambulatory Visit: Payer: Self-pay | Admitting: Family Medicine

## 2022-04-12 DIAGNOSIS — Z1231 Encounter for screening mammogram for malignant neoplasm of breast: Secondary | ICD-10-CM

## 2022-04-19 DIAGNOSIS — L603 Nail dystrophy: Secondary | ICD-10-CM | POA: Diagnosis not present

## 2022-04-19 DIAGNOSIS — D225 Melanocytic nevi of trunk: Secondary | ICD-10-CM | POA: Diagnosis not present

## 2022-04-19 DIAGNOSIS — D2271 Melanocytic nevi of right lower limb, including hip: Secondary | ICD-10-CM | POA: Diagnosis not present

## 2022-04-19 DIAGNOSIS — L821 Other seborrheic keratosis: Secondary | ICD-10-CM | POA: Diagnosis not present

## 2022-04-19 DIAGNOSIS — Z85828 Personal history of other malignant neoplasm of skin: Secondary | ICD-10-CM | POA: Diagnosis not present

## 2022-04-24 ENCOUNTER — Other Ambulatory Visit: Payer: Self-pay | Admitting: Internal Medicine

## 2022-04-25 ENCOUNTER — Other Ambulatory Visit (HOSPITAL_COMMUNITY): Payer: Self-pay

## 2022-04-25 ENCOUNTER — Telehealth: Payer: Self-pay | Admitting: Internal Medicine

## 2022-04-25 ENCOUNTER — Telehealth: Payer: Self-pay | Admitting: Pharmacy Technician

## 2022-04-25 NOTE — Telephone Encounter (Signed)
I spoke to [place] and I let her know that our team is working on this.

## 2022-04-25 NOTE — Telephone Encounter (Signed)
Patient called regarding prescription refill for Protonix 40 mg. Patient states insurance needs doctor to call [phone] for prior authorization. Patient states insurances needs to make sure that it is medically neccessary for patient to take medication. Please give a call back to advise.  Thank you.

## 2022-04-25 NOTE — Telephone Encounter (Signed)
Patient aware that our prior authorization team is working on this.

## 2022-04-25 NOTE — Telephone Encounter (Signed)
I have routed this message to our new prior authorization team.

## 2022-04-25 NOTE — Telephone Encounter (Signed)
PA has been submitted.

## 2022-04-25 NOTE — Telephone Encounter (Signed)
Patient Advocate Encounter  Received notification from PHARMACY that prior authorization for PANTOPRAZOLE '40MG'$  is required.   PA submitted on 5.22.23 Key [id number] Status is pending   [facility] will continue to follow  Luciano Cutter, CPhT Patient Advocate Phone: [phone]

## 2022-04-26 ENCOUNTER — Other Ambulatory Visit (HOSPITAL_COMMUNITY): Payer: Self-pay

## 2022-04-26 NOTE — Telephone Encounter (Signed)
PA has been APPROVED. Pharmacy has been notified. Insurance will only cover 30 day supply at a time with a $0 copay.

## 2022-04-26 NOTE — Telephone Encounter (Signed)
Received notification from [facility] regarding a prior authorization for PANTOPRAZOLE '40MG'$ . Authorization has been APPROVED from 5.22.23 to 5.22.24.   Per test claim, copay for 30 days supply is $0   Authorization # PA# [id number] PM

## 2022-05-03 ENCOUNTER — Telehealth: Payer: Self-pay

## 2022-05-03 MED ORDER — LEVOTHYROXINE SODIUM 25 MCG PO TABS: 25.0000 ug | ORAL_TABLET | Freq: Every day | ORAL | 0 refills | Status: DC

## 2022-05-03 NOTE — Telephone Encounter (Signed)
[place] Night - Client Nonclinical Telephone Record  [facility] Client [facility] Night - Client Client Site Coin Provider Loura Pardon - MD Contact Type Call Who Is Calling Patient / Member / Family / Caregiver Caller Name [place] Phone Number [phone] Patient Name Jane Ruiz Patient DOB Dec 21, 1960 Call Type Message Only Information Provided Reason for Call Medication Question / Request Initial Comment Caller states, needs refill on her medication. Only has 10 pills left. Additional Comment Called declined triage. Disp. Time Disposition Final User 05/03/2022 7:59:31 AM General Information Provided Yes Jobie Quaker Call Closed By: Jobie Quaker Transaction Date/Time: 05/03/2022 7:56:27 AM (ET

## 2022-05-03 NOTE — Telephone Encounter (Signed)
I spoke with pt and she does not have enough Levothyroxine 25 mcg to last until her CPX scheduled on 05/20/2022.last annual was 05/19/2021. Pt taking Levothyroxine 25 mcg one daily by mouth. Pt said her ins only allows # 30. Pt notified will send Levothyroxine 25 mcg # 30 to [place]. Pt voiced understanding and appreciative.

## 2022-05-06 ENCOUNTER — Other Ambulatory Visit: Payer: Self-pay | Admitting: Family Medicine

## 2022-05-13 ENCOUNTER — Other Ambulatory Visit (INDEPENDENT_AMBULATORY_CARE_PROVIDER_SITE_OTHER): Payer: 59

## 2022-05-13 ENCOUNTER — Telehealth (INDEPENDENT_AMBULATORY_CARE_PROVIDER_SITE_OTHER): Payer: 59 | Admitting: Family Medicine

## 2022-05-13 DIAGNOSIS — E039 Hypothyroidism, unspecified: Secondary | ICD-10-CM | POA: Diagnosis not present

## 2022-05-13 DIAGNOSIS — E7800 Pure hypercholesterolemia, unspecified: Secondary | ICD-10-CM

## 2022-05-13 DIAGNOSIS — Z0000 Encounter for general adult medical examination without abnormal findings: Secondary | ICD-10-CM | POA: Diagnosis not present

## 2022-05-13 LAB — COMPREHENSIVE METABOLIC PANEL
ALT: 12 U/L (ref 0–35)
AST: 15 U/L (ref 0–37)
Albumin: 4.2 g/dL (ref 3.5–5.2)
Alkaline Phosphatase: 87 U/L (ref 39–117)
BUN: 16 mg/dL (ref 6–23)
CO2: 26 mEq/L (ref 19–32)
Calcium: 9.7 mg/dL (ref 8.4–10.5)
Chloride: 105 mEq/L (ref 96–112)
Creatinine, Ser: 0.74 mg/dL (ref 0.40–1.20)
GFR: 86.11 mL/min (ref >60.00)
Glucose, Bld: 88 mg/dL (ref 70–99)
Potassium: 4.2 mEq/L (ref 3.5–5.1)
Sodium: 140 mEq/L (ref 135–145)
Total Bilirubin: 0.4 mg/dL (ref 0.2–1.2)
Total Protein: 6.8 g/dL (ref 6.0–8.3)

## 2022-05-13 LAB — CBC WITH DIFFERENTIAL/PLATELET
Basophils Absolute: 0 10*3/uL (ref 0.0–0.1)
Basophils Relative: 0.4 % (ref 0.0–3.0)
Eosinophils Absolute: 0.2 10*3/uL (ref 0.0–0.7)
Eosinophils Relative: 2.8 % (ref 0.0–5.0)
HCT: 39.3 % (ref 36.0–46.0)
Hemoglobin: 12.7 g/dL (ref 12.0–15.0)
Lymphocytes Relative: 20.4 % (ref 12.0–46.0)
Lymphs Abs: 1.7 10*3/uL (ref 0.7–4.0)
MCHC: 32.4 g/dL (ref 30.0–36.0)
MCV: 84 fl (ref 78.0–100.0)
Monocytes Absolute: 0.7 10*3/uL (ref 0.1–1.0)
Monocytes Relative: 8.1 % (ref 3.0–12.0)
Neutro Abs: 5.6 10*3/uL (ref 1.4–7.7)
Neutrophils Relative %: 68.3 % (ref 43.0–77.0)
Platelets: 322 10*3/uL (ref 150.0–400.0)
RBC: 4.67 Mil/uL (ref 3.87–5.11)
RDW: 15.1 % (ref 11.5–15.5)
WBC: 8.2 10*3/uL (ref 4.0–10.5)

## 2022-05-13 LAB — LIPID PANEL
Cholesterol: 169 mg/dL (ref 0–200)
HDL: 37.3 mg/dL — ABNORMAL LOW (ref >39.00)
LDL Cholesterol: 97 mg/dL (ref 0–99)
NonHDL: 131.6
Total CHOL/HDL Ratio: 5
Triglycerides: 175 mg/dL — ABNORMAL HIGH (ref 0.0–149.0)
VLDL: 35 mg/dL (ref 0.0–40.0)

## 2022-05-13 LAB — TSH: TSH: 4.8 u[IU]/mL (ref 0.35–5.50)

## 2022-05-13 NOTE — Telephone Encounter (Signed)
Lab for cpe

## 2022-05-20 ENCOUNTER — Encounter: Payer: 59 | Admitting: Family Medicine

## 2022-05-23 ENCOUNTER — Encounter: Payer: Self-pay | Admitting: Family Medicine

## 2022-05-23 ENCOUNTER — Ambulatory Visit (INDEPENDENT_AMBULATORY_CARE_PROVIDER_SITE_OTHER): Payer: 59 | Admitting: Family Medicine

## 2022-05-23 VITALS — BP 142/80 | HR 76 | Ht 64.0 in | Wt 194.0 lb

## 2022-05-23 DIAGNOSIS — R030 Elevated blood-pressure reading, without diagnosis of hypertension: Secondary | ICD-10-CM | POA: Insufficient documentation

## 2022-05-23 DIAGNOSIS — E6609 Other obesity due to excess calories: Secondary | ICD-10-CM

## 2022-05-23 DIAGNOSIS — Z0000 Encounter for general adult medical examination without abnormal findings: Secondary | ICD-10-CM

## 2022-05-23 DIAGNOSIS — E7800 Pure hypercholesterolemia, unspecified: Secondary | ICD-10-CM

## 2022-05-23 DIAGNOSIS — K222 Esophageal obstruction: Secondary | ICD-10-CM | POA: Diagnosis not present

## 2022-05-23 DIAGNOSIS — K219 Gastro-esophageal reflux disease without esophagitis: Secondary | ICD-10-CM

## 2022-05-23 DIAGNOSIS — Z6833 Body mass index (BMI) 33.0-33.9, adult: Secondary | ICD-10-CM | POA: Diagnosis not present

## 2022-05-23 DIAGNOSIS — E039 Hypothyroidism, unspecified: Secondary | ICD-10-CM

## 2022-05-23 MED ORDER — ATORVASTATIN CALCIUM 10 MG PO TABS: 10.0000 mg | ORAL_TABLET | Freq: Every evening | ORAL | 11 refills | Status: DC

## 2022-05-23 MED ORDER — PANTOPRAZOLE SODIUM 40 MG PO TBEC: 40.0000 mg | DELAYED_RELEASE_TABLET | Freq: Every day | ORAL | 11 refills | Status: DC

## 2022-05-23 MED ORDER — LEVOTHYROXINE SODIUM 25 MCG PO TABS
25.0000 ug | ORAL_TABLET | Freq: Every day | ORAL | 11 refills | Status: DC
Start: 2022-05-23 — End: 2023-05-15

## 2022-05-23 NOTE — Assessment & Plan Note (Signed)
Discussed how this problem influences overall health and the risks it imposes  Reviewed plan for weight loss with lower calorie diet (via better food choices and also portion control or program like weight watchers) and exercise building up to or more than 30 minutes 5 days per week including some aerobic activity    

## 2022-05-23 NOTE — Assessment & Plan Note (Signed)
Disc goals for lipids and reasons to control them Rev last labs with pt Rev low sat fat diet in detail  Atorvastatin 10 mg daily  Will work on sugar intake for trig  Continue exercise for HDL

## 2022-05-23 NOTE — Assessment & Plan Note (Signed)
Reviewed health habits including diet and exercise and skin cancer prevention Reviewed appropriate screening tests for age  Also reviewed health mt list, fam hx and immunization status , as well as social and family history   See HPI Labs reviewed  Enc to continue exercise  Pap utd Mammogram utd with gyn care  Taking D3 and mvi for bone health  Colonoscopy utd 2022 with 5 y recall bp up today-will return to re check after lifestyle change

## 2022-05-23 NOTE — Progress Notes (Signed)
Subjective:    Patient ID: Jane Ruiz, female    DOB: 08/14/1961, 61 y.o.   MRN: [id number]  HPI Here for health maintenance exam and to review chronic medical problems    Wt Readings from Last 3 Encounters:  05/23/22 194 lb (88 kg)  11/23/21 194 lb (88 kg)  09/20/21 193 lb 6 oz (87.7 kg)   33.30 kg/m  Walking daily on her treadmill  30 minutes at 3.0 is 1 1/2 miles   Diet - fair/not perfect   Bagel/honey- stopped that and now cereal  Banana sandwich  Salad with chicken    Immunization History  Administered Date(s) Administered   Influenza Split 11/04/2011, 09/05/2016   Influenza, Seasonal, Injecte, Preservative Fre 10/01/2014, 09/12/2015   Influenza,inj,Quad PF,6+ Mos 09/16/2013, 09/23/2017, 09/17/2018, 08/16/2019   Td 11/12/2008   Tdap 03/19/2018   Zoster Recombinat (Shingrix) 05/16/2019, 08/06/2019   Mammogram 11/2021 Self breast exam: no lumps   Pap 11/2021 neg Gyn, Dr Dellis Filbert Hysterectomy in the past for endometrial cancer  Goes yearly in dec   Bone health : takes vitamin D , multi vitamin  Occ tums   Colonoscopy 07/2021 with 5 y recall   BP Readings from Last 3 Encounters:  05/23/22 (!) 158/88  11/23/21 114/78  09/20/21 132/79   Pulse Readings from Last 3 Encounters:  05/23/22 76  11/23/21 83  09/20/21 79    Going to dermatology for skin lesions  Some skin cancer and pre cancer (on scalp)  Has f/u next month  She stays out of sun  Wearing a hat    GERD- protonix 40 mg daily  Under care of GI  Hypothyroidism  Pt has no clinical changes No change in energy level/ hair or skin/ edema and no tremor Lab Results  Component Value Date   TSH 4.80 05/13/2022    Levothyroxine 25 mcg daily    Hyperlipidemia Lab Results  Component Value Date   CHOL 169 05/13/2022   CHOL 170 05/12/2021   CHOL 164 05/08/2020   Lab Results  Component Value Date   HDL 37.30 (L) 05/13/2022   HDL 37.80 (L) 05/12/2021   HDL 31.60 (L) 05/08/2020   Lab  Results  Component Value Date   LDLCALC 97 05/13/2022   LDLCALC 99 05/12/2021   LDLCALC 99 05/08/2020   Lab Results  Component Value Date   TRIG 175.0 (H) 05/13/2022   TRIG 162.0 (H) 05/12/2021   TRIG 166.0 (H) 05/08/2020   Lab Results  Component Value Date   CHOLHDL 5 05/13/2022   CHOLHDL 4 05/12/2021   CHOLHDL 5 05/08/2020   Lab Results  Component Value Date   LDLDIRECT 172.4 12/24/2010   LDLDIRECT 174.9 09/17/2010   LDLDIRECT 164.2 07/06/2010   Atorvastatin 10 mg daily  LDL stable Trig up - more carb/sugar    Other labs Lab Results  Component Value Date   CREATININE 0.74 05/13/2022   BUN 16 05/13/2022   NA 140 05/13/2022   K 4.2 05/13/2022   CL 105 05/13/2022   CO2 26 05/13/2022   Lab Results  Component Value Date   ALT 12 05/13/2022   AST 15 05/13/2022   ALKPHOS 87 05/13/2022   BILITOT 0.4 05/13/2022    Lab Results  Component Value Date   WBC 8.2 05/13/2022   HGB 12.7 05/13/2022   HCT 39.3 05/13/2022   MCV 84.0 05/13/2022   PLT 322.0 05/13/2022   Glucose 88  Patient Active Problem List   Diagnosis Date Noted  Elevated blood pressure reading 05/23/2022   Hiatal hernia-10 cm-large 09/20/2021   GERD with stricture 05/19/2021   Bleeding internal hemorrhoids 06/13/2018   Hx of adenomatous polyp of colon 06/13/2018   Hypothyroid 01/04/2016   History of endometrial cancer 02/12/2013   Other screening mammogram 11/04/2011   Obesity 11/04/2011   Routine general medical examination at a health care facility 10/20/2011   PLANTAR FASCIITIS 12/31/2010   Hyperlipidemia 09/24/2010   MENOPAUSAL SYNDROME 07/06/2010   Past Medical History:  Diagnosis Date   COVID-19 03/2020   endometrial ca 02/12/2013   endometrial, positive nodes   External hemorrhoids    GERD (gastroesophageal reflux disease)    Hernia, hiatal    History of radiation therapy 05/27/2013-07/01/2013   pelvis 45 gray in 25 fractions, 63 gray with high dose brachytherapy   Hx of  adenomatous polyp of colon 06/13/2018   Hyperlipidemia    borderline high cholesterol   Hyperplastic colon polyp    Internal hemorrhoids    Migraine    Skin lesion    non maligant skin lesions on head   Past Surgical History:  Procedure Laterality Date   ABDOMINAL HYSTERECTOMY  02/12/13   COLONOSCOPY W/ BIOPSIES  06/15/2009   Dr Watt Climes - distal hyperplastic polyps   RHINOPLASTY     breathing problems- Dr Ernesto Rutherford   ROBOTIC ASSISTED TOTAL HYSTERECTOMY WITH BILATERAL SALPINGO OOPHERECTOMY N/A 02/12/2013   Procedure: ROBOTIC ASSISTED TOTAL HYSTERECTOMY WITH BILATERAL SALPINGO OOPHORECTOMY,  LYMPH NODES;  Surgeon: Imagene Gurney A. Alycia Rossetti, MD;  Location: [facility];  Service: Gynecology;  Laterality: N/A;   Social History   Tobacco Use   Smoking status: Never   Smokeless tobacco: Never  Vaping Use   Vaping Use: Never used  Substance Use Topics   Alcohol use: No    Alcohol/week: 0.0 standard drinks of alcohol   Drug use: No   Family History  Problem Relation Age of Onset   Diabetes Mother    Kidney disease Mother    Benign prostatic hyperplasia Father    Diabetes Maternal Grandmother    Colon cancer Neg Hx    Esophageal cancer Neg Hx    Stomach cancer Neg Hx    No Known Allergies Current Outpatient Medications on File Prior to Visit  Medication Sig Dispense Refill   calcium carbonate (TUMS EX) 750 MG chewable tablet Chew 2 tablets by mouth 2 (two) times daily.     COLLAGEN PO Take 1 tablet by mouth daily.     Multiple Vitamin (MULTIVITAMIN) tablet Take 1 tablet by mouth daily.     Omega-3 Fatty Acids (FISH OIL) 1200 MG CAPS Take 1 capsule by mouth daily.     Biotin 2500 MCG CAPS      No current facility-administered medications on file prior to visit.    Review of Systems  Constitutional:  Negative for activity change, appetite change, fatigue, fever and unexpected weight change.  HENT:  Negative for congestion, ear pain, rhinorrhea, sinus pressure and sore throat.   Eyes:  Negative  for pain, redness and visual disturbance.  Respiratory:  Negative for cough, shortness of breath and wheezing.   Cardiovascular:  Negative for chest pain and palpitations.  Gastrointestinal:  Negative for abdominal pain, blood in stool, constipation and diarrhea.  Endocrine: Negative for polydipsia and polyuria.  Genitourinary:  Negative for dysuria, frequency and urgency.  Musculoskeletal:  Negative for arthralgias, back pain and myalgias.  Skin:  Negative for pallor and rash.  Allergic/Immunologic: Negative for environmental allergies.  Neurological:  Negative for dizziness, syncope and headaches.  Hematological:  Negative for adenopathy. Does not bruise/bleed easily.  Psychiatric/Behavioral:  Negative for decreased concentration and dysphoric mood. The patient is not nervous/anxious.        Objective:   Physical Exam Constitutional:      General: She is not in acute distress.    Appearance: Normal appearance. She is well-developed. She is obese. She is not ill-appearing or diaphoretic.  HENT:     Head: Normocephalic and atraumatic.     Right Ear: Tympanic membrane, ear canal and external ear normal.     Left Ear: Tympanic membrane, ear canal and external ear normal.     Nose: Nose normal. No congestion.     Mouth/Throat:     Mouth: Mucous membranes are moist.     Pharynx: Oropharynx is clear. No posterior oropharyngeal erythema.  Eyes:     General: No scleral icterus.    Extraocular Movements: Extraocular movements intact.     Conjunctiva/sclera: Conjunctivae normal.     Pupils: Pupils are equal, round, and reactive to light.  Neck:     Thyroid: No thyromegaly.     Vascular: No carotid bruit or JVD.  Cardiovascular:     Rate and Rhythm: Normal rate and regular rhythm.     Pulses: Normal pulses.     Heart sounds: Normal heart sounds.     No gallop.  Pulmonary:     Effort: Pulmonary effort is normal. No respiratory distress.     Breath sounds: Normal breath sounds. No  wheezing.     Comments: Good air exch Chest:     Chest wall: No tenderness.  Abdominal:     General: Bowel sounds are normal. There is no distension or abdominal bruit.     Palpations: Abdomen is soft. There is no mass.     Tenderness: There is no abdominal tenderness.     Hernia: No hernia is present.  Genitourinary:    Comments: Breast and pelvic exam done by gyn provider Musculoskeletal:        General: No tenderness. Normal range of motion.     Cervical back: Normal range of motion and neck supple. No rigidity. No muscular tenderness.     Right lower leg: No edema.     Left lower leg: No edema.     Comments: No kyphosis   Lymphadenopathy:     Cervical: No cervical adenopathy.  Skin:    General: Skin is warm and dry.     Coloration: Skin is not pale.     Findings: No erythema or rash.     Comments: Solar lentigines diffusely fair  Neurological:     Mental Status: She is alert. Mental status is at baseline.     Cranial Nerves: No cranial nerve deficit.     Motor: No abnormal muscle tone.     Coordination: Coordination normal.     Gait: Gait normal.     Deep Tendon Reflexes: Reflexes are normal and symmetric. Reflexes normal.  Psychiatric:        Mood and Affect: Mood normal.        Cognition and Memory: Cognition and memory normal.           Assessment & Plan:   Problem List Items Addressed This Visit       Digestive   GERD with stricture    Taking protonix -lifelong Px handed over from GI  Has decided against surgery for HH at this time  Relevant Medications   pantoprazole (PROTONIX) 40 MG tablet     Endocrine   Hypothyroid    Hypothyroidism  Pt has no clinical changes No change in energy level/ hair or skin/ edema and no tremor Lab Results  Component Value Date   TSH 4.80 05/13/2022    Plan to continue levothyroxine 25 mcg daily        Relevant Medications   levothyroxine (SYNTHROID) 25 MCG tablet     Other   Elevated blood pressure  reading    Today BP: (!) 142/80    Pt states this may be from anxiety/nervous today  Given DASH eating guidelines F/u 2-3 mo for re check       Hyperlipidemia    Disc goals for lipids and reasons to control them Rev last labs with pt Rev low sat fat diet in detail  Atorvastatin 10 mg daily  Will work on sugar intake for trig  Continue exercise for HDL      Relevant Medications   atorvastatin (LIPITOR) 10 MG tablet   Obesity    Discussed how this problem influences overall health and the risks it imposes  Reviewed plan for weight loss with lower calorie diet (via better food choices and also portion control or program like weight watchers) and exercise building up to or more than 30 minutes 5 days per week including some aerobic activity         Routine general medical examination at a health care facility - Primary    Reviewed health habits including diet and exercise and skin cancer prevention Reviewed appropriate screening tests for age  Also reviewed health mt list, fam hx and immunization status , as well as social and family history   See HPI Labs reviewed  Enc to continue exercise  Pap utd Mammogram utd with gyn care  Taking D3 and mvi for bone health  Colonoscopy utd 2022 with 5 y recall bp up today-will return to re check after lifestyle change

## 2022-05-23 NOTE — Assessment & Plan Note (Signed)
Taking protonix -lifelong Px handed over from GI  Has decided against surgery for [facility] at this time

## 2022-05-23 NOTE — Assessment & Plan Note (Signed)
Hypothyroidism  Pt has no clinical changes No change in energy level/ hair or skin/ edema and no tremor Lab Results  Component Value Date   TSH 4.80 05/13/2022    Plan to continue levothyroxine 25 mcg daily

## 2022-05-23 NOTE — Assessment & Plan Note (Signed)
Today BP: (!) 142/80    Pt states this may be from anxiety/nervous today  Given DASH eating guidelines F/u 2-3 mo for re check

## 2022-05-23 NOTE — Patient Instructions (Addendum)
For breakfast -eat protein with a piece of fruit is a good idea   Nuts and nut butters are good     Try to get most of your carbohydrates from produce (with the exception of white potatoes)  Eat less bread/pasta/rice/snack foods/cereals/sweets and other items from the middle of the grocery store (processed carbs)  Avoid added sugar when you can   Try to get 1200-1500 mg of calcium per day with at least 2000 iu of vitamin D - for bone health If calcium gives you constipation then just take the D   I want to watch your blood pressure  Exercise and avoid processed foods when you can   Follow up in 2-3 months so we can re check that

## 2022-06-17 DIAGNOSIS — L821 Other seborrheic keratosis: Secondary | ICD-10-CM | POA: Diagnosis not present

## 2022-06-17 DIAGNOSIS — L570 Actinic keratosis: Secondary | ICD-10-CM | POA: Diagnosis not present

## 2022-08-23 ENCOUNTER — Ambulatory Visit: Payer: 59 | Admitting: Family Medicine

## 2022-08-24 ENCOUNTER — Ambulatory Visit (INDEPENDENT_AMBULATORY_CARE_PROVIDER_SITE_OTHER): Payer: 59 | Admitting: Family Medicine

## 2022-08-24 ENCOUNTER — Encounter: Payer: Self-pay | Admitting: Family Medicine

## 2022-08-24 VITALS — BP 125/80 | HR 71 | Temp 97.3°F | Ht 64.0 in | Wt 189.1 lb

## 2022-08-24 DIAGNOSIS — R030 Elevated blood-pressure reading, without diagnosis of hypertension: Secondary | ICD-10-CM | POA: Diagnosis not present

## 2022-08-24 DIAGNOSIS — Z23 Encounter for immunization: Secondary | ICD-10-CM

## 2022-08-24 NOTE — Patient Instructions (Signed)
Blood pressure is better    Keep working on healthy diet and exercise   Keep walking Consider adding some resistance training also   Avoid the processed carbs and sweets and the hight salt stuff Try to get most of your carbohydrates from produce (with the exception of white potatoes)  Eat less bread/pasta/rice/snack foods/cereals/sweets and other items from the middle of the grocery store (processed carbs)

## 2022-08-24 NOTE — Assessment & Plan Note (Signed)
Improved bp with better lifestyle and 5 lb wt loss  BP: 125/80  Enc pt to keep it up

## 2022-08-24 NOTE — Progress Notes (Signed)
Subjective:    Patient ID: Jane Ruiz, female    DOB: [birth year], 61 y.o.   MRN: [id number]  HPI Pt presents for f/u of elevated bp  Wt Readings from Last 3 Encounters:  08/24/22 189 lb 2 oz (85.8 kg)  05/23/22 194 lb (88 kg)  11/23/21 194 lb (88 kg)   32.46 kg/m  Wt is down 5 lb   Trying to stay away from sugar  Getting exercise    1.5 mi walking per day    Last visit bp was mildly elevated  Given DASH eating guidelines and discussed exercise   BP Readings from Last 3 Encounters:  08/24/22 125/80  05/23/22 (!) 142/80  11/23/21 114/78    Pulse Readings from Last 3 Encounters:  08/24/22 71  05/23/22 76  11/23/21 83    Checking at home - running good  115/74 last check   Avoiding excess sodium  Quit lunch meat Less processed foods   Family history-no HTN   Lab Results  Component Value Date   CREATININE 0.74 05/13/2022   BUN 16 05/13/2022   NA 140 05/13/2022   K 4.2 05/13/2022   CL 105 05/13/2022   CO2 26 05/13/2022   GFR 86.11  Lab Results  Component Value Date   CHOL 169 05/13/2022   HDL 37.30 (L) 05/13/2022   LDLCALC 97 05/13/2022   LDLDIRECT 172.4 12/24/2010   TRIG 175.0 (H) 05/13/2022   CHOLHDL 5 05/13/2022    Patient Active Problem List   Diagnosis Date Noted   Elevated blood pressure reading 05/23/2022   Hiatal hernia-10 cm-large 09/20/2021   GERD with stricture 05/19/2021   Bleeding internal hemorrhoids 06/13/2018   Hx of adenomatous polyp of colon 06/13/2018   Hypothyroid 01/04/2016   History of endometrial cancer 02/12/2013   Other screening mammogram 11/04/2011   Obesity 11/04/2011   Routine general medical examination at a health care facility 10/20/2011   PLANTAR FASCIITIS 12/31/2010   Hyperlipidemia 09/24/2010   MENOPAUSAL SYNDROME 07/06/2010   Past Medical History:  Diagnosis Date   COVID-19 03/2020   endometrial ca 02/12/2013   endometrial, positive nodes   External hemorrhoids    GERD (gastroesophageal reflux  disease)    Hernia, hiatal    History of radiation therapy 05/27/2013-07/01/2013   pelvis 45 gray in 25 fractions, 63 gray with high dose brachytherapy   Hx of adenomatous polyp of colon 06/13/2018   Hyperlipidemia    borderline high cholesterol   Hyperplastic colon polyp    Internal hemorrhoids    Migraine    Skin lesion    non maligant skin lesions on head   Past Surgical History:  Procedure Laterality Date   ABDOMINAL HYSTERECTOMY  02/12/13   COLONOSCOPY W/ BIOPSIES  06/15/2009   Dr Watt Climes - distal hyperplastic polyps   RHINOPLASTY     breathing problems- Dr Ernesto Rutherford   ROBOTIC ASSISTED TOTAL HYSTERECTOMY WITH BILATERAL SALPINGO OOPHERECTOMY N/A 02/12/2013   Procedure: ROBOTIC ASSISTED TOTAL HYSTERECTOMY WITH BILATERAL SALPINGO OOPHORECTOMY,  LYMPH NODES;  Surgeon: Imagene Gurney A. Alycia Rossetti, MD;  Location: [facility];  Service: Gynecology;  Laterality: N/A;   Social History   Tobacco Use   Smoking status: Never   Smokeless tobacco: Never  Vaping Use   Vaping Use: Never used  Substance Use Topics   Alcohol use: No    Alcohol/week: 0.0 standard drinks of alcohol   Drug use: No   Family History  Problem Relation Age of Onset   Diabetes Mother  Kidney disease Mother    Benign prostatic hyperplasia Father    Diabetes Maternal Grandmother    Colon cancer Neg Hx    Esophageal cancer Neg Hx    Stomach cancer Neg Hx    No Known Allergies Current Outpatient Medications on File Prior to Visit  Medication Sig Dispense Refill   atorvastatin (LIPITOR) 10 MG tablet Take 1 tablet (10 mg total) by mouth every evening. 30 tablet 11   Biotin 2500 MCG CAPS      calcium carbonate (TUMS EX) 750 MG chewable tablet Chew 2 tablets by mouth 2 (two) times daily.     COLLAGEN PO Take 1 tablet by mouth daily.     levothyroxine (SYNTHROID) 25 MCG tablet Take 1 tablet (25 mcg total) by mouth daily. 30 tablet 11   Multiple Vitamin (MULTIVITAMIN) tablet Take 1 tablet by mouth daily.     Omega-3 Fatty Acids  (FISH OIL) 1200 MG CAPS Take 1 capsule by mouth daily.     pantoprazole (PROTONIX) 40 MG tablet Take 1 tablet (40 mg total) by mouth daily before breakfast. 30 tablet 11   No current facility-administered medications on file prior to visit.      Review of Systems  Constitutional:  Negative for activity change, appetite change, fatigue, fever and unexpected weight change.  HENT:  Negative for congestion, ear pain, rhinorrhea, sinus pressure and sore throat.   Eyes:  Negative for pain, redness and visual disturbance.  Respiratory:  Negative for cough, shortness of breath and wheezing.   Cardiovascular:  Negative for chest pain and palpitations.  Gastrointestinal:  Negative for abdominal pain, blood in stool, constipation and diarrhea.  Endocrine: Negative for polydipsia and polyuria.  Genitourinary:  Negative for dysuria, frequency and urgency.  Musculoskeletal:  Negative for arthralgias, back pain and myalgias.  Skin:  Negative for pallor and rash.  Allergic/Immunologic: Negative for environmental allergies.  Neurological:  Negative for dizziness, syncope and headaches.  Hematological:  Negative for adenopathy. Does not bruise/bleed easily.  Psychiatric/Behavioral:  Negative for decreased concentration and dysphoric mood. The patient is not nervous/anxious.        Objective:   Physical Exam Constitutional:      General: She is not in acute distress.    Appearance: Normal appearance. She is well-developed. She is obese. She is not ill-appearing or diaphoretic.  HENT:     Head: Normocephalic and atraumatic.  Eyes:     Conjunctiva/sclera: Conjunctivae normal.     Pupils: Pupils are equal, round, and reactive to light.  Neck:     Thyroid: No thyromegaly.     Vascular: No carotid bruit or JVD.  Cardiovascular:     Rate and Rhythm: Normal rate and regular rhythm.     Heart sounds: Normal heart sounds.     No gallop.  Pulmonary:     Effort: Pulmonary effort is normal. No  respiratory distress.     Breath sounds: Normal breath sounds. No wheezing or rales.  Abdominal:     General: There is no distension or abdominal bruit.     Palpations: Abdomen is soft.  Musculoskeletal:     Cervical back: Normal range of motion and neck supple.     Right lower leg: No edema.     Left lower leg: No edema.  Lymphadenopathy:     Cervical: No cervical adenopathy.  Skin:    General: Skin is warm and dry.     Coloration: Skin is not pale.     Findings: No  rash.  Neurological:     Mental Status: She is alert.     Coordination: Coordination normal.     Deep Tendon Reflexes: Reflexes are normal and symmetric. Reflexes normal.  Psychiatric:        Mood and Affect: Mood normal.           Assessment & Plan:   Problem List Items Addressed This Visit       Other   Elevated blood pressure reading - Primary    Improved bp with better lifestyle and 5 lb wt loss  BP: 125/80  Enc pt to keep it up       Other Visit Diagnoses     Need for influenza vaccination       Relevant Orders   Flu Vaccine QUAD 6+ mos PF IM (Fluarix Quad PF) (Completed)

## 2022-11-24 ENCOUNTER — Ambulatory Visit: Payer: 59 | Admitting: Obstetrics & Gynecology

## 2022-11-30 ENCOUNTER — Other Ambulatory Visit (HOSPITAL_COMMUNITY)
Admission: RE | Admit: 2022-11-30 | Discharge: 2022-11-30 | Disposition: A | Discharge status: Home / Self Care | Payer: 59 | Source: Ambulatory Visit | Attending: Obstetrics & Gynecology | Admitting: Obstetrics & Gynecology

## 2022-11-30 ENCOUNTER — Ambulatory Visit (INDEPENDENT_AMBULATORY_CARE_PROVIDER_SITE_OTHER): Payer: 59 | Admitting: Obstetrics & Gynecology

## 2022-11-30 ENCOUNTER — Encounter: Payer: Self-pay | Admitting: Obstetrics & Gynecology

## 2022-11-30 ENCOUNTER — Ambulatory Visit
Admission: RE | Admit: 2022-11-30 | Discharge: 2022-11-30 | Disposition: A | Discharge status: Home / Self Care | Payer: 59 | Source: Ambulatory Visit | Attending: Family Medicine | Admitting: Family Medicine

## 2022-11-30 VITALS — BP 114/70 | HR 102 | Ht 64.0 in | Wt 194.0 lb

## 2022-11-30 DIAGNOSIS — Z01419 Encounter for gynecological examination (general) (routine) without abnormal findings: Secondary | ICD-10-CM | POA: Insufficient documentation

## 2022-11-30 DIAGNOSIS — Z8542 Personal history of malignant neoplasm of other parts of uterus: Secondary | ICD-10-CM

## 2022-11-30 DIAGNOSIS — Z780 Asymptomatic menopausal state: Secondary | ICD-10-CM

## 2022-11-30 DIAGNOSIS — Z90710 Acquired absence of both cervix and uterus: Secondary | ICD-10-CM | POA: Diagnosis not present

## 2022-11-30 DIAGNOSIS — Z9079 Acquired absence of other genital organ(s): Secondary | ICD-10-CM

## 2022-11-30 DIAGNOSIS — Z1231 Encounter for screening mammogram for malignant neoplasm of breast: Secondary | ICD-10-CM

## 2022-11-30 DIAGNOSIS — M85851 Other specified disorders of bone density and structure, right thigh: Secondary | ICD-10-CM | POA: Diagnosis not present

## 2022-11-30 NOTE — Progress Notes (Signed)
Jane Ruiz 02/26/1961 [id number]   History:    61 y.o.  G1P1L1  Married.  Daughter Jane Ruiz   RP:  Established patient presenting for annual gyn exam    HPI: Postmenopause, well on no HRT.  S/P Total Hysterectomy/BSO/Staging for Endometrial Ca stage 3 in 2014.  Adjuvant ChemoRx and RadioRx received.  No pelvic pain. Pap vagina Neg 11/2021. Pap reflex done today. Breasts normal.  Mammo 11/30/2022 today, pending results.  Urine/BMs normal.  BMI 33.3.  Daily treadmill.  Healthy nutrition.  Health Labs with Fam MD.  BD 04/2021 very mild Osteopenia T-Score -1.1.  Colonoscopy 07/2021.  Flu vaccine with Fam MD.    Past medical history,surgical history, family history and social history were all reviewed and documented in the EPIC chart.  Gynecologic History No LMP recorded. Patient has had a hysterectomy.  Obstetric History OB History  Gravida Para Term Preterm AB Living  '1 1 1     1  '$ SAB IAB Ectopic Multiple Live Births               # Outcome Date GA Lbr Len/2nd Weight Sex Delivery Anes PTL Lv  1 Term              ROS: A ROS was performed and pertinent positives and negatives are included in the history. GENERAL: No fevers or chills. HEENT: No change in vision, no earache, sore throat or sinus congestion. NECK: No pain or stiffness. CARDIOVASCULAR: No chest pain or pressure. No palpitations. PULMONARY: No shortness of breath, cough or wheeze. GASTROINTESTINAL: No abdominal pain, nausea, vomiting or diarrhea, melena or bright red blood per rectum. GENITOURINARY: No urinary frequency, urgency, hesitancy or dysuria. MUSCULOSKELETAL: No joint or muscle pain, no back pain, no recent trauma. DERMATOLOGIC: No rash, no itching, no lesions. ENDOCRINE: No polyuria, polydipsia, no heat or cold intolerance. No recent change in weight. HEMATOLOGICAL: No anemia or easy bruising or bleeding. NEUROLOGIC: No headache, seizures, numbness, tingling or weakness. PSYCHIATRIC: No depression, no loss of  interest in normal activity or change in sleep pattern.     Exam:   BP 114/70   Pulse (!) 102   Ht '5\' 4"'$  (1.626 m)   Wt 194 lb (88 kg)   SpO2 98%   BMI 33.30 kg/m   Body mass index is 33.3 kg/m.  General appearance : Well developed well nourished female. No acute distress HEENT: Eyes: no retinal hemorrhage or exudates,  Neck supple, trachea midline, no carotid bruits, no thyroidmegaly Lungs: Clear to auscultation, no rhonchi or wheezes, or rib retractions  Heart: Regular rate and rhythm, no murmurs or gallops Breast:Examined in sitting and supine position were symmetrical in appearance, no palpable masses or tenderness,  no skin retraction, no nipple inversion, no nipple discharge, no skin discoloration, no axillary or supraclavicular lymphadenopathy Abdomen: no palpable masses or tenderness, no rebound or guarding Extremities: no edema or skin discoloration or tenderness  Pelvic: Vulva: Normal             Vagina: No gross lesions or discharge.  Pap reflex done.  Cervix/Uterus absent  Adnexa  Without masses or tenderness  Anus: Normal   Assessment/Plan:  61 y.o. female for annual exam   1. Encounter for routine gynecological examination with Papanicolaou smear of cervix Postmenopause, well on no HRT.  S/P Total Hysterectomy/BSO/Staging for Endometrial Ca stage 3 in 2014.  Adjuvant ChemoRx and RadioRx received.  No pelvic pain. Pap vagina Neg 11/2021. Pap reflex done today. Breasts  normal.  Mammo 11/30/2022 today, pending results.  Urine/BMs normal.  BMI 33.3.  Daily treadmill.  Healthy nutrition.  Health Labs with Fam MD.  BD 04/2021 very mild Osteopenia T-Score -1.1.  Colonoscopy 07/2021.  Flu vaccine with Fam MD. - Cytology - PAP( [facility])  2. History of endometrial cancer - Cytology - PAP( [facility])  3. S/P total hysterectomy and BSO (bilateral salpingo-oophorectomy)  4. Postmenopause Postmenopause, well on no HRT.  S/P Total Hysterectomy/BSO/Staging for  Endometrial Ca stage 3 in 2014.  Adjuvant ChemoRx and RadioRx received.  No pelvic pain.   5. Osteopenia of neck of right femur  BD 04/2021 very mild Osteopenia T-Score -1.1.    Princess Bruins MD, 2:31 PM

## 2022-12-02 LAB — CYTOLOGY - PAP
Diagnosis: NEGATIVE
Diagnosis: REACTIVE

## 2023-01-11 DIAGNOSIS — L821 Other seborrheic keratosis: Secondary | ICD-10-CM | POA: Diagnosis not present

## 2023-01-11 DIAGNOSIS — D225 Melanocytic nevi of trunk: Secondary | ICD-10-CM | POA: Diagnosis not present

## 2023-01-11 DIAGNOSIS — D692 Other nonthrombocytopenic purpura: Secondary | ICD-10-CM | POA: Diagnosis not present

## 2023-01-11 DIAGNOSIS — L570 Actinic keratosis: Secondary | ICD-10-CM | POA: Diagnosis not present

## 2023-02-16 ENCOUNTER — Encounter: Payer: Self-pay | Admitting: Podiatry

## 2023-02-16 ENCOUNTER — Ambulatory Visit (INDEPENDENT_AMBULATORY_CARE_PROVIDER_SITE_OTHER): Payer: 59

## 2023-02-16 ENCOUNTER — Ambulatory Visit: Payer: 59 | Admitting: Podiatry

## 2023-02-16 DIAGNOSIS — M778 Other enthesopathies, not elsewhere classified: Secondary | ICD-10-CM | POA: Diagnosis not present

## 2023-02-16 DIAGNOSIS — M722 Plantar fascial fibromatosis: Secondary | ICD-10-CM

## 2023-02-16 MED ORDER — MELOXICAM 15 MG PO TABS: 15.0000 mg | ORAL_TABLET | Freq: Every day | ORAL | 3 refills | Status: DC

## 2023-02-16 MED ORDER — METHYLPREDNISOLONE 4 MG PO TBPK: ORAL_TABLET | ORAL | 0 refills | Status: DC

## 2023-02-16 MED ORDER — TRIAMCINOLONE ACETONIDE 40 MG/ML IJ SUSP
20.0000 mg | Freq: Once | INTRAMUSCULAR | Status: AC
  Administered 2023-02-16: 20 mg

## 2023-02-16 NOTE — Progress Notes (Signed)
Subjective:  Patient ID: Jane Ruiz, female    DOB: [birth year],  MRN: [id number] HPI Chief Complaint  Patient presents with   Foot Pain    Plantar heel left - aching x few months, but did a long walk last week and think it made it worse, tried Advil/Tylenol-no help   New Patient (Initial Visit)    62 y.o. female presents with the above complaint.   ROS: Denies fever chills nausea vomit muscle aches pains calf pain back pain chest pain shortness of breath.  Past Medical History:  Diagnosis Date   COVID-19 03/2020   endometrial ca 02/12/2013   endometrial, positive nodes   External hemorrhoids    GERD (gastroesophageal reflux disease)    Hernia, hiatal    History of radiation therapy 05/27/2013-07/01/2013   pelvis 45 gray in 25 fractions, 63 gray with high dose brachytherapy   Hx of adenomatous polyp of colon 06/13/2018   Hyperlipidemia    borderline high cholesterol   Hyperplastic colon polyp    Internal hemorrhoids    Migraine    Skin lesion    non maligant skin lesions on head   Past Surgical History:  Procedure Laterality Date   ABDOMINAL HYSTERECTOMY  02/12/13   COLONOSCOPY W/ BIOPSIES  06/15/2009   Dr Watt Climes - distal hyperplastic polyps   RHINOPLASTY     breathing problems- Dr Ernesto Rutherford   ROBOTIC ASSISTED TOTAL HYSTERECTOMY WITH BILATERAL SALPINGO OOPHERECTOMY N/A 02/12/2013   Procedure: ROBOTIC ASSISTED TOTAL HYSTERECTOMY WITH BILATERAL SALPINGO OOPHORECTOMY,  LYMPH NODES;  Surgeon: Imagene Gurney A. Alycia Rossetti, MD;  Location: [facility];  Service: Gynecology;  Laterality: N/A;    Current Outpatient Medications:    atorvastatin (LIPITOR) 10 MG tablet, Take 1 tablet (10 mg total) by mouth every evening., Disp: 30 tablet, Rfl: 11   Biotin 2500 MCG CAPS, , Disp: , Rfl:    COLLAGEN PO, Take 1 tablet by mouth daily., Disp: , Rfl:    levothyroxine (SYNTHROID) 25 MCG tablet, Take 1 tablet (25 mcg total) by mouth daily., Disp: 30 tablet, Rfl: 11   meloxicam (MOBIC) 15 MG tablet, Take 1  tablet (15 mg total) by mouth daily., Disp: 30 tablet, Rfl: 3   methylPREDNISolone (MEDROL DOSEPAK) 4 MG TBPK tablet, 6 day dose pack - take as directed, Disp: 21 tablet, Rfl: 0   Multiple Vitamin (MULTIVITAMIN) tablet, Take 1 tablet by mouth daily., Disp: , Rfl:    Omega-3 Fatty Acids (FISH OIL) 1200 MG CAPS, Take 1 capsule by mouth daily., Disp: , Rfl:    pantoprazole (PROTONIX) 40 MG tablet, Take 1 tablet (40 mg total) by mouth daily before breakfast., Disp: 30 tablet, Rfl: 11  No Known Allergies Review of Systems Objective:  There were no vitals filed for this visit.  General: Well developed, nourished, in no acute distress, alert and oriented x3   Dermatological: Skin is warm, dry and supple bilateral. Nails x 10 are well maintained; remaining integument appears unremarkable at this time. There are no open sores, no preulcerative lesions, no rash or signs of infection present.  Vascular: Dorsalis Pedis artery and Posterior Tibial artery pedal pulses are 2/4 bilateral with immedate capillary fill time. Pedal hair growth present. No varicosities and no lower extremity edema present bilateral.   Neruologic: Grossly intact via light touch bilateral. Vibratory intact via tuning fork bilateral. Protective threshold with Semmes Wienstein monofilament intact to all pedal sites bilateral. Patellar and Achilles deep tendon reflexes 2+ bilateral. No Babinski or clonus noted bilateral.  Musculoskeletal: No gross boney pedal deformities bilateral. No pain, crepitus, or limitation noted with foot and ankle range of motion bilateral. Muscular strength 5/5 in all groups tested bilateral.  Pain on palpation medial calcaneal tubercle of the left heel.  No pain on medial lateral compression of the calcaneus.  Gait: Unassisted, Nonantalgic.    Radiographs:  Radiographs taken today demonstrate osseously mature foot plantar distally oriented calcaneal heel spur with good mineralization.  This calcification  within the soft tissues.  She does have soft tissue increase in density of plantar fascial cannula insertion site of the left heel.    Assessment & Plan:   Assessment: Planter fasciitis left.  Plan: Discussed etiology pathology conservative surgical therapies started on methylprednisolone to be followed by meloxicam.  Injected the left heel 20 mg Kenalog 5 mg Marcaine point of maximal tenderness.  Tolerated procedure well without complications.  Was given both oral and home-going instructions.  Provided her with a plantar fascial brace.     Meng Winterton T. [employer], [place]

## 2023-02-16 NOTE — Patient Instructions (Signed)

## 2023-02-27 ENCOUNTER — Encounter: Payer: Self-pay | Admitting: Podiatry

## 2023-03-14 DIAGNOSIS — L570 Actinic keratosis: Secondary | ICD-10-CM | POA: Diagnosis not present

## 2023-03-14 DIAGNOSIS — L820 Inflamed seborrheic keratosis: Secondary | ICD-10-CM | POA: Diagnosis not present

## 2023-03-14 DIAGNOSIS — Z85828 Personal history of other malignant neoplasm of skin: Secondary | ICD-10-CM | POA: Diagnosis not present

## 2023-03-28 ENCOUNTER — Ambulatory Visit: Payer: 59 | Admitting: Podiatry

## 2023-04-04 ENCOUNTER — Ambulatory Visit: Payer: 59 | Admitting: Podiatry

## 2023-05-13 ENCOUNTER — Other Ambulatory Visit: Payer: Self-pay | Admitting: Family Medicine

## 2023-05-17 ENCOUNTER — Telehealth: Payer: Self-pay | Admitting: Family Medicine

## 2023-05-17 DIAGNOSIS — Z0000 Encounter for general adult medical examination without abnormal findings: Secondary | ICD-10-CM

## 2023-05-17 DIAGNOSIS — E7800 Pure hypercholesterolemia, unspecified: Secondary | ICD-10-CM

## 2023-05-17 DIAGNOSIS — E039 Hypothyroidism, unspecified: Secondary | ICD-10-CM

## 2023-05-17 NOTE — Telephone Encounter (Signed)
-----   Message from Alvina Chou sent at 05/02/2023  3:23 PM EDT ----- Regarding: Lab orders for Thursday, 6.13.24 Patient is scheduled for CPX labs, please order future labs, Thanks , Camelia Eng

## 2023-05-18 ENCOUNTER — Other Ambulatory Visit (INDEPENDENT_AMBULATORY_CARE_PROVIDER_SITE_OTHER): Payer: 59

## 2023-05-18 DIAGNOSIS — E039 Hypothyroidism, unspecified: Secondary | ICD-10-CM

## 2023-05-18 DIAGNOSIS — Z0000 Encounter for general adult medical examination without abnormal findings: Secondary | ICD-10-CM

## 2023-05-18 DIAGNOSIS — E7800 Pure hypercholesterolemia, unspecified: Secondary | ICD-10-CM | POA: Diagnosis not present

## 2023-05-18 LAB — COMPREHENSIVE METABOLIC PANEL
ALT: 10 U/L (ref 0–35)
AST: 14 U/L (ref 0–37)
Albumin: 4.1 g/dL (ref 3.5–5.2)
Alkaline Phosphatase: 88 U/L (ref 39–117)
BUN: 16 mg/dL (ref 6–23)
CO2: 26 mEq/L (ref 19–32)
Calcium: 9.4 mg/dL (ref 8.4–10.5)
Chloride: 104 mEq/L (ref 96–112)
Creatinine, Ser: 0.74 mg/dL (ref 0.40–1.20)
GFR: 85.5 mL/min (ref >60.00)
Glucose, Bld: 91 mg/dL (ref 70–99)
Potassium: 4.3 mEq/L (ref 3.5–5.1)
Sodium: 139 mEq/L (ref 135–145)
Total Bilirubin: 0.5 mg/dL (ref 0.2–1.2)
Total Protein: 6.8 g/dL (ref 6.0–8.3)

## 2023-05-18 LAB — LIPID PANEL
Cholesterol: 153 mg/dL (ref 0–200)
HDL: 34.1 mg/dL — ABNORMAL LOW (ref >39.00)
LDL Cholesterol: 92 mg/dL (ref 0–99)
NonHDL: 118.59
Total CHOL/HDL Ratio: 4
Triglycerides: 134 mg/dL (ref 0.0–149.0)
VLDL: 26.8 mg/dL (ref 0.0–40.0)

## 2023-05-18 LAB — CBC WITH DIFFERENTIAL/PLATELET
Basophils Absolute: 0.1 10*3/uL (ref 0.0–0.1)
Basophils Relative: 1.1 % (ref 0.0–3.0)
Eosinophils Absolute: 0.2 10*3/uL (ref 0.0–0.7)
Eosinophils Relative: 2.2 % (ref 0.0–5.0)
HCT: 37.3 % (ref 36.0–46.0)
Hemoglobin: 12 g/dL (ref 12.0–15.0)
Lymphocytes Relative: 19 % (ref 12.0–46.0)
Lymphs Abs: 1.7 10*3/uL (ref 0.7–4.0)
MCHC: 32.2 g/dL (ref 30.0–36.0)
MCV: 82 fl (ref 78.0–100.0)
Monocytes Absolute: 0.7 10*3/uL (ref 0.1–1.0)
Monocytes Relative: 8.2 % (ref 3.0–12.0)
Neutro Abs: 6.2 10*3/uL (ref 1.4–7.7)
Neutrophils Relative %: 69.5 % (ref 43.0–77.0)
Platelets: 332 10*3/uL (ref 150.0–400.0)
RBC: 4.55 Mil/uL (ref 3.87–5.11)
RDW: 15.8 % — ABNORMAL HIGH (ref 11.5–15.5)
WBC: 8.9 10*3/uL (ref 4.0–10.5)

## 2023-05-18 LAB — TSH: TSH: 4.01 u[IU]/mL (ref 0.35–5.50)

## 2023-05-20 ENCOUNTER — Other Ambulatory Visit: Payer: Self-pay | Admitting: Family Medicine

## 2023-05-22 NOTE — Telephone Encounter (Signed)
Sounds like protonix is not covered  Does she want to try prevacid, aciphex or nexium (generic)?  Not sure what she has been on in past since I inherited this diagnosis from GI   Thanks  Let me know

## 2023-05-22 NOTE — Telephone Encounter (Signed)
See message on Protonix Rx, it says:   lansoprazole (PREVACID) 30 MG capsule       Changed from: pantoprazole (PROTONIX) 40 MG tablet    All pharmacy suggested alternatives are listed below   Pharmacy comment: Alternative Requested:PLAN EXCEEDED; PA REQUIRED.  All Pharmacy Suggested Alternatives:  lansoprazole (PREVACID) 30 MG capsule RABEprazole (ACIPHEX) 20 MG tablet esomeprazole (NEXIUM) 20 MG capsule

## 2023-05-23 NOTE — Telephone Encounter (Signed)
I sent prevacid to try

## 2023-05-23 NOTE — Telephone Encounter (Signed)
Pt notified, pt said she has only been on protonix in the past and she is okay trying whatever Dr. Milinda Antis sends in. Pt advise if new med doesn't work/ ineffective or if she has any problems then let us know.  [facility] [facility]

## 2023-05-25 ENCOUNTER — Encounter: Payer: Self-pay | Admitting: Family Medicine

## 2023-05-25 ENCOUNTER — Ambulatory Visit (INDEPENDENT_AMBULATORY_CARE_PROVIDER_SITE_OTHER): Payer: 59 | Admitting: Family Medicine

## 2023-05-25 VITALS — BP 130/82 | HR 81 | Temp 97.6°F | Ht 64.25 in | Wt 190.1 lb

## 2023-05-25 DIAGNOSIS — M8580 Other specified disorders of bone density and structure, unspecified site: Secondary | ICD-10-CM | POA: Diagnosis not present

## 2023-05-25 DIAGNOSIS — E039 Hypothyroidism, unspecified: Secondary | ICD-10-CM

## 2023-05-25 DIAGNOSIS — E7800 Pure hypercholesterolemia, unspecified: Secondary | ICD-10-CM

## 2023-05-25 DIAGNOSIS — E6609 Other obesity due to excess calories: Secondary | ICD-10-CM | POA: Diagnosis not present

## 2023-05-25 DIAGNOSIS — M722 Plantar fascial fibromatosis: Secondary | ICD-10-CM | POA: Diagnosis not present

## 2023-05-25 DIAGNOSIS — Z0000 Encounter for general adult medical examination without abnormal findings: Secondary | ICD-10-CM

## 2023-05-25 DIAGNOSIS — Z6832 Body mass index (BMI) 32.0-32.9, adult: Secondary | ICD-10-CM

## 2023-05-25 DIAGNOSIS — K219 Gastro-esophageal reflux disease without esophagitis: Secondary | ICD-10-CM | POA: Diagnosis not present

## 2023-05-25 DIAGNOSIS — Z86010 Personal history of colonic polyps: Secondary | ICD-10-CM

## 2023-05-25 DIAGNOSIS — Z79899 Other long term (current) drug therapy: Secondary | ICD-10-CM | POA: Diagnosis not present

## 2023-05-25 DIAGNOSIS — K222 Esophageal obstruction: Secondary | ICD-10-CM | POA: Diagnosis not present

## 2023-05-25 NOTE — Assessment & Plan Note (Addendum)
Reviewed health habits including diet and exercise and skin cancer prevention Reviewed appropriate screening tests for age  Also reviewed health mt list, fam hx and immunization status , as well as social and family history   See HPI Labs reviewed and ordered Mammogram utd 11/2022 Colonoscopy utd 07/2021  Sees gyn, pap in 11/2022-will need new one/ retiring / history of endometrial cancer Dexa 04/2021 , no falls or fracture, discussed bone health and need for vit D and strength training Derm care utd/ uses sun protection PHQ score of 0

## 2023-05-25 NOTE — Assessment & Plan Note (Signed)
Hypothyroidism  Pt has no clinical changes No change in energy level/ hair or skin/ edema and no tremor Lab Results  Component Value Date   TSH 4.01 05/18/2023    Plan to continue levothyroxine 25 mcg daily

## 2023-05-25 NOTE — Patient Instructions (Addendum)
Walk or do other cardio if you can  Add some strength training to your routine, this is important for bone and brain health and can reduce your risk of falls and help your body use insulin properly and regulate weight  Light weights, exercise bands , and internet videos are a good way to start  Yoga (chair or regular), machines , floor exercises or a gym with machines are also good options    Keep watching your blood pressure at home   Keep using sun protection   Ask your gyn doctor who they recommend for you    Take care of yourself

## 2023-05-25 NOTE — Progress Notes (Signed)
Subjective:    Patient ID: Jane Ruiz, female    DOB: Aug 26, 1961, 62 y.o.   MRN: [id number]  HPI  Here for health maintenance exam and to review chronic medical problems   Wt Readings from Last 3 Encounters:  05/25/23 190 lb 2 oz (86.2 kg)  11/30/22 194 lb (88 kg)  08/24/22 189 lb 2 oz (85.8 kg)   32.38 kg/m  Vitals:   05/25/23 1008 05/25/23 1035  BP: (!) 156/90 130/82  Pulse: 81   Temp: 97.6 F (36.4 C)   SpO2: 97%    Doing well  Keeps grandkids 2 d per week   Has been feeling ok overall  Had to quit walking due to plantar fasciitis- just starting back now    Immunization History  Administered Date(s) Administered   Influenza Inj Mdck Quad Pf 09/18/2021   Influenza Split 11/04/2011, 09/05/2016   Influenza, Seasonal, Injecte, Preservative Fre 10/01/2014, 09/12/2015   Influenza,inj,Quad PF,6+ Mos 09/16/2013, 09/23/2017, 09/17/2018, 08/16/2019, 08/24/2022   Td 11/12/2008   Tdap 03/19/2018   Zoster Recombinat (Shingrix) 05/16/2019, 08/06/2019    Health Maintenance Due  Topic Date Due   HIV Screening  Never done   Hepatitis C Screening  Never done   Mammogram 11/2022  Self breast exam: no lumps   Gyn care/ pap 11/2022, neg Dr Milana Obey Has history of endometrial cancer   Colon cancer screening  colonoscopy 07/2021 with 5 y recall    Dexa  04/2021  osteopenia (gyn wanted 3 years)   Falls-none  Fractures-none  Supplements D3 and mvi  Exercise - some walking / going to start weight training   Derm care- goes regularly  Gets regular checks May have some topical treatments for scalp  Uses sun protection   Mood    05/23/2022   10:23 AM 05/19/2021   10:15 AM 05/18/2020    9:53 AM 05/16/2019   10:19 AM 07/13/2017    9:49 AM  Depression screen PHQ 2/9  Decreased Interest 0 0 0 0 0  Down, Depressed, Hopeless 0 0 0 0 0  PHQ - 2 Score 0 0 0 0 0  Altered sleeping  0 0 0   Tired, decreased energy  0 3 0   Change in appetite  0 0 0   Feeling bad or  failure about yourself   0 0 0   Trouble concentrating  0 0 0   Moving slowly or fidgety/restless  0 0 0   Suicidal thoughts  0 0 0   PHQ-9 Score  0 3 0   Difficult doing work/chores  Not difficult at all Not difficult at all     History of GERD with stricture and large HH On lifelong ppi Was protonix, changed to prevacid for ins -then   Hypothyroidism  Pt has no clinical changes No change in energy level/ hair or skin/ edema and no tremor Lab Results  Component Value Date   TSH 4.01 05/18/2023    Levothyroxine 25 mcg daily     Hyperlipidemia Lab Results  Component Value Date   CHOL 153 05/18/2023   CHOL 169 05/13/2022   CHOL 170 05/12/2021   Lab Results  Component Value Date   HDL 34.10 (L) 05/18/2023   HDL 37.30 (L) 05/13/2022   HDL 37.80 (L) 05/12/2021   Lab Results  Component Value Date   LDLCALC 92 05/18/2023   LDLCALC 97 05/13/2022   LDLCALC 99 05/12/2021   Lab Results  Component Value Date  TRIG 134.0 05/18/2023   TRIG 175.0 (H) 05/13/2022   TRIG 162.0 (H) 05/12/2021   Lab Results  Component Value Date   CHOLHDL 4 05/18/2023   CHOLHDL 5 05/13/2022   CHOLHDL 4 05/12/2021   Lab Results  Component Value Date   LDLDIRECT 172.4 12/24/2010   LDLDIRECT 174.9 09/17/2010   LDLDIRECT 164.2 07/06/2010   Atorvastatin 10 mg daily  Fish oil  Working on her diet  Some weight loss  Does eat some egg salad  Eating at least one salad per day     Other labs Lab Results  Component Value Date   WBC 8.9 05/18/2023   HGB 12.0 05/18/2023   HCT 37.3 05/18/2023   MCV 82.0 05/18/2023   PLT 332.0 05/18/2023   CMP     Component Value Date/Time   NA 139 05/18/2023 0900   NA 142 11/25/2013 0754   K 4.3 05/18/2023 0900   K 3.4 (L) 11/25/2013 0754   CL 104 05/18/2023 0900   CL 105 05/15/2013 1249   CO2 26 05/18/2023 0900   CO2 27 11/25/2013 0754   GLUCOSE 91 05/18/2023 0900   GLUCOSE 97 11/25/2013 0754   GLUCOSE 119 (H) 05/15/2013 1249   BUN 16  05/18/2023 0900   BUN 9.8 11/25/2013 0754   CREATININE 0.74 05/18/2023 0900   CREATININE 0.8 11/25/2013 0754   CALCIUM 9.4 05/18/2023 0900   CALCIUM 9.5 11/25/2013 0754   PROT 6.8 05/18/2023 0900   PROT 7.1 11/25/2013 0754   ALBUMIN 4.1 05/18/2023 0900   ALBUMIN 3.6 11/25/2013 0754   AST 14 05/18/2023 0900   AST 15 11/25/2013 0754   ALT 10 05/18/2023 0900   ALT 15 11/25/2013 0754   ALKPHOS 88 05/18/2023 0900   ALKPHOS 81 11/25/2013 0754   BILITOT 0.5 05/18/2023 0900   BILITOT 0.28 11/25/2013 0754   GFR 85.50 05/18/2023 0900   GFRNONAA >90 02/13/2013 0409          Patient Active Problem List   Diagnosis Date Noted   Current use of proton pump inhibitor 05/25/2023   Osteopenia 05/25/2023   Hiatal hernia-10 cm-large 09/20/2021   GERD with stricture 05/19/2021   Bleeding internal hemorrhoids 06/13/2018   Hx of adenomatous polyp of colon 06/13/2018   Hypothyroid 01/04/2016   History of endometrial cancer 02/12/2013   Other screening mammogram 11/04/2011   Obesity 11/04/2011   Routine general medical examination at a health care facility 10/20/2011   PLANTAR FASCIITIS 12/31/2010   Hyperlipidemia 09/24/2010   MENOPAUSAL SYNDROME 07/06/2010   Past Medical History:  Diagnosis Date   COVID-19 03/2020   endometrial ca 02/12/2013   endometrial, positive nodes   External hemorrhoids    GERD (gastroesophageal reflux disease)    Hernia, hiatal    History of radiation therapy 05/27/2013-07/01/2013   pelvis 45 gray in 25 fractions, 63 gray with high dose brachytherapy   Hx of adenomatous polyp of colon 06/13/2018   Hyperlipidemia    borderline high cholesterol   Hyperplastic colon polyp    Internal hemorrhoids    Migraine    Skin lesion    non maligant skin lesions on head   Past Surgical History:  Procedure Laterality Date   ABDOMINAL HYSTERECTOMY  02/12/2013   COLONOSCOPY W/ BIOPSIES  06/15/2009   Dr Ewing Schlein - distal hyperplastic polyps   RHINOPLASTY     breathing  problems- Dr Haroldine Laws   ROBOTIC ASSISTED TOTAL HYSTERECTOMY WITH BILATERAL SALPINGO OOPHERECTOMY N/A 02/12/2013   Procedure: ROBOTIC ASSISTED TOTAL  HYSTERECTOMY WITH BILATERAL SALPINGO OOPHORECTOMY,  LYMPH NODES;  Surgeon: Rejeana Brock A. Duard Brady, MD;  Location: [facility];  Service: Gynecology;  Laterality: N/A;   Social History   Tobacco Use   Smoking status: Never   Smokeless tobacco: Never  Vaping Use   Vaping Use: Never used  Substance Use Topics   Alcohol use: No   Drug use: No   Family History  Problem Relation Age of Onset   Diabetes Mother    Kidney disease Mother    Benign prostatic hyperplasia Father    Diabetes Maternal Grandmother    Colon cancer Neg Hx    Esophageal cancer Neg Hx    Stomach cancer Neg Hx    No Known Allergies Current Outpatient Medications on File Prior to Visit  Medication Sig Dispense Refill   atorvastatin (LIPITOR) 10 MG tablet TAKE 1 TABLET BY MOUTH EVERY DAY IN THE EVENING 30 tablet 0   Biotin 2500 MCG CAPS      COLLAGEN PO Take 1 tablet by mouth daily.     lansoprazole (PREVACID) 30 MG capsule Take 1 capsule (30 mg total) by mouth daily. 90 capsule 1   levothyroxine (SYNTHROID) 25 MCG tablet Take 1 tablet (25 mcg total) by mouth daily before breakfast. 30 tablet 0   Multiple Vitamin (MULTIVITAMIN) tablet Take 1 tablet by mouth daily.     Omega-3 Fatty Acids (FISH OIL) 1200 MG CAPS Take 1 capsule by mouth daily.     No current facility-administered medications on file prior to visit.    Review of Systems  Constitutional:  Negative for activity change, appetite change, fatigue, fever and unexpected weight change.  HENT:  Negative for congestion, ear pain, rhinorrhea, sinus pressure and sore throat.   Eyes:  Negative for pain, redness and visual disturbance.  Respiratory:  Negative for cough, shortness of breath and wheezing.   Cardiovascular:  Negative for chest pain and palpitations.  Gastrointestinal:  Negative for abdominal pain, blood in stool,  constipation and diarrhea.  Endocrine: Negative for polydipsia and polyuria.  Genitourinary:  Negative for dysuria, frequency and urgency.  Musculoskeletal:  Negative for arthralgias, back pain and myalgias.       Foot pain Plantar fasciitis   Skin:  Negative for pallor and rash.  Allergic/Immunologic: Negative for environmental allergies.  Neurological:  Negative for dizziness, syncope and headaches.  Hematological:  Negative for adenopathy. Does not bruise/bleed easily.  Psychiatric/Behavioral:  Negative for decreased concentration and dysphoric mood. The patient is not nervous/anxious.        Objective:   Physical Exam Constitutional:      General: She is not in acute distress.    Appearance: Normal appearance. She is well-developed. She is obese. She is not ill-appearing or diaphoretic.  HENT:     Head: Normocephalic and atraumatic.     Right Ear: Tympanic membrane, ear canal and external ear normal.     Left Ear: Tympanic membrane, ear canal and external ear normal.     Nose: Nose normal. No congestion.     Mouth/Throat:     Mouth: Mucous membranes are moist.     Pharynx: Oropharynx is clear. No posterior oropharyngeal erythema.  Eyes:     General: No scleral icterus.    Extraocular Movements: Extraocular movements intact.     Conjunctiva/sclera: Conjunctivae normal.     Pupils: Pupils are equal, round, and reactive to light.  Neck:     Thyroid: No thyromegaly.     Vascular: No carotid bruit or  JVD.  Cardiovascular:     Rate and Rhythm: Normal rate and regular rhythm.     Pulses: Normal pulses.     Heart sounds: Normal heart sounds.     No gallop.  Pulmonary:     Effort: Pulmonary effort is normal. No respiratory distress.     Breath sounds: Normal breath sounds. No wheezing.     Comments: Good air exch Chest:     Chest wall: No tenderness.  Abdominal:     General: Bowel sounds are normal. There is no distension or abdominal bruit.     Palpations: Abdomen is  soft. There is no mass.     Tenderness: There is no abdominal tenderness.     Hernia: No hernia is present.  Genitourinary:    Comments: Breast and pelvic exam are done by gyn provider   Musculoskeletal:        General: No tenderness. Normal range of motion.     Cervical back: Normal range of motion and neck supple. No rigidity. No muscular tenderness.     Right lower leg: No edema.     Left lower leg: No edema.     Comments: No kyphosis   Lymphadenopathy:     Cervical: No cervical adenopathy.  Skin:    General: Skin is warm and dry.     Coloration: Skin is not pale.     Findings: No erythema or rash.     Comments: Solar lentigines diffusely   Neurological:     Mental Status: She is alert. Mental status is at baseline.     Cranial Nerves: No cranial nerve deficit.     Motor: No abnormal muscle tone.     Coordination: Coordination normal.     Gait: Gait normal.     Deep Tendon Reflexes: Reflexes are normal and symmetric. Reflexes normal.  Psychiatric:        Mood and Affect: Mood normal.        Cognition and Memory: Cognition and memory normal.           Assessment & Plan:   Problem List Items Addressed This Visit       Digestive   GERD with stricture    Pt has to stay on life long PPI Was protonix- recently ins will not cover  Prevacid 30 mg sent to try  Sees GI Will update if not as effective Encouraged to watch diet and keep working on weight loss         Endocrine   Hypothyroid    Hypothyroidism  Pt has no clinical changes No change in energy level/ hair or skin/ edema and no tremor Lab Results  Component Value Date   TSH 4.01 05/18/2023    Plan to continue levothyroxine 25 mcg daily        Musculoskeletal and Integument   PLANTAR FASCIITIS    Some improvement Trying to get back to walking      Osteopenia    Dexa 04/2021 at gyn reviewed  Plan re check at 3 y  No falls or fractures  On D3 and mvi Encouraged to start some strength training            Other   Routine general medical examination at a health care facility - Primary    Reviewed health habits including diet and exercise and skin cancer prevention Reviewed appropriate screening tests for age  Also reviewed health mt list, fam hx and immunization status , as well as social and  family history   See HPI Labs reviewed and ordered Mammogram utd 11/2022 Colonoscopy utd 07/2021  Sees gyn, pap in 11/2022-will need new one/ retiring / history of endometrial cancer Dexa 04/2021 , no falls or fracture, discussed bone health and need for vit D and strength training Derm care utd/ uses sun protection PHQ score of 0      Obesity    Discussed how this problem influences overall health and the risks it imposes  Reviewed plan for weight loss with lower calorie diet (via better food choices and also portion control or program like weight watchers) and exercise building up to or more than 30 minutes 5 days per week including some aerobic activity    Recommend low glycemic diet  Some strength building exercise       Hyperlipidemia    Disc goals for lipids and reasons to control them Rev last labs with pt Rev low sat fat diet in detail  LDL of 92 Overall stable Encouraged exercise to raise HDL   Continues atorvastatin 10 mg daily and fish oil      Hx of adenomatous polyp of colon    Colonoscopy 07/2021 with 5 y recall      Current use of proton pump inhibitor    Will plan B12 and D with next labs

## 2023-05-25 NOTE — Assessment & Plan Note (Signed)
Discussed how this problem influences overall health and the risks it imposes  Reviewed plan for weight loss with lower calorie diet (via better food choices and also portion control or program like weight watchers) and exercise building up to or more than 30 minutes 5 days per week including some aerobic activity    Recommend low glycemic diet  Some strength building exercise

## 2023-05-25 NOTE — Assessment & Plan Note (Signed)
Disc goals for lipids and reasons to control them Rev last labs with pt Rev low sat fat diet in detail  LDL of 92 Overall stable Encouraged exercise to raise HDL   Continues atorvastatin 10 mg daily and fish oil

## 2023-05-25 NOTE — Assessment & Plan Note (Signed)
Dexa 04/2021 at gyn reviewed  Plan re check at 3 y  No falls or fractures  On D3 and mvi Encouraged to start some strength training

## 2023-05-25 NOTE — Assessment & Plan Note (Signed)
Colonoscopy 07/2021 with 5 y recall

## 2023-05-25 NOTE — Assessment & Plan Note (Signed)
Pt has to stay on life long PPI Was protonix- recently ins will not cover  Prevacid 30 mg sent to try  Sees GI Will update if not as effective Encouraged to watch diet and keep working on weight loss

## 2023-05-25 NOTE — Assessment & Plan Note (Signed)
Some improvement Trying to get back to walking

## 2023-05-25 NOTE — Assessment & Plan Note (Signed)
Will plan B12 and D with next labs

## 2023-05-29 ENCOUNTER — Telehealth: Payer: Self-pay | Admitting: Family Medicine

## 2023-05-29 NOTE — Telephone Encounter (Signed)
Pt called stating the rx Dr. Milinda Antis sent in on 6/18, lansoprazole (PREVACID) 30 MG capsule, was on hold. Pt states she was also told the protonix meds she was prescribed is also on hold & requires a pre auth form to be comp be Dr. Milinda Antis for insurance purposes. Pt states Monia Pouch stated they were going to fax over a form today regarding meds. Pt asked if we can fax back the form urgently due to her being almost out of meds. Call back # [phone]

## 2023-05-30 ENCOUNTER — Other Ambulatory Visit (HOSPITAL_COMMUNITY): Payer: Self-pay

## 2023-05-30 ENCOUNTER — Telehealth: Payer: Self-pay

## 2023-05-30 NOTE — Telephone Encounter (Signed)
PA for pantoprazole has been approved, documenting in separate encounter.

## 2023-05-30 NOTE — Telephone Encounter (Signed)
Pharmacy Patient Advocate Encounter  Received notification from [employer] that Prior Authorization for Pantoprazole has been APPROVED from 05/30/2023 to 05/29/2024.Marland Kitchen  PA #/Case ID/Reference #: [id number] KEY: Franne Forts

## 2023-05-30 NOTE — Telephone Encounter (Signed)
Pt advised of PA update, she will check with the pharmacy regarding pick up availability.

## 2023-05-30 NOTE — Telephone Encounter (Signed)
May 30, 2023  Me 05/30/23 11:04 AM Note Pt advised of PA update, she will check with the pharmacy regarding pick up availability.

## 2023-05-30 NOTE — Telephone Encounter (Signed)
Pt called asking for status of med refill for pantoprazole? Preferred pharmacy is [id number] - [place], [place] - [address] AT [facility]. Call back # [phone]

## 2023-05-31 ENCOUNTER — Telehealth: Payer: Self-pay | Admitting: Family Medicine

## 2023-05-31 MED ORDER — PANTOPRAZOLE SODIUM 40 MG PO TBEC: 40.0000 mg | DELAYED_RELEASE_TABLET | Freq: Every day | ORAL | 3 refills | Status: DC

## 2023-05-31 NOTE — Telephone Encounter (Signed)
Patient contacted the office regarding medication pantoprazole. States she had taken this in the past but it was replaced with medication lansoprazole. Patient says this was due to the fact insurance would not cover costs of pantoprazole. Patient says she recently got a prior auth approval from her insurance company and they will cover the costs of the pantoprazole, patient would like to take this medication rather than the lansoprazole. [facility] would like [facility] [place]

## 2023-05-31 NOTE — Telephone Encounter (Signed)
I sent it  Titus Dubin it is going to be covered

## 2023-06-07 ENCOUNTER — Other Ambulatory Visit: Payer: Self-pay | Admitting: Family Medicine

## 2023-06-14 DIAGNOSIS — L821 Other seborrheic keratosis: Secondary | ICD-10-CM | POA: Diagnosis not present

## 2023-06-14 DIAGNOSIS — L918 Other hypertrophic disorders of the skin: Secondary | ICD-10-CM | POA: Diagnosis not present

## 2023-06-14 DIAGNOSIS — Z85828 Personal history of other malignant neoplasm of skin: Secondary | ICD-10-CM | POA: Diagnosis not present

## 2023-06-14 DIAGNOSIS — D225 Melanocytic nevi of trunk: Secondary | ICD-10-CM | POA: Diagnosis not present

## 2023-06-16 DIAGNOSIS — R350 Frequency of micturition: Secondary | ICD-10-CM | POA: Diagnosis not present

## 2023-06-16 DIAGNOSIS — N390 Urinary tract infection, site not specified: Secondary | ICD-10-CM | POA: Diagnosis not present

## 2023-06-29 ENCOUNTER — Other Ambulatory Visit: Payer: Self-pay

## 2023-06-29 ENCOUNTER — Telehealth: Payer: Self-pay | Admitting: Internal Medicine

## 2023-06-29 DIAGNOSIS — K449 Diaphragmatic hernia without obstruction or gangrene: Secondary | ICD-10-CM

## 2023-06-29 DIAGNOSIS — R1319 Other dysphagia: Secondary | ICD-10-CM

## 2023-06-29 NOTE — Telephone Encounter (Signed)
Spoke with patient regarding MD recommendations. Upper GI series scheduled for 07/24/23 at 10:00 am at [facility]. Pt to arrive by 9:45 am. NPO starting at midnight. Radiology number provided to patient if she needs to reschedule. Pt verbalized all understanding, no further questions.

## 2023-06-29 NOTE — Telephone Encounter (Signed)
[facility] called requesting to speak with a nurse regarding not being able to swallow.

## 2023-06-29 NOTE — Addendum Note (Signed)
Addended by: Sharyon Medicus H on: 06/29/2023 12:32 PM   Modules accepted: Orders

## 2023-06-29 NOTE — Telephone Encounter (Signed)
Patient called in with complaints of difficulty swallowing. Denies any pain. She wakes up in the middle of the night with trouble swallowing. She is still doing okay with food/liquids. Takes 40 mg of pantoprazole daily. She was told that if this happened again she may need EGD. She's scheduled to see Dr. Leone Payor on 09/21/23 for follow up. Will route to MD for recommendations in the meantime.

## 2023-06-29 NOTE — Telephone Encounter (Signed)
I think this could be her hiatal hernia bothering her and I want her to do an upper GI series to evaluate.  Asked the radiology people to administer a tablet as well.  Encounter Diagnoses  Name Primary?   Hiatal hernia Yes   Esophageal dysphagia

## 2023-07-24 ENCOUNTER — Ambulatory Visit (HOSPITAL_COMMUNITY)
Admission: RE | Admit: 2023-07-24 | Discharge: 2023-07-24 | Disposition: A | Discharge status: Home / Self Care | Payer: 59 | Source: Ambulatory Visit | Attending: Internal Medicine | Admitting: Internal Medicine

## 2023-07-24 DIAGNOSIS — R1319 Other dysphagia: Secondary | ICD-10-CM | POA: Diagnosis not present

## 2023-07-24 DIAGNOSIS — R1310 Dysphagia, unspecified: Secondary | ICD-10-CM | POA: Diagnosis not present

## 2023-07-24 DIAGNOSIS — K449 Diaphragmatic hernia without obstruction or gangrene: Secondary | ICD-10-CM | POA: Insufficient documentation

## 2023-08-03 ENCOUNTER — Encounter: Payer: Self-pay | Admitting: Internal Medicine

## 2023-08-03 ENCOUNTER — Ambulatory Visit (AMBULATORY_SURGERY_CENTER): Payer: 59 | Admitting: *Deleted

## 2023-08-03 VITALS — Ht 64.25 in | Wt 181.0 lb

## 2023-08-03 DIAGNOSIS — K222 Esophageal obstruction: Secondary | ICD-10-CM

## 2023-08-03 DIAGNOSIS — R1310 Dysphagia, unspecified: Secondary | ICD-10-CM

## 2023-08-03 DIAGNOSIS — K449 Diaphragmatic hernia without obstruction or gangrene: Secondary | ICD-10-CM

## 2023-08-03 NOTE — Progress Notes (Signed)
Pt's name and DOB verified at the beginning of the pre-visit.  Pt had no issues with  ambulating, sitting, states she has no issues laying down or rolling side to side Gave both [facility] main # and MD on call # prior to instructions.  No egg or soy allergy known to patient  No issues known to pt with past sedation with any surgeries or procedures Patient denies ever being intubated Pt has no issues moving head neck or swallowing No FH of Malignant Hyperthermia Pt is not on diet pills Pt is not on home 02  Pt is not on blood thinners  Pt denies issues with constipation  Pt is not on dialysis Pt denise any abnormal heart rhythms  Pt denies any upcoming cardiac testing Pt encouraged to use to use Singlecare or Goodrx to reduce cost  Patient's chart reviewed by Cathlyn Parsons CNRA prior to pre-visit and patient appropriate for the [facility].  Pre-visit completed and red dot placed by patient's name on their procedure day (on provider's schedule).  . Visit in person Pt scales weight is 181 lb Instructed pt why it is important to and  to call if they have any changes in health or new medications. Directed them to the # given and on instructions.   Pt states they will.  Instructions reviewed with pt and pt states understanding. Instructed to review again prior to procedure. Pt states they will.  Instructions given to pt and by my chart

## 2023-08-10 ENCOUNTER — Ambulatory Visit (AMBULATORY_SURGERY_CENTER): Payer: 59 | Admitting: Internal Medicine

## 2023-08-10 ENCOUNTER — Encounter: Payer: Self-pay | Admitting: Internal Medicine

## 2023-08-10 ENCOUNTER — Telehealth: Payer: Self-pay | Admitting: Internal Medicine

## 2023-08-10 VITALS — BP 146/71 | HR 72 | Temp 98.6°F | Resp 13 | Ht 64.25 in | Wt 181.0 lb

## 2023-08-10 DIAGNOSIS — R1310 Dysphagia, unspecified: Secondary | ICD-10-CM | POA: Diagnosis not present

## 2023-08-10 DIAGNOSIS — E039 Hypothyroidism, unspecified: Secondary | ICD-10-CM | POA: Diagnosis not present

## 2023-08-10 DIAGNOSIS — K222 Esophageal obstruction: Secondary | ICD-10-CM

## 2023-08-10 MED ORDER — PANTOPRAZOLE SODIUM 40 MG PO TBEC
40.0000 mg | DELAYED_RELEASE_TABLET | Freq: Two times a day (BID) | ORAL | 3 refills | Status: DC
Start: 2023-08-10 — End: 2023-12-11

## 2023-08-10 MED ORDER — SODIUM CHLORIDE 0.9 % IV SOLN: 500.0000 mL | Freq: Once | INTRAVENOUS | Status: DC

## 2023-08-10 NOTE — Progress Notes (Signed)
Called to room to assist during endoscopic procedure.  Patient ID and intended procedure confirmed with present staff. Received instructions for my participation in the procedure from the performing physician.  

## 2023-08-10 NOTE — Telephone Encounter (Signed)
Spoke to patient after EGD in the [facility] today.  She has a 6 cm hiatal hernia and recurrent esophageal stricture from GERD.  Please refer to Dr. Gaynelle Adu [facility]  Diagnosis is hiatal hernia and GERD, please evaluate for possible fundoplication.

## 2023-08-10 NOTE — Progress Notes (Signed)
Pt's states no medical or surgical changes since previsit or office visit. 

## 2023-08-10 NOTE — Op Note (Addendum)
[facility] Patient Name: Jane Ruiz Procedure Date: 08/10/2023 7:55 AM MRN: [id number] Endoscopist: Iva Boop , MD, [id number] Age: 62 Referring MD:  Date of Birth: Apr 13, 1961 Gender: Female Account #: [id number] Procedure:                Upper GI endoscopy Indications:              Dysphagia, Stricture of the esophagus, For therapy                            of esophageal stricture Medicines:                Monitored Anesthesia Care Procedure:                Pre-Anesthesia Assessment:                           - Prior to the procedure, a History and Physical                            was performed, and patient medications and                            allergies were reviewed. The patient's tolerance of                            previous anesthesia was also reviewed. The risks                            and benefits of the procedure and the sedation                            options and risks were discussed with the patient.                            All questions were answered, and informed consent                            was obtained. Prior Anticoagulants: The patient has                            taken no anticoagulant or antiplatelet agents. ASA                            Grade Assessment: II - A patient with mild systemic                            disease. After reviewing the risks and benefits,                            the patient was deemed in satisfactory condition to                            undergo the procedure.  After obtaining informed consent, the endoscope was                            passed under direct vision. Throughout the                            procedure, the patient's blood pressure, pulse, and                            oxygen saturations were monitored continuously. The                            Olympus scope [id number] was introduced through the                            mouth, and advanced to the  second part of duodenum.                            The upper GI endoscopy was accomplished without                            difficulty. The patient tolerated the procedure                            well. Scope In: Scope Out: Findings:                 One benign-appearing, intrinsic moderate                            (circumferential scarring or stenosis; an endoscope                            may pass) stenosis was found at the                            gastroesophageal junction. This stenosis measured                            less than one cm (in length). The stenosis was                            traversed. A TTS dilator was passed through the                            scope. Dilation with an 18-19-20 mm balloon dilator                            was performed to 20 mm. The dilation site was                            examined and showed moderate mucosal disruption.                            Estimated  blood loss was minimal.                           A 6 cm hiatal hernia was present.                           The gastroesophageal flap valve was visualized                            endoscopically and classified as Hill Grade IV (no                            fold, wide open lumen, hiatal hernia present).                           Multiple diminutive sessile polyps were found in                            the gastric fundus and in the gastric body.                           The cardia and gastric fundus were normal on                            retroflexion.                           The exam was otherwise without abnormality. Complications:            No immediate complications. Estimated Blood Loss:     Estimated blood loss was minimal. Impression:               - Benign-appearing esophageal stenosis. Dilated. 20                            mm. Esophagus also mildly tortuous.                           - 6 cm hiatal hernia. 34-40 cm                           -  Gastroesophageal flap valve classified as Hill                            Grade IV (no fold, wide open lumen, hiatal hernia                            present).                           - Multiple gastric polyps. Diminutive and classic                            appearance of fundic gland polyps as seen before.                           -  The examination was otherwise normal.                           - No specimens collected. Recommendation:           - Patient has a contact number available for                            emergencies. The signs and symptoms of potential                            delayed complications were discussed with the                            patient. Return to normal activities tomorrow.                            Written discharge instructions were provided to the                            patient.                           - Clear liquids x 1 hour then soft foods rest of                            day. Start prior diet tomorrow. GERD diet and                            weight loss would help.                           - Continue present medications. Increase                            pantoprazole to bid from 40 mg qd. I have asked her                            to consider hiatal hernia repair. We discussed in                            recovery - she is willing tomeet w/ surgeon. She                            has been hesitatnt because she has a friend that                            had hiatal hernia repair and there were                            complications and repeat surgeries. Iva Boop, MD 08/10/2023 8:29:05 AM This report has been signed electronically.

## 2023-08-10 NOTE — Progress Notes (Signed)
Report to PACU, RN, vss, BBS= Clear.  

## 2023-08-10 NOTE — Patient Instructions (Addendum)
I dilated the esophagus again.   Since this stricture problem is related to reflux, in order to improve that I am making the pantoprazole twice a day - new Rx sent.  Please think about seeing a surgeon regarding hiatal hernia repair. I can make a referral, just let me know.  I appreciate the opportunity to care for you. Iva Boop, MD, [facility]  Continue present medications. Increase Pantoprazole 40mg  to twice a day.  Post dilation diet (see handout). Start prior diet tomorrow. GERD diet and weigh loss would help.    YOU HAD AN ENDOSCOPIC PROCEDURE TODAY AT THE [facility]:   Refer to the procedure report that was given to you for any specific questions about what was found during the examination.  If the procedure report does not answer your questions, please call your gastroenterologist to clarify.  If you requested that your care partner not be given the details of your procedure findings, then the procedure report has been included in a sealed envelope for you to review at your convenience later.  YOU SHOULD EXPECT: Some feelings of bloating in the abdomen. Passage of more gas than usual.  Walking can help get rid of the air that was put into your GI tract during the procedure and reduce the bloating. If you had a lower endoscopy (such as a colonoscopy or flexible sigmoidoscopy) you may notice spotting of blood in your stool or on the toilet paper. If you underwent a bowel prep for your procedure, you may not have a normal bowel movement for a few days.  Please Note:  You might notice some irritation and congestion in your nose or some drainage.  This is from the oxygen used during your procedure.  There is no need for concern and it should clear up in a day or so.  SYMPTOMS TO REPORT IMMEDIATELY:  Following upper endoscopy (EGD)  Vomiting of blood or coffee ground material  New chest pain or pain under the shoulder blades  Painful or persistently difficult swallowing  New  shortness of breath  Fever of 100F or higher  Black, tarry-looking stools  For urgent or emergent issues, a gastroenterologist can be reached at any hour by calling [phone]. Do not use MyChart messaging for urgent concerns.    DIET: Post dilation diet: Clear liquids for 1 hour  (until 9:30am). Then a Soft diet (see handout) for the rest of today. You may resume your regular diet tomorrow. Drink plenty of fluids but you should avoid alcoholic beverages for 24 hours.  ACTIVITY:  You should plan to take it easy for the rest of today and you should NOT DRIVE or use heavy machinery until tomorrow (because of the sedation medicines used during the test).    FOLLOW UP: Our staff will call the number listed on your records the next business day following your procedure.  We will call around 7:15- 8:00 am to check on you and address any questions or concerns that you may have regarding the information given to you following your procedure. If we do not reach you, we will leave a message.     If any biopsies were taken you will be contacted by phone or by letter within the next 1-3 weeks.  Please call us at [phone] if you have not heard about the biopsies in 3 weeks.    SIGNATURES/CONFIDENTIALITY: You and/or your care partner have signed paperwork which will be entered into your electronic medical record.  These  signatures attest to the fact that that the information above on your After Visit Summary has been reviewed and is understood.  Full responsibility of the confidentiality of this discharge information lies with you and/or your care-partner.

## 2023-08-10 NOTE — Progress Notes (Signed)
[facility] History and Physical   Primary Care Physician:  Tower, Audrie Gallus, MD   Reason for Procedure:    Encounter Diagnosis  Name Primary?   Dysphagia, unspecified type Yes     Plan:    EGD + dilation     HPI: Jane Ruiz is a 62 y.o. female w/ dysphagia - recurrent. S/p EGD and dili 2022. Distal; esophageal ring on recent UGI   Past Medical History:  Diagnosis Date   COVID-19 03/2020   endometrial ca 02/12/2013   endometrial, positive nodes   External hemorrhoids    GERD (gastroesophageal reflux disease)    Hernia, hiatal    History of radiation therapy 05/27/2013-07/01/2013   pelvis 45 gray in 25 fractions, 63 gray with high dose brachytherapy   Hx of adenomatous polyp of colon 06/13/2018   Hyperlipidemia    borderline high cholesterol   Hyperplastic colon polyp    Internal hemorrhoids    Migraine    Osteoporosis    Skin lesion    non maligant skin lesions on head    Past Surgical History:  Procedure Laterality Date   ABDOMINAL HYSTERECTOMY  02/12/2013   COLONOSCOPY     COLONOSCOPY W/ BIOPSIES  06/15/2009   Dr Ewing Schlein - distal hyperplastic polyps   RHINOPLASTY     breathing problems- Dr Haroldine Laws   ROBOTIC ASSISTED TOTAL HYSTERECTOMY WITH BILATERAL SALPINGO OOPHERECTOMY N/A 02/12/2013   Procedure: ROBOTIC ASSISTED TOTAL HYSTERECTOMY WITH BILATERAL SALPINGO OOPHORECTOMY,  LYMPH NODES;  Surgeon: Rejeana Brock A. Duard Brady, MD;  Location: [facility];  Service: Gynecology;  Laterality: N/A;    Prior to Admission medications   Medication Sig Start Date End Date Taking? Authorizing Provider  atorvastatin (LIPITOR) 10 MG tablet TAKE 1 TABLET BY MOUTH EVERY DAY IN THE EVENING 06/07/23  Yes Tower, Audrie Gallus, MD  Biotin 2500 MCG CAPS  02/15/22  Yes [provider]  COLLAGEN PO Take 1 tablet by mouth daily.   Yes [provider]  levothyroxine (SYNTHROID) 25 MCG tablet TAKE 1 TABLET BY MOUTH DAILY BEFORE BREAKFAST. 06/07/23  Yes Tower, Audrie Gallus, MD  Multiple  Vitamin (MULTIVITAMIN) tablet Take 1 tablet by mouth daily.   Yes [provider]  Omega-3 Fatty Acids (FISH OIL) 1200 MG CAPS Take 1 capsule by mouth daily.   Yes [provider]  pantoprazole (PROTONIX) 40 MG tablet Take 1 tablet (40 mg total) by mouth daily. 05/31/23  Yes Tower, Audrie Gallus, MD    Current Outpatient Medications  Medication Sig Dispense Refill   atorvastatin (LIPITOR) 10 MG tablet TAKE 1 TABLET BY MOUTH EVERY DAY IN THE EVENING 90 tablet 2   Biotin 2500 MCG CAPS      COLLAGEN PO Take 1 tablet by mouth daily.     levothyroxine (SYNTHROID) 25 MCG tablet TAKE 1 TABLET BY MOUTH DAILY BEFORE BREAKFAST. 90 tablet 2   Multiple Vitamin (MULTIVITAMIN) tablet Take 1 tablet by mouth daily.     Omega-3 Fatty Acids (FISH OIL) 1200 MG CAPS Take 1 capsule by mouth daily.     pantoprazole (PROTONIX) 40 MG tablet Take 1 tablet (40 mg total) by mouth daily. 90 tablet 3   Current Facility-Administered Medications  Medication Dose Route Frequency Provider Last Rate Last Admin   0.9 %  sodium chloride infusion  500 mL Intravenous Once Iva Boop, MD        Allergies as of 08/10/2023   (No Known Allergies)    Family History  Problem Relation Age  of Onset   Diabetes Mother    Kidney disease Mother    Benign prostatic hyperplasia Father    Diabetes Maternal Grandmother    Colon cancer Neg Hx    Esophageal cancer Neg Hx    Stomach cancer Neg Hx    Colon polyps Neg Hx    Rectal cancer Neg Hx     Social History   Socioeconomic History   Marital status: Married    Spouse name: Not on file   Number of children: 1   Years of education: Not on file   Highest education level: Not on file  Occupational History   Occupation: Producer, 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[employer]    Comment: Retired  Tobacco Use   Smoking status: Never   Smokeless tobacco: Never  Vaping Use   Vaping status: Never Used  Substance and Sexual Activity   Alcohol use: No   Drug use: No    Sexual activity: Yes    Partners: Male    Birth control/protection: Surgical, None    Comment: 1st intercourse- 14, partners- 1,hysterectomy  Other Topics Concern   Not on file  Social History Narrative   Married one daughter born 1982 2 grandchildren   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  Physical Exam: Vital signs BP (!) 152/78   Pulse 77   Temp 98.6 F (37 C)   Ht 5' 4.25" (1.632 m)   Wt 181 lb (82.1 kg)   SpO2 96%   BMI 30.83 kg/m   General:   Alert,  Well-developed, well-nourished, pleasant and cooperative in NAD Lungs:  Clear throughout to auscultation.   Heart:  Regular rate and rhythm; no murmurs, clicks, rubs,  or gallops. Abdomen:  Soft, nontender and nondistended. Normal bowel sounds.   Neuro/Psych:  Alert and cooperative. Normal mood and affect. A and O x 3   @Matia Zelada  Sena Slate, MD, [facility] [phone] (pager) 08/10/2023 8:03 AM@

## 2023-08-11 ENCOUNTER — Telehealth: Payer: Self-pay

## 2023-08-11 NOTE — Telephone Encounter (Signed)
I spoke with Jane Ruiz and told her to be on the look out for [facility] to contact her, I gave her there # [phone]. I have faxed her information over to [facility].

## 2023-08-11 NOTE — Telephone Encounter (Signed)
  Follow up Call-     08/10/2023    7:27 AM 07/19/2021    7:19 AM  Call back number  Post procedure Call Back phone  # [phone]  Permission to leave phone message Yes Yes     Patient questions:  Do you have a fever, pain , or abdominal swelling? No. Pain Score  0 *  Have you tolerated food without any problems? Yes.    Have you been able to return to your normal activities? Yes.    Do you have any questions about your discharge instructions: Diet   No. Medications  No. Follow up visit  No.  Do you have questions or concerns about your Care? No.  Actions: * If pain score is 4 or above: No action needed, pain <4.

## 2023-08-24 NOTE — Telephone Encounter (Signed)
Called [facility] to check status of referral. Patient is scheduled to see Dr. Andrey Campanile on 08/30/23 at 10:00am. Patient is aware.

## 2023-09-21 ENCOUNTER — Ambulatory Visit: Payer: 59 | Admitting: Internal Medicine

## 2023-09-21 ENCOUNTER — Encounter: Payer: Self-pay | Admitting: Internal Medicine

## 2023-09-21 VITALS — BP 132/74 | HR 70 | Ht 64.0 in | Wt 183.0 lb

## 2023-09-21 DIAGNOSIS — K449 Diaphragmatic hernia without obstruction or gangrene: Secondary | ICD-10-CM

## 2023-09-21 DIAGNOSIS — K222 Esophageal obstruction: Secondary | ICD-10-CM

## 2023-09-21 DIAGNOSIS — K219 Gastro-esophageal reflux disease without esophagitis: Secondary | ICD-10-CM | POA: Diagnosis not present

## 2023-09-21 NOTE — Progress Notes (Signed)
Jane Ruiz 62 y.o. [birth year] [id number]  Assessment & Plan:   Encounter Diagnoses  Name Primary?   GERD with stricture Yes   Hiatal hernia     Assessment and Plan    Dysphagia has esolved following esophageal dilation on September 5th. No current complaints of swallowing difficulty. -No further intervention required at this time. repeat dilation prn -Continue Protonix as prescribed. -medium-sized hiatal hernia identified during endoscopy. Discussed potential for surgical repair to prevent future complications. Patient considering surgical consultation with Dr. Andrey Campanile. -Encourage patient to reschedule appointment with Dr. Andrey Campanile for surgical consultation.         Subjective:   Chief Complaint: f/u dysphagia  HPI Discussed the use of AI scribe software for clinical note transcription with the patient, who gave verbal consent to proceed.  Jane Ruiz, a patient with a history of gastroesophageal reflux disease (GERD) and a hiatal hernia, underwent an upper endoscopy and esophageal dilation on September 5th. The procedure also revealed benign stomach polyps. Since the procedure, the patient reports resolution of previous swallowing difficulties. The patient's GERD symptoms are well-controlled on Protonix.  The patient has been considering surgical repair of the hiatal hernia, which was estimated to be about six centimeters. An appointment had been made with Dr. Andrey Campanile, but due to unforeseen circumstances, it was not completed and has yet to be rescheduled.      EGD 08/10/23  - Benign-appearing esophageal stenosis. Dilated. 20                            mm. Esophagus also mildly tortuous.                           - 6 cm hiatal hernia. 34-40 cm                           - Gastroesophageal flap valve classified as Hill                            Grade IV (no fold, wide open lumen, hiatal hernia                            present).                           - Multiple gastric  polyps. Diminutive and classic                            appearance of fundic gland polyps as seen before.                           - The examination was otherwise normal. 07/24/23 UGI IMPRESSION: 1. Numerous rounded filling defects along the gastric mucosa may be due to erosive gastritis. 2. Probable mild mucosal esophageal fold thickening. 3. Moderate hiatal hernia. 4. Occasionally sluggish esophageal motility.    No Known Allergies Current Meds  Medication Sig   atorvastatin (LIPITOR) 10 MG tablet TAKE 1 TABLET BY MOUTH EVERY DAY IN THE EVENING   Biotin 2500 MCG CAPS    COLLAGEN PO Take 1 tablet by mouth daily.   levothyroxine (SYNTHROID) 25 MCG tablet TAKE 1 TABLET BY  MOUTH DAILY BEFORE BREAKFAST.   Multiple Vitamin (MULTIVITAMIN) tablet Take 1 tablet by mouth daily.   Omega-3 Fatty Acids (FISH OIL) 1200 MG CAPS Take 1 capsule by mouth daily.   pantoprazole (PROTONIX) 40 MG tablet Take 1 tablet (40 mg total) by mouth 2 (two) times daily before a meal. 30 mins before breakfast and supper   Past Medical History:  Diagnosis Date   COVID-19 03/2020   endometrial ca 02/12/2013   endometrial, positive nodes   External hemorrhoids    GERD (gastroesophageal reflux disease)    Hernia, hiatal    History of radiation therapy 05/27/2013-07/01/2013   pelvis 45 gray in 25 fractions, 63 gray with high dose brachytherapy   Hx of adenomatous polyp of colon 06/13/2018   Hyperlipidemia    borderline high cholesterol   Hyperplastic colon polyp    Internal hemorrhoids    Migraine    Osteoporosis    Skin lesion    non maligant skin lesions on head   Past Surgical History:  Procedure Laterality Date   ABDOMINAL HYSTERECTOMY  02/12/2013   COLONOSCOPY     COLONOSCOPY W/ BIOPSIES  06/15/2009   Dr Ewing Schlein - distal hyperplastic polyps   RHINOPLASTY     breathing problems- Dr Haroldine Laws   ROBOTIC ASSISTED TOTAL HYSTERECTOMY WITH BILATERAL SALPINGO OOPHERECTOMY N/A 02/12/2013   Procedure:  ROBOTIC ASSISTED TOTAL HYSTERECTOMY WITH BILATERAL SALPINGO OOPHORECTOMY,  LYMPH NODES;  Surgeon: Rejeana Brock A. 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   Review of Systems As perHPI  Objective:   Physical Exam BP 132/74   Pulse 70   Ht 5\' 4"  (1.626 m)   Wt 183 lb (83 kg)   BMI 31.41 kg/m

## 2023-09-21 NOTE — Patient Instructions (Addendum)
VISIT SUMMARY:  During your recent visit, we discussed your history of gastroesophageal reflux disease (GERD), hiatal hernia, and the resolution of your swallowing difficulties following an esophageal dilation procedure. We also talked about your ongoing treatment with Protonix for GERD and your consideration of surgical repair for your hiatal hernia.  YOUR PLAN:  -SWALLOWING DIFFICULTIES (DYSPHAGIA): Your swallowing difficulties have resolved following your esophageal dilation procedure. This means you no longer have trouble swallowing, so no further treatment is needed at this time.  -GASTROESOPHAGEAL REFLUX DISEASE (GERD): Your GERD, a condition where stomach acid frequently flows back into the tube connecting your mouth and stomach (esophagus), is well-controlled with Protonix. Please continue to take this medication as prescribed.  -HIATAL HERNIA: You have a medium-sized hiatal hernia, which is a condition where part of your stomach pushes upward through your diaphragm. We discussed the potential for surgical repair to prevent future complications. You are considering a surgical consultation with Dr. Andrey Campanile.  INSTRUCTIONS:  Please reschedule your appointment with Dr. Andrey Campanile to discuss potential surgical options for your hiatal hernia. It's important to consider this step to prevent any future complications.  If your swallowing difficulties return please send My Chart message or call and get a message to my RN staff.  I appreciate the opportunity to care for you. Stan Head, MD, [facility]

## 2023-10-05 DIAGNOSIS — K449 Diaphragmatic hernia without obstruction or gangrene: Secondary | ICD-10-CM | POA: Diagnosis not present

## 2023-10-05 DIAGNOSIS — K219 Gastro-esophageal reflux disease without esophagitis: Secondary | ICD-10-CM | POA: Diagnosis not present

## 2023-10-05 NOTE — Progress Notes (Signed)
 REFERRING PHYSICIAN:  Avram Lupita BRAVO, MD  PROVIDER:  ERIC DARALYN BLUSH, MD  MRN: [id number] DOB: [birth year] DATE OF ENCOUNTER: 10/05/2023  Subjective     Chief Complaint: New Consultation     History of Present Illness: Jane Ruiz is a 62 y.o. female who is seen today as an office consultation at the request of Dr. Avram for evaluation of New Consultation .    Is accompanied by her husband.  She is referred here for a moderate hiatal hernia and reflux.  She takes pantoprazole  40 mg twice a day.  She states that she does not have heartburn.  She states that acid comes up almost daily.  Occasionally she may have to spit.  Occasionally she may have a hard time swallowing her saliva.  Occasionally she may have a stuck sensation.  Her weight has been normal.  She states that perhaps she is eating smaller portions than she used to compared to a few years ago.  Daily bowel movement.  If she eats something with tomato sauce like pizza or spaghetti it will worsen her reflux and she have to take some Mylanta.  She denies any significant burping or belching or flatulence.  No chest pressure or tightness or chest discomfort or upper abdominal discomfort.  She has had an upper endoscopy and esophagram.  She had a full hysterectomy for endometrial cancer in 2014 otherwise no other abdominal surgery.     Review of Systems: A complete review of systems was obtained from the patient.  I have reviewed this information and discussed as appropriate with the patient.  See HPI as well for other ROS.  ROS    Medical History: Past Medical History:  Diagnosis Date  . GERD (gastroesophageal reflux disease)     There is no problem list on file for this patient.   Past Surgical History:  Procedure Laterality Date  . HYSTERECTOMY    . RHINOPLASTY       No Known Allergies  Current Outpatient Medications on File Prior to Visit  Medication Sig Dispense Refill  . atorvastatin  (LIPITOR) 10  MG tablet Take 10 mg by mouth every evening    . biotin 5 mg Tab Take by mouth    . levothyroxine  (SYNTHROID ) 25 MCG tablet Take 25 mcg by mouth every morning before breakfast (0630)    . multivitamin tablet Take 1 tablet by mouth once daily    . omega 3-dha-epa-fish oil (FISH OIL) 1,000 (120-180) mg Cap Take by mouth    . pantoprazole  (PROTONIX ) 40 MG DR tablet as directed     No current facility-administered medications on file prior to visit.    Family History  Problem Relation Age of Onset  . Diabetes Mother   . Coronary Artery Disease (Blocked arteries around heart) Father      Social History   Tobacco Use  Smoking Status Never  Smokeless Tobacco Never     Social History   Socioeconomic History  . Marital status: Married  Tobacco Use  . Smoking status: Never  . Smokeless tobacco: Never  Substance and Sexual Activity  . Alcohol use: Never  . Drug use: Never    Objective:    Vitals:   10/05/23 1018 10/05/23 1019  BP: (!) 158/84   Pulse: 99   Temp: 36.7 C (98.1 F)   SpO2: 96%   Weight: 82.1 kg (181 lb)   Height: 162.6 cm (5' 4)   PainSc:  0-No pain  PainLoc:  Abdomen  Body mass index is 31.07 kg/m.  Constitutional: NAD; conversant; no deformities Eyes: Moist conjunctiva; no lid lag; anicteric; PERRL Neck: Trachea midline; no thyromegaly Lungs: Normal respiratory effort; no tactile fremitus CV: RRR; no palpable thrills; no pitting edema GI: Abd soft, nontender, nondistended, old trocar scars; no palpable hepatosplenomegaly MSK: Normal gait; no clubbing/cyanosis Psychiatric: Appropriate affect; alert and oriented x3 Lymphatic: No palpable cervical or axillary lymphadenopathy Skin: no Rash, lesions or jaundice    Labs, Imaging and Diagnostic Testing: UPPER GI SERIES WITH KUB   TECHNIQUE: After obtaining a scout radiograph a routine upper GI series was performed using thin and high density barium.   FLUOROSCOPY: Radiation Exposure Index (as  provided by the fluoroscopic device): 18.8 mGy Kerma   COMPARISON:  CT abdomen pelvis 08/31/2016.   FINDINGS: Scout view of the abdomen shows a normal bowel gas pattern. No unexpected radiopaque calculi.   Double contrast examination of the upper gastrointestinal tract shows occasionally sluggish esophageal motility with tertiary contractions. There may be mild esophageal fold thickening. Distal esophageal ring through which a 13 mm barium tablet did pass. Moderate hiatal hernia.   There are numerous small round filling defects along the gastric wall, some of which have central pools of barium, measuring up to approximately 11 mm in size. Stomach and duodenal bulb are otherwise unremarkable.   IMPRESSION: 1. Numerous rounded filling defects along the gastric mucosa may be due to erosive gastritis. 2. Probable mild mucosal esophageal fold thickening. 3. Moderate hiatal hernia. 4. Occasionally sluggish esophageal motility.  EGD 08/10/23   Assessment and Plan:     Diagnoses and all orders for this visit:  Hiatal hernia with gastroesophageal reflux     The patient was given an educational booklet regarding GERD which also talks about hiatal hernias.  I discussed the anatomy of the foregut.  We discussed the evaluation and workup for heartburn and hiatal hernias.  We then discussed steps of a robotic hiatal hernia repair with possible mesh with possible fundoplication/wrap.  We discussed the typical hospitalization.  We discussed the typical recovery.  We discussed the diet transition over the course of 8 to 12 weeks.  We discussed the need to change eating techniques and behaviors in order to mitigate postoperative pain and discomfort.  We discussed that hiatal hernia repair/reflux surgery is not perfect .  We discussed that there are typical quirks but our goal is to plan and only have minor quirks.  We discussed what some of those quirks could be such as issues with tolerance  to certain food textures like breads or pastas, burping.  We did discuss the possibility of needing ongoing reflux medication.  We then discussed the risk of surgery included but not limited to bleeding, infection, injury to surrounding structures, injury to the lung/aorta, injury to the vagus nerves, hiatal hernia recurrence, clot formation, perioperative cardiac and pulmonary events.  We discussed slipped wrap.  We discussed the issues with vagal nerve injury such as gastroparesis and diarrhea.  We discussed gas bloat.  We discussed issues with potential mesh insertion such as dysphagia/fistula.  We discussed that I am typically selective in whether or not we use absorbable mesh.  We discussed the recurrence risk is at least 15%.  We discussed that with subsequent diaphragm/esophageal surgery it is technically more complicated and higher risk as well as satisfaction scores decrease.  We also discussed pros and cons of not undergoing repair.  We discussed the potential progression of her hiatal hernia which could cause  additional symptoms such as Cameron ulcers or gastric outlet obstruction.  She has an acquaintance who had this type of surgery and had what sounds like complications with tacks  -based on that description it sounds like that patient might of had mesh which was secured with tacks.  We discussed that if she would like to move forward then we would like to get esophageal manometry to look at her esophageal motility.  She is going to think about it and let us  know how she would like to proceed.  I told her to call the office if she had additional questions or if she would like to be seen again and/or if she would like to proceed with surgery at which time we would need to get esophageal manometry.  I told her that it can take quite some time to get manometry done   This patient encounter took 62 minutes today to perform the following: take history, perform exam, review outside records,  interpret imaging, counsel the patient on their diagnosis and document encounter, findings & plan in the EHR  Return if symptoms worsen or fail to improve.  ERIC DARALYN BLUSH, MD  General, Minimally Invasive, & Bariatric Surgery

## 2023-12-11 ENCOUNTER — Telehealth: Payer: Self-pay | Admitting: *Deleted

## 2023-12-11 MED ORDER — ATORVASTATIN CALCIUM 10 MG PO TABS: 10.0000 mg | ORAL_TABLET | Freq: Every day | ORAL | 1 refills | Status: DC

## 2023-12-11 MED ORDER — LEVOTHYROXINE SODIUM 25 MCG PO TABS: 25.0000 ug | ORAL_TABLET | Freq: Every day | ORAL | 1 refills | Status: DC

## 2023-12-11 MED ORDER — PANTOPRAZOLE SODIUM 40 MG PO TBEC: 40.0000 mg | DELAYED_RELEASE_TABLET | Freq: Two times a day (BID) | ORAL | 1 refills | Status: DC

## 2023-12-11 NOTE — Telephone Encounter (Signed)
 Copied from CRM [id number]. Topic: Clinical - Prescription Issue >> Dec 11, 2023  9:31 AM Jane Ruiz wrote: Reason for CRM: Patient is requesting all her prescriptions be sent now to   [facility] [id number] [place], [place] - [address] [place] [address] - Phone: [phone] Fax: [phone] Please send a 90 day supply for atorvastatin  (LIPITOR) 10 MG tablet levothyroxine  (SYNTHROID ) 25 MCG tablet pantoprazole  (PROTONIX ) 40 MG tablet

## 2023-12-11 NOTE — Telephone Encounter (Signed)
 done

## 2023-12-29 ENCOUNTER — Ambulatory Visit: Payer: Self-pay | Admitting: Family Medicine

## 2023-12-29 NOTE — Telephone Encounter (Signed)
Chief Complaint: R hand numbness Symptoms: R hand numbness, tingling, and slight swelling Frequency: 1 week Pertinent Negatives: Patient denies loss of sensation of the R hand, slurred speech, right-sided weakness, difficulty walking, passing out, headache Disposition: [] ED /[x] Urgent Care (no appt availability in office) / [] Appointment(In office/virtual)/ []  [facility] [facility]/ [] Home Care/ [] Refused Recommended Disposition /[]  Mobile Bus/ []  Follow-up with PCP  Additional Notes: Pt reports 1 week of numbness with tingling and slight swelling in the R hand. Pt states symptoms not precipitated by any injury from what she can remember. Pt denies using a keyboard etc, states she is retired. Pt endorses numbness in that hand but states she still has full sensation when touching the hand. Pt denies right-sided weakness or paralysis, denies slurred speech, denies headache, denies difficulty walking. Denies redness or signs of infection. Denies fever. Denies SOB and CP. Per protocol, pt advised to be seen within 24 hours. No appts available at pt's PCP office in that time frame. Pt urged to be seen at Urgent Care. Pt states she will go to [facility] [place] either tonight or tomorrow AM. Pt advised to go to the ED for any worsening, specifically for symptoms like one-sided weakness, slurred speech, confusion, difficulty walking or talking, headache. Pt verbalized understanding.   Copied from CRM [id number]. Topic: Clinical - Red Word Triage >> Dec 29, 2023  4:26 PM Corin V wrote: [facility] that prompted transfer to Nurse Triage: Patient's right hand is going numb on and off and she has some slight swelling. There is no pain, but it feels like needles are poking the hand when it is numb. Reason for Disposition  Numbness (i.e., loss of sensation) in hand or fingers  (Exception: Just tingling; numbness present > 2 weeks.)  Answer Assessment - Initial Assessment Questions 1. ONSET: "When  did the pain start?"     One week of numbness, tingling in R hand keeping her up at night, never had this before 2. LOCATION: "Where is the pain located?"     Right hand 3. PAIN: "How bad is the pain?" (Scale 1-10; or mild, moderate, severe)   - MILD (1-3): doesn't interfere with normal activities   - MODERATE (4-7): interferes with normal activities (e.g., work or school) or awakens from sleep   - SEVERE (8-10): excruciating pain, unable to use hand at all     "Tingling like needles, and sometimes my elbow hurts." 9/10 pain. "I can use the hand, it's just when it gets to a certain point after it gets to hurting. It's swollen a bit." 4. WORK OR EXERCISE: "Has there been any recent work or exercise that involved this part (i.e., hand or wrist) of the body?"     Nothing she can think of 5. CAUSE: "What do you think is causing the pain?"     I don't know 6. AGGRAVATING FACTORS: "What makes the pain worse?" (e.g., using computer)     I'm taking ibuprofen, mostly at night  7. OTHER SYMPTOMS: "Do you have any other symptoms?" (e.g., neck pain, swelling, rash, numbness, fever)     Slight swelling, numbness, tingling. No fever. No CP or SOB. Pt reports she still has sensation. Denies weakness/paralysis in that right arm. Denies difficulty walking or talking. Denies slurred speech. No headache.  Protocols used: Hand and Wrist Pain-A-AH

## 2024-01-01 ENCOUNTER — Telehealth: Payer: Self-pay

## 2024-01-01 ENCOUNTER — Other Ambulatory Visit: Payer: Self-pay | Admitting: Family Medicine

## 2024-01-01 DIAGNOSIS — Z1231 Encounter for screening mammogram for malignant neoplasm of breast: Secondary | ICD-10-CM

## 2024-01-01 NOTE — Telephone Encounter (Signed)
Pt had called earlier today and scheduled NV for pneumonia shot; pt has new ins [employer] plus. Pt will call per Amy front office mgr that [facility] is in network and I will send note to Dr Milinda Antis to determine which pneumonia shot pt should have.pt will cb if [facility] is out of network otherwise pt will come for NV 01/09/24 at 10:30. Immunization order form in Dr Lucretia Roers in box.

## 2024-01-01 NOTE — Telephone Encounter (Signed)
Prevnar 20 please  I will sign form

## 2024-01-01 NOTE — Telephone Encounter (Signed)
Aware, will watch for correspondence

## 2024-01-09 ENCOUNTER — Ambulatory Visit (INDEPENDENT_AMBULATORY_CARE_PROVIDER_SITE_OTHER): Payer: Medicare Other

## 2024-01-09 DIAGNOSIS — Z23 Encounter for immunization: Secondary | ICD-10-CM | POA: Diagnosis not present

## 2024-01-09 NOTE — Progress Notes (Signed)
Per orders of Dr. [name], injection of prevnar 20 given by Lewanda Rife in left deltoid. Patient tolerated injection well.  Pt waited after injection without problem or complaint.,

## 2024-01-18 ENCOUNTER — Ambulatory Visit: Payer: 59

## 2024-01-18 ENCOUNTER — Ambulatory Visit
Admission: RE | Admit: 2024-01-18 | Discharge: 2024-01-18 | Disposition: A | Discharge status: Home / Self Care | Payer: 59 | Source: Ambulatory Visit | Attending: Family Medicine | Admitting: Family Medicine

## 2024-01-18 DIAGNOSIS — Z1231 Encounter for screening mammogram for malignant neoplasm of breast: Secondary | ICD-10-CM

## 2024-01-22 DIAGNOSIS — K080 Exfoliation of teeth due to systemic causes: Secondary | ICD-10-CM | POA: Diagnosis not present

## 2024-02-13 DIAGNOSIS — H5203 Hypermetropia, bilateral: Secondary | ICD-10-CM | POA: Diagnosis not present

## 2024-03-18 DIAGNOSIS — Z1231 Encounter for screening mammogram for malignant neoplasm of breast: Secondary | ICD-10-CM | POA: Diagnosis not present

## 2024-03-18 DIAGNOSIS — L918 Other hypertrophic disorders of the skin: Secondary | ICD-10-CM | POA: Diagnosis not present

## 2024-03-18 DIAGNOSIS — D2272 Melanocytic nevi of left lower limb, including hip: Secondary | ICD-10-CM | POA: Diagnosis not present

## 2024-03-18 DIAGNOSIS — D225 Melanocytic nevi of trunk: Secondary | ICD-10-CM | POA: Diagnosis not present

## 2024-03-18 DIAGNOSIS — L821 Other seborrheic keratosis: Secondary | ICD-10-CM | POA: Diagnosis not present

## 2024-03-18 DIAGNOSIS — L820 Inflamed seborrheic keratosis: Secondary | ICD-10-CM | POA: Diagnosis not present

## 2024-03-18 LAB — HM MAMMOGRAPHY

## 2024-04-11 DIAGNOSIS — K080 Exfoliation of teeth due to systemic causes: Secondary | ICD-10-CM | POA: Diagnosis not present

## 2024-04-24 DIAGNOSIS — K080 Exfoliation of teeth due to systemic causes: Secondary | ICD-10-CM | POA: Diagnosis not present

## 2024-05-19 ENCOUNTER — Telehealth: Payer: Self-pay | Admitting: Family Medicine

## 2024-05-19 DIAGNOSIS — K219 Gastro-esophageal reflux disease without esophagitis: Secondary | ICD-10-CM

## 2024-05-19 DIAGNOSIS — E7800 Pure hypercholesterolemia, unspecified: Secondary | ICD-10-CM

## 2024-05-19 DIAGNOSIS — E039 Hypothyroidism, unspecified: Secondary | ICD-10-CM

## 2024-05-19 DIAGNOSIS — Z79899 Other long term (current) drug therapy: Secondary | ICD-10-CM

## 2024-05-19 NOTE — Telephone Encounter (Signed)
-----   Message from Gerry Krone sent at 05/13/2024  9:06 AM EDT ----- Regarding: Lab orders for Mon, 6.16.25 Patient is scheduled for CPX labs, please order future labs, Thanks , Anselmo Kings

## 2024-05-20 ENCOUNTER — Other Ambulatory Visit (INDEPENDENT_AMBULATORY_CARE_PROVIDER_SITE_OTHER): Payer: Medicare Other

## 2024-05-20 ENCOUNTER — Other Ambulatory Visit: Payer: 59

## 2024-05-20 DIAGNOSIS — Z79899 Other long term (current) drug therapy: Secondary | ICD-10-CM | POA: Diagnosis not present

## 2024-05-20 DIAGNOSIS — E039 Hypothyroidism, unspecified: Secondary | ICD-10-CM | POA: Diagnosis not present

## 2024-05-20 DIAGNOSIS — E7800 Pure hypercholesterolemia, unspecified: Secondary | ICD-10-CM | POA: Diagnosis not present

## 2024-05-20 DIAGNOSIS — K219 Gastro-esophageal reflux disease without esophagitis: Secondary | ICD-10-CM | POA: Diagnosis not present

## 2024-05-20 DIAGNOSIS — K222 Esophageal obstruction: Secondary | ICD-10-CM | POA: Diagnosis not present

## 2024-05-21 ENCOUNTER — Ambulatory Visit: Payer: Self-pay | Admitting: Family Medicine

## 2024-05-21 LAB — COMPREHENSIVE METABOLIC PANEL WITH GFR
AG Ratio: 1.7 (calc) (ref 1.0–2.5)
ALT: 12 U/L (ref 6–29)
AST: 13 U/L (ref 10–35)
Albumin: 4.2 g/dL (ref 3.6–5.1)
Alkaline phosphatase (APISO): 82 U/L (ref 37–153)
BUN: 12 mg/dL (ref 7–25)
CO2: 23 mmol/L (ref 20–32)
Calcium: 9.7 mg/dL (ref 8.6–10.4)
Chloride: 105 mmol/L (ref 98–110)
Creat: 0.73 mg/dL (ref 0.50–1.05)
Globulin: 2.5 g/dL (ref 1.9–3.7)
Glucose, Bld: 86 mg/dL (ref 65–99)
Potassium: 4.3 mmol/L (ref 3.5–5.3)
Sodium: 141 mmol/L (ref 135–146)
Total Bilirubin: 0.4 mg/dL (ref 0.2–1.2)
Total Protein: 6.7 g/dL (ref 6.1–8.1)
eGFR: 91 mL/min/{1.73_m2} (ref >=60)

## 2024-05-21 LAB — TSH: TSH: 4.07 m[IU]/L (ref 0.40–4.50)

## 2024-05-21 LAB — CBC WITH DIFFERENTIAL/PLATELET
Absolute Lymphocytes: 1720 {cells}/uL (ref 850–3900)
Absolute Monocytes: 765 {cells}/uL (ref 200–950)
Basophils Absolute: 77 {cells}/uL (ref 0–200)
Basophils Relative: 0.9 %
Eosinophils Absolute: 241 {cells}/uL (ref 15–500)
Eosinophils Relative: 2.8 %
HCT: 39.2 % (ref 35.0–45.0)
Hemoglobin: 12.5 g/dL (ref 11.7–15.5)
MCH: 26.6 pg — ABNORMAL LOW (ref 27.0–33.0)
MCHC: 31.9 g/dL — ABNORMAL LOW (ref 32.0–36.0)
MCV: 83.4 fL (ref 80.0–100.0)
MPV: 9.9 fL (ref 7.5–12.5)
Monocytes Relative: 8.9 %
Neutro Abs: 5796 {cells}/uL (ref 1500–7800)
Neutrophils Relative %: 67.4 %
Platelets: 309 10*3/uL (ref 140–400)
RBC: 4.7 10*6/uL (ref 3.80–5.10)
RDW: 13.9 % (ref 11.0–15.0)
Total Lymphocyte: 20 %
WBC: 8.6 10*3/uL (ref 3.8–10.8)

## 2024-05-21 LAB — LIPID PANEL
Cholesterol: 144 mg/dL (ref <200)
HDL: 40 mg/dL — ABNORMAL LOW (ref >=50)
LDL Cholesterol (Calc): 78 mg/dL
Non-HDL Cholesterol (Calc): 104 mg/dL (ref <130)
Total CHOL/HDL Ratio: 3.6 (calc) (ref <5.0)
Triglycerides: 165 mg/dL — ABNORMAL HIGH (ref <150)

## 2024-05-21 LAB — VITAMIN B12: Vitamin B-12: 467 pg/mL (ref 200–1100)

## 2024-05-27 ENCOUNTER — Ambulatory Visit (INDEPENDENT_AMBULATORY_CARE_PROVIDER_SITE_OTHER): Payer: 59 | Admitting: Family Medicine

## 2024-05-27 ENCOUNTER — Encounter: Payer: Self-pay | Admitting: Family Medicine

## 2024-05-27 ENCOUNTER — Other Ambulatory Visit: Payer: Self-pay | Admitting: Family Medicine

## 2024-05-27 VITALS — BP 126/78 | HR 84 | Temp 98.4°F | Ht 64.0 in | Wt 180.1 lb

## 2024-05-27 DIAGNOSIS — K219 Gastro-esophageal reflux disease without esophagitis: Secondary | ICD-10-CM

## 2024-05-27 DIAGNOSIS — Z8542 Personal history of malignant neoplasm of other parts of uterus: Secondary | ICD-10-CM

## 2024-05-27 DIAGNOSIS — E6609 Other obesity due to excess calories: Secondary | ICD-10-CM

## 2024-05-27 DIAGNOSIS — K222 Esophageal obstruction: Secondary | ICD-10-CM

## 2024-05-27 DIAGNOSIS — Z0000 Encounter for general adult medical examination without abnormal findings: Secondary | ICD-10-CM | POA: Diagnosis not present

## 2024-05-27 DIAGNOSIS — M8580 Other specified disorders of bone density and structure, unspecified site: Secondary | ICD-10-CM | POA: Diagnosis not present

## 2024-05-27 DIAGNOSIS — E66811 Obesity, class 1: Secondary | ICD-10-CM

## 2024-05-27 DIAGNOSIS — E7800 Pure hypercholesterolemia, unspecified: Secondary | ICD-10-CM

## 2024-05-27 DIAGNOSIS — Z860101 Personal history of adenomatous and serrated colon polyps: Secondary | ICD-10-CM

## 2024-05-27 DIAGNOSIS — E039 Hypothyroidism, unspecified: Secondary | ICD-10-CM | POA: Diagnosis not present

## 2024-05-27 DIAGNOSIS — E2839 Other primary ovarian failure: Secondary | ICD-10-CM | POA: Insufficient documentation

## 2024-05-27 DIAGNOSIS — Z79899 Other long term (current) drug therapy: Secondary | ICD-10-CM | POA: Diagnosis not present

## 2024-05-27 MED ORDER — PANTOPRAZOLE SODIUM 40 MG PO TBEC: 40.0000 mg | DELAYED_RELEASE_TABLET | Freq: Two times a day (BID) | ORAL | 3 refills | Status: AC

## 2024-05-27 NOTE — Assessment & Plan Note (Signed)
 Dexa ordered

## 2024-05-27 NOTE — Assessment & Plan Note (Signed)
 Dexa ordered  No falls or fracture Discussed fall prevention, supplements and exercise for bone density

## 2024-05-27 NOTE — Telephone Encounter (Unsigned)
 Copied from CRM [id number]. Topic: Clinical - Medication Refill >> May 27, 2024 11:46 AM Thersia C wrote: Medication: levothyroxine  (SYNTHROID ) 25 MCG tablet atorvastatin  (LIPITOR) 10 MG tablet  Has the patient contacted their pharmacy? Yes (Agent: If no, request that the patient contact the pharmacy for the refill. If patient does not wish to contact the pharmacy document the reason why and proceed with request.) (Agent: If yes, when and what did the pharmacy advise?)  This is the patient's preferred pharmacy:  [facility] [id number] [place], [place] - [address] [place] [address] Phone: [phone] Fax: [phone]  Is this the correct pharmacy for this prescription? Yes If no, delete pharmacy and type the correct one.   Has the prescription been filled recently? No  Is the patient out of the medication? Yes  Has the patient been seen for an appointment in the last year OR does the patient have an upcoming appointment? Yes  Can we respond through MyChart? Yes  Agent: Please be advised that Rx refills may take up to 3 business days. We ask that you follow-up with your pharmacy.   ----------------------------------------------------------------------- From previous Reason for Contact - Lab/Test Results: Reason for CRM:

## 2024-05-27 NOTE — Progress Notes (Signed)
 Subjective:    Jane Ruiz is a 63 y.o. female who presents for a Welcome to Medicare exam.   Cardiac Risk Factors include: none      Objective:    Today's Vitals   05/27/24 1004  BP: 126/78  Pulse: 84  Temp: 98.4 F (36.9 C)  TempSrc: Oral  SpO2: 97%  Weight: 180 lb 2 oz (81.7 kg)  Height: 5' 4 (1.626 m)  Body mass index is 30.92 kg/m.  Wt Readings from Last 3 Encounters:  05/27/24 180 lb 2 oz (81.7 kg)  09/21/23 183 lb (83 kg)  08/10/23 181 lb (82.1 kg)     Medications Outpatient Encounter Medications as of 05/27/2024  Medication Sig   atorvastatin  (LIPITOR) 10 MG tablet Take 1 tablet (10 mg total) by mouth daily.   Biotin 2500 MCG CAPS    COLLAGEN PO Take 1 tablet by mouth daily.   levothyroxine  (SYNTHROID ) 25 MCG tablet Take 1 tablet (25 mcg total) by mouth daily before breakfast.   Multiple Vitamin (MULTIVITAMIN) tablet Take 1 tablet by mouth daily.   Omega-3 Fatty Acids (FISH OIL) 1200 MG CAPS Take 1 capsule by mouth daily.   [DISCONTINUED] pantoprazole  (PROTONIX ) 40 MG tablet Take 1 tablet (40 mg total) by mouth 2 (two) times daily before a meal. 30 mins before breakfast and supper   pantoprazole  (PROTONIX ) 40 MG tablet Take 1 tablet (40 mg total) by mouth 2 (two) times daily before a meal. 30 mins before breakfast and supper   No facility-administered encounter medications on file as of 05/27/2024.     History: Past Medical History:  Diagnosis Date   COVID-19 03/2020   endometrial ca 02/12/2013   endometrial, positive nodes   External hemorrhoids    GERD (gastroesophageal reflux disease)    Hernia, hiatal    History of radiation therapy 05/27/2013-07/01/2013   pelvis 45 gray in 25 fractions, 63 gray with high dose brachytherapy   Hx of adenomatous polyp of colon 06/13/2018   Hyperlipidemia    borderline high cholesterol   Hyperplastic colon polyp    Internal hemorrhoids    Migraine    Osteoporosis    Skin lesion    non maligant skin lesions  on head   Past Surgical History:  Procedure Laterality Date   ABDOMINAL HYSTERECTOMY  02/12/2013   COLONOSCOPY     COLONOSCOPY W/ BIOPSIES  06/15/2009   Dr Rosalie - distal hyperplastic polyps   RHINOPLASTY     breathing problems- Dr Floy   ROBOTIC ASSISTED TOTAL HYSTERECTOMY WITH BILATERAL SALPINGO OOPHERECTOMY N/A 02/12/2013   Procedure: ROBOTIC ASSISTED TOTAL HYSTERECTOMY WITH BILATERAL SALPINGO OOPHORECTOMY,  LYMPH NODES;  Surgeon: Elenore A. Dodie, MD;  Location: [facility];  Service: Gynecology;  Laterality: N/A;    Family History  Problem Relation Age of Onset   Diabetes Mother    Kidney disease Mother    Benign prostatic hyperplasia Father    Diabetes Maternal Grandmother    Colon cancer Neg Hx    Esophageal cancer Neg Hx    Stomach cancer Neg Hx    Colon polyps Neg Hx    Rectal cancer Neg Hx    Social History   Occupational History   Occupation: Producer, 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[employer]    Comment: Retired  Tobacco Use   Smoking status: Never   Smokeless tobacco: Never  Vaping Use   Vaping status: Never Used  Substance and Sexual Activity   Alcohol use: No  Drug use: No   Sexual activity: Yes    Partners: Male    Birth control/protection: Surgical, None    Comment: 1st intercourse- 14, partners- 1,hysterectomy    Tobacco Counseling Counseling given: Not Answered   Immunizations and Health Maintenance Immunization History  Administered Date(s) Administered   Influenza Inj Mdck Quad Pf 09/18/2021   Influenza Split 11/04/2011, 09/05/2016   Influenza, Seasonal, Injecte, Preservative Fre 10/01/2014, 09/12/2015, 09/24/2023   Influenza,inj,Quad PF,6+ Mos 09/16/2013, 09/23/2017, 09/17/2018, 08/16/2019, 08/24/2022   PNEUMOCOCCAL CONJUGATE-20 01/09/2024   Td 11/12/2008   Tdap 03/19/2018   Zoster Recombinant(Shingrix ) 05/16/2019, 08/06/2019   Health Maintenance Due  Topic Date Due   HIV Screening  Never done   Hepatitis C Screening  Never done   DEXA  SCAN  05/04/2024   In need of new gyn    Colonoscopy was 07/2021 with 5 y recall Had EGD also / had stricture and HH   (may need surgery in the future)     Activities of Daily Living    05/27/2024   10:10 AM  In your present state of health, do you have any difficulty performing the following activities:  Hearing? 0  Vision? 0  Difficulty concentrating or making decisions? 0  Walking or climbing stairs? 0  Dressing or bathing? 0  Doing errands, shopping? 0  Preparing Food and eating ? N  Using the Toilet? N  In the past six months, have you accidently leaked urine? N  Do you have problems with loss of bowel control? N  Managing your Medications? N  Managing your Finances? N  Housekeeping or managing your Housekeeping? N    Physical Exam   Physical Exam Constitutional:      General: She is not in acute distress.    Appearance: Normal appearance. She is well-developed. She is obese. She is not ill-appearing or diaphoretic.  HENT:     Head: Normocephalic and atraumatic.     Right Ear: Tympanic membrane, ear canal and external ear normal.     Left Ear: Tympanic membrane, ear canal and external ear normal.     Nose: Nose normal. No congestion.     Mouth/Throat:     Mouth: Mucous membranes are moist.     Pharynx: Oropharynx is clear. No posterior oropharyngeal erythema.   Eyes:     General: No scleral icterus.    Extraocular Movements: Extraocular movements intact.     Conjunctiva/sclera: Conjunctivae normal.     Pupils: Pupils are equal, round, and reactive to light.   Neck:     Thyroid : No thyromegaly.     Vascular: No carotid bruit or JVD.   Cardiovascular:     Rate and Rhythm: Normal rate and regular rhythm.     Pulses: Normal pulses.     Heart sounds: Normal heart sounds.     No gallop.  Pulmonary:     Effort: Pulmonary effort is normal. No respiratory distress.     Breath sounds: Normal breath sounds. No wheezing.     Comments: Good air exch Chest:      Chest wall: No tenderness.  Abdominal:     General: Bowel sounds are normal. There is no distension or abdominal bruit.     Palpations: Abdomen is soft. There is no mass.     Tenderness: There is no abdominal tenderness.     Hernia: No hernia is present.  Genitourinary:    Comments: Breast and pelvic exam are done by gyn provider    Musculoskeletal:  General: No tenderness. Normal range of motion.     Cervical back: Normal range of motion and neck supple. No rigidity. No muscular tenderness.     Right lower leg: No edema.     Left lower leg: No edema.     Comments: No kyphosis   Lymphadenopathy:     Cervical: No cervical adenopathy.   Skin:    General: Skin is warm and dry.     Coloration: Skin is not pale.     Findings: No erythema or rash.   Neurological:     Mental Status: She is alert. Mental status is at baseline.     Cranial Nerves: No cranial nerve deficit.     Motor: No abnormal muscle tone.     Coordination: Coordination normal.     Gait: Gait normal.     Deep Tendon Reflexes: Reflexes are normal and symmetric. Reflexes normal.   Psychiatric:        Mood and Affect: Mood normal.        Cognition and Memory: Cognition and memory normal.    (optional), or other factors deemed appropriate based on the beneficiary's medical and social history and current clinical standards.   Advanced Directives: Does Patient Have a Medical Advance Directive?: Yes        Assessment:    This is a routine wellness examination for this patient .   Vision/Hearing screen Hearing Screening   500Hz  1000Hz  2000Hz  4000Hz   Right ear 40 0 40 40  Left ear 40 0 40 40   Vision Screening   Right eye Left eye Both eyes  Without correction 20/15 20/20 20/15   With correction        Goals      DIET - EAT MORE FRUITS AND VEGETABLES        Depression Screen    05/27/2024   11:31 AM 05/25/2023   11:10 AM 05/23/2022   10:23 AM 05/19/2021   10:15 AM  PHQ 2/9 Scores  PHQ - 2  Score 0 0 0 0  PHQ- 9 Score 2 0  0     Fall Risk    05/27/2024   10:16 AM  Fall Risk   Falls in the past year? 0  Number falls in past yr: 0  Injury with Fall? 0  Risk for fall due to : No Fall Risks  Follow up Falls evaluation completed    Cognitive Function:        05/27/2024   10:17 AM  6CIT Screen  What Year? 0 points  What month? 0 points  What time? 0 points  Count back from 20 0 points  Months in reverse 0 points  Repeat phrase 0 points  Total Score 0 points    Patient Care Team: Tower, Laine LABOR, MD as PCP - General Lavoie, Marie-Lyne, MD as Consulting Physician (Obstetrics and Gynecology)     Chronic medical problems   Hypothyroidism  Pt has no clinical changes No change in energy level/ hair or skin/ edema and no tremor Lab Results  Component Value Date   TSH 4.07 05/20/2024    Levothyroxine  25 mcg daily   Dexa 2022 from gyn  Falls-none  Fractures-none  Supplements  Exercise -walking on treadmill Has weights- needs to get started     GERD Chronic pantoprazole  40 mg bid Cannot come off of it  Lab Results  Component Value Date   VITAMINB12 467 05/20/2024    Hyperlipidemia Lab Results  Component Value Date  CHOL 144 05/20/2024   CHOL 153 05/18/2023   CHOL 169 05/13/2022   Lab Results  Component Value Date   HDL 40 (L) 05/20/2024   HDL 34.10 (L) 05/18/2023   HDL 37.30 (L) 05/13/2022   Lab Results  Component Value Date   LDLCALC 78 05/20/2024   LDLCALC 92 05/18/2023   LDLCALC 97 05/13/2022   Lab Results  Component Value Date   TRIG 165 (H) 05/20/2024   TRIG 134.0 05/18/2023   TRIG 175.0 (H) 05/13/2022   Lab Results  Component Value Date   CHOLHDL 3.6 05/20/2024   CHOLHDL 4 05/18/2023   CHOLHDL 5 05/13/2022   Lab Results  Component Value Date   LDLDIRECT 172.4 12/24/2010   LDLDIRECT 174.9 09/17/2010   LDLDIRECT 164.2 07/06/2010   Atorvastatin  10 mg daily  Well controlled  Diet is good  Avoids fatty foods    Lab Results  Component Value Date   NA 141 05/20/2024   K 4.3 05/20/2024   CO2 23 05/20/2024   GLUCOSE 86 05/20/2024   BUN 12 05/20/2024   CREATININE 0.73 05/20/2024   CALCIUM  9.7 05/20/2024   GFR 85.50 05/18/2023   EGFR 91 05/20/2024   GFRNONAA >90 02/13/2013   Lab Results  Component Value Date   ALT 12 05/20/2024   AST 13 05/20/2024   ALKPHOS 88 05/18/2023   BILITOT 0.4 05/20/2024   Lab Results  Component Value Date   WBC 8.6 05/20/2024   HGB 12.5 05/20/2024   HCT 39.2 05/20/2024   MCV 83.4 05/20/2024   PLT 309 05/20/2024       Plan:    I have personally reviewed and noted the following in the patient's chart:   Medical and social history Use of alcohol, tobacco or illicit drugs  Current medications and supplements including opioid prescriptions.  Functional ability and status Nutritional status Physical activity Advanced directives List of other physicians Hospitalizations, surgeries, and ER visits in previous 12 months Vitals Screenings to include cognitive, depression, and falls Referrals and appointments  In addition, I have reviewed and discussed with patient certain preventive protocols, quality metrics, and best practice recommendations. A written personalized care plan for preventive services as well as general preventive health recommendations were provided to patient.    Problem List Items Addressed This Visit       Digestive   GERD with stricture   Continues pantoprazole  40 mg bid Fair control  HH is adding to this  Considering surgery      Relevant Medications   pantoprazole  (PROTONIX ) 40 MG tablet     Endocrine   Hypothyroid   Hypothyroidism  Pt has no clinical changes No change in energy level/ hair or skin/ edema and no tremor Lab Results  Component Value Date   TSH 4.07 05/20/2024    Plan to continue levothyroxine  25 mcg daily        Musculoskeletal and Integument   Osteopenia   Dexa ordered No falls or  fracture Discussed fall prevention, supplements and exercise for bone density          Other   Welcome to Medicare preventive visit - Primary   Reviewed health habits including diet and exercise and skin cancer prevention Reviewed appropriate screening tests for age  Also reviewed health mt list, fam hx and immunization status , as well as social and family history   See HPI Labs reviewed and ordered Health Maintenance  Topic Date Due   HIV Screening  Never done  Hepatitis C Screening  Never done   DEXA scan (bone density measurement)  05/04/2024   COVID-19 Vaccine (1) 06/05/2027*   Flu Shot  07/05/2024   Mammogram  03/18/2025   Medicare Annual Wellness Visit  05/27/2025   Colon Cancer Screening  07/19/2026   DTaP/Tdap/Td vaccine (3 - Td or Tdap) 03/19/2028   Pneumococcal Vaccine for age over 34  Completed   Zoster (Shingles) Vaccine  Completed   HPV Vaccine  Aged Out   Meningitis B Vaccine  Aged Out  *Topic was postponed. The date shown is not the original due date.    No help needed with ADLS Dexa ordered  Gyn ref done  Discussed fall prevention, supplements and exercise for bone density  Advised directive utd Hearing/vision screen is reassuring        Obesity   Discussed how this problem influences overall health and the risks it imposes  Reviewed plan for weight loss with lower calorie diet (via better food choices (lower glycemic and portion control) along with exercise building up to or more than 30 minutes 5 days per week including some aerobic activity and strength training         Hyperlipidemia   Disc goals for lipids and reasons to control them Rev last labs with pt Rev low sat fat diet in detail LDL down to 78 with atorvastatin  10 mg dand good diet       Hx of adenomatous polyp of colon   Colonoscopy is up to date       History of endometrial cancer   Pt wants to get est with a new gyn  Referral done   Is out of oncology care Her last gyn  provider left practice       Relevant Orders   Ambulatory referral to Gynecology   Estrogen deficiency   Dexa ordered       Relevant Orders   DG Bone Density   Current use of proton pump inhibitor   Continues pantoprazole  40 mg bid  Lab Results  Component Value Date   VITAMINB12 467 05/20/2024         Other Visit Diagnoses       Encounter for Medicare annual wellness exam            Laine Balls, MD 05/27/2024

## 2024-05-27 NOTE — Assessment & Plan Note (Signed)
 Colonoscopy is up to date.

## 2024-05-27 NOTE — Patient Instructions (Addendum)
 I put the referral in for gynecology  Please let us  know if you don't hear in 1-2 weeks to set that up   Stay active  Walking is great  Add some strength training to your routine, this is important for bone and brain health and can reduce your risk of falls and help your body use insulin properly and regulate weight  Light weights, exercise bands , and internet videos are a good way to start  Yoga (chair or regular), machines , floor exercises or a gym with machines are also good options     You have an order for:  []   3D Mammogram  [x]   Bone Density     Please call for appointment:   [x]   [facility]  [address] [place] [address]  [phone]  []   [facility])   [address]  [place], [place] [address]  [phone]  []   [facility]      [address] [place], [place]        [phone]         []   [facility]  [address] [place], [place]  [phone]  []  [facility]   [address]   [place], [place] [address]  [phone]  []  [facility]  [address] [place], [place] [address] [phone]    Make sure to wear two piece clothing  No lotions powders or deodorants the day of the appointment Make sure to bring picture ID and insurance card.  Bring list of medications you are currently taking including any supplements.   Schedule your screening mammogram through MyChart!   Select [facility] imaging sites can now be scheduled through MyChart.  Log into your MyChart account.  Go to 'Visit' (or 'Appointments' if  on mobile App) --> Schedule an  Appointment  Under 'Select a Reason for Visit' choose the Mammogram  Screening option.  Complete the pre-visit questions  and select the time and  place that  best fits your schedule

## 2024-05-27 NOTE — Assessment & Plan Note (Signed)
 Continues pantoprazole  40 mg bid  Lab Results  Component Value Date   VITAMINB12 467 05/20/2024

## 2024-05-27 NOTE — Assessment & Plan Note (Signed)
 Discussed how this problem influences overall health and the risks it imposes  Reviewed plan for weight loss with lower calorie diet (via better food choices (lower glycemic and portion control) along with exercise building up to or more than 30 minutes 5 days per week including some aerobic activity and strength training

## 2024-05-27 NOTE — Assessment & Plan Note (Addendum)
 Reviewed health habits including diet and exercise and skin cancer prevention Reviewed appropriate screening tests for age  Also reviewed health mt list, fam hx and immunization status , as well as social and family history   See HPI Labs reviewed and ordered Health Maintenance  Topic Date Due   HIV Screening  Never done   Hepatitis C Screening  Never done   DEXA scan (bone density measurement)  05/04/2024   COVID-19 Vaccine (1) 06/05/2027*   Flu Shot  07/05/2024   Mammogram  03/18/2025   Medicare Annual Wellness Visit  05/27/2025   Colon Cancer Screening  07/19/2026   DTaP/Tdap/Td vaccine (3 - Td or Tdap) 03/19/2028   Pneumococcal Vaccine for age over 28  Completed   Zoster (Shingles) Vaccine  Completed   HPV Vaccine  Aged Out   Meningitis B Vaccine  Aged Out  *Topic was postponed. The date shown is not the original due date.    No help needed with ADLS Dexa ordered  Gyn ref done  Discussed fall prevention, supplements and exercise for bone density  Advised directive utd Hearing/vision screen is reassuring  PHQ score of 2  No falls or fracture No cognitive concerns Reviewed care team

## 2024-05-27 NOTE — Assessment & Plan Note (Signed)
 Pt wants to get est with a new gyn  Referral done   Is out of oncology care Her last gyn provider left practice

## 2024-05-27 NOTE — Assessment & Plan Note (Signed)
 Disc goals for lipids and reasons to control them Rev last labs with pt Rev low sat fat diet in detail LDL down to 78 with atorvastatin  10 mg dand good diet

## 2024-05-27 NOTE — Assessment & Plan Note (Signed)
 Continues pantoprazole  40 mg bid Fair control  HH is adding to this  Considering surgery

## 2024-05-27 NOTE — Assessment & Plan Note (Signed)
 Hypothyroidism  Pt has no clinical changes No change in energy level/ hair or skin/ edema and no tremor Lab Results  Component Value Date   TSH 4.07 05/20/2024    Plan to continue levothyroxine  25 mcg daily

## 2024-05-28 ENCOUNTER — Other Ambulatory Visit: Payer: Self-pay | Admitting: Family Medicine

## 2024-05-28 ENCOUNTER — Encounter: Payer: Self-pay | Admitting: Family Medicine

## 2024-05-28 MED ORDER — LEVOTHYROXINE SODIUM 25 MCG PO TABS: 25.0000 ug | ORAL_TABLET | Freq: Every day | ORAL | 2 refills | Status: AC

## 2024-05-28 MED ORDER — ATORVASTATIN CALCIUM 10 MG PO TABS: 10.0000 mg | ORAL_TABLET | Freq: Every day | ORAL | 2 refills | Status: AC

## 2024-05-28 NOTE — Telephone Encounter (Signed)
 Copied from CRM [id number]. Topic: Clinical - Medication Refill >> May 28, 2024 10:40 AM Corin V wrote: Medication: atorvastatin  (LIPITOR) 10 MG tablet levothyroxine  (SYNTHROID ) 25 MCG tablet Request script for 1 year for both prescriptions  Has the patient contacted their pharmacy? Yes (Agent: If no, request that the patient contact the pharmacy for the refill. If patient does not wish to contact the pharmacy document the reason why and proceed with request.) (Agent: If yes, when and what did the pharmacy advise?)  This is the patient's preferred pharmacy:  [facility] [id number] [place], [place] - [address] [place] [address] Phone: [phone] Fax: [phone]  Is this the correct pharmacy for this prescription? Yes If no, delete pharmacy and type the correct one.   Has the prescription been filled recently? No  Is the patient out of the medication? No  Has the patient been seen for an appointment in the last year OR does the patient have an upcoming appointment? Yes  Can we respond through MyChart? No  Agent: Please be advised that Rx refills may take up to 3 business days. We ask that you follow-up with your pharmacy.

## 2024-05-28 NOTE — Telephone Encounter (Signed)
 Duplicate, refill request pending for MD. No further action needed. This encounter was created in error - please disregard.

## 2024-07-01 ENCOUNTER — Ambulatory Visit: Admitting: Obstetrics & Gynecology

## 2024-07-01 ENCOUNTER — Encounter: Payer: Self-pay | Admitting: Obstetrics & Gynecology

## 2024-07-01 VITALS — BP 130/62 | HR 79 | Ht 64.0 in | Wt 184.0 lb

## 2024-07-01 DIAGNOSIS — Z8542 Personal history of malignant neoplasm of other parts of uterus: Secondary | ICD-10-CM | POA: Diagnosis not present

## 2024-07-01 DIAGNOSIS — Z90710 Acquired absence of both cervix and uterus: Secondary | ICD-10-CM

## 2024-07-01 DIAGNOSIS — Z01419 Encounter for gynecological examination (general) (routine) without abnormal findings: Secondary | ICD-10-CM | POA: Diagnosis not present

## 2024-07-01 NOTE — Progress Notes (Signed)
 ANNUAL PREVENTATIVE CARE GYNECOLOGY  ENCOUNTER NOTE  Subjective:       Jane Ruiz is a 63 y.o. married G1P1001 (88 yo daughter, 2 grands)  here for a routine annual gynecologic exam. The patient is sexually active. She uses a lubricant. The patient is not taking hormone replacement therapy. Patient denies post-menopausal vaginal bleeding. The patient wears seatbelts: yes. The patient participates in regular exercise: yes.(Treadmill/walking daily)  Has the patient ever been transfused or tattooed?: no. The patient reports that there is not domestic violence in her life.  Current complaints: 1.  She wants to get established here, previously saw Dr. LaVoie.   Gynecologic History No LMP recorded. Patient has had a hysterectomy.  Last mammogram: 03/2024. Results were: normal Last Colonoscopy: 2023, due again in 5 years Last Dexa Scan: 2022, osteopenia, due this year   Obstetric History OB History  Gravida Para Term Preterm AB Living  1 1 1   1   SAB IAB Ectopic Multiple Live Births          # Outcome Date GA Lbr Len/2nd Weight Sex Type Anes PTL Lv  1 Term             Past Medical History:  Diagnosis Date   COVID-19 03/2020   endometrial ca 02/12/2013   endometrial, positive nodes   External hemorrhoids    GERD (gastroesophageal reflux disease)    Hernia, hiatal    History of radiation therapy 05/27/2013-07/01/2013   pelvis 45 gray in 25 fractions, 63 gray with high dose brachytherapy   Hx of adenomatous polyp of colon 06/13/2018   Hyperlipidemia    borderline high cholesterol   Hyperplastic colon polyp    Internal hemorrhoids    Migraine    Osteoporosis    Skin lesion    non maligant skin lesions on head    Family History  Problem Relation Age of Onset   Diabetes Mother    Kidney disease Mother    Benign prostatic hyperplasia Father    Diabetes Maternal Grandmother    Colon cancer Neg Hx    Esophageal cancer Neg Hx    Stomach cancer Neg Hx    Colon polyps Neg  Hx    Rectal cancer Neg Hx     Past Surgical History:  Procedure Laterality Date   ABDOMINAL HYSTERECTOMY  02/12/2013   COLONOSCOPY     COLONOSCOPY W/ BIOPSIES  06/15/2009   Dr Rosalie - distal hyperplastic polyps   RHINOPLASTY     breathing problems- Dr Floy   ROBOTIC ASSISTED TOTAL HYSTERECTOMY WITH BILATERAL SALPINGO OOPHERECTOMY N/A 02/12/2013   Procedure: ROBOTIC ASSISTED TOTAL HYSTERECTOMY WITH BILATERAL SALPINGO OOPHORECTOMY,  LYMPH NODES;  Surgeon: Elenore A. Dodie, MD;  Location: [facility];  Service: Gynecology;  Laterality: N/A;    Social History   Socioeconomic History   Marital status: Married    Spouse name: Not on file   Number of children: 1   Years of education: Not on file   Highest education level: Not on file  Occupational History   Occupation: Producer, 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[employer]    Comment: Retired  Tobacco Use   Smoking status: Never   Smokeless tobacco: Never  Vaping Use   Vaping status: Never Used  Substance and Sexual Activity   Alcohol use: No   Drug use: No   Sexual activity: Yes    Partners: Male    Birth control/protection: Surgical, None    Comment: 1st intercourse-  14, partners- 1,hysterectomy  Other Topics Concern   Not on file  Social History Narrative   Married one daughter born 1982 2 grandchildren   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   1 cup of caffeine daily      Social Drivers of Health   Financial Resource Strain: Low Risk  (05/27/2024)   Overall Financial Resource Strain (CARDIA)    Difficulty of Paying Living Expenses: Not hard at all  Food Insecurity: No Food Insecurity (05/27/2024)   Hunger Vital Sign    Worried About Running Out of Food in the Last Year: Never true    Ran Out of Food in the Last Year: Never true  Transportation Needs: No Transportation Needs (05/27/2024)   PRAPARE - Administrator, Civil Service (Medical): No    Lack of Transportation (Non-Medical): No  Physical  Activity: Sufficiently Active (05/27/2024)   Exercise Vital Sign    Days of Exercise per Week: 6 days    Minutes of Exercise per Session: 60 min  Stress: No Stress Concern Present (05/27/2024)   [employer] of Occupational Health - Occupational Stress Questionnaire    Feeling of Stress: Not at all  Social Connections: Socially Integrated (05/27/2024)   Social Connection and Isolation Panel    Frequency of Communication with Friends and Family: Three times a week    Frequency of Social Gatherings with Friends and Family: Three times a week    Attends Religious Services: 1 to 4 times per year    Active Member of Clubs or Organizations: No    Attends Banker Meetings: 1 to 4 times per year    Marital Status: Married  Catering manager Violence: Not At Risk (05/27/2024)   Humiliation, Afraid, Rape, and Kick questionnaire    Fear of Current or Ex-Partner: No    Emotionally Abused: No    Physically Abused: No    Sexually Abused: No    Current Outpatient Medications on File Prior to Visit  Medication Sig Dispense Refill   atorvastatin  (LIPITOR) 10 MG tablet Take 1 tablet (10 mg total) by mouth daily. 90 tablet 2   Biotin 2500 MCG CAPS      Cholecalciferol (VITAMIN D3) 50 MCG (2000 UT) capsule      COLLAGEN PO Take 1 tablet by mouth daily.     levothyroxine  (SYNTHROID ) 25 MCG tablet Take 1 tablet (25 mcg total) by mouth daily before breakfast. 90 tablet 2   Multiple Vitamin (MULTIVITAMIN) tablet Take 1 tablet by mouth daily.     Omega-3 Fatty Acids (FISH OIL) 1200 MG CAPS Take 1 capsule by mouth daily.     pantoprazole  (PROTONIX ) 40 MG tablet Take 1 tablet (40 mg total) by mouth 2 (two) times daily before a meal. 30 mins before breakfast and supper 180 tablet 3   No current facility-administered medications on file prior to visit.    No Known Allergies    Review of Systems ROS Review of Systems - General ROS: negative for - chills, fatigue, fever, hot flashes, night  sweats, weight gain or weight loss Psychological ROS: negative for - anxiety, decreased libido, depression, mood swings, physical abuse or sexual abuse Ophthalmic ROS: negative for - blurry vision, eye pain or loss of vision ENT ROS: negative for - headaches, hearing change, visual changes or vocal changes Allergy and Immunology ROS: negative for - hives, itchy/watery eyes or seasonal allergies Hematological and Lymphatic ROS: negative for - bleeding problems, bruising, swollen lymph nodes or  weight loss Endocrine ROS: negative for - galactorrhea, hair pattern changes, hot flashes, malaise/lethargy, mood swings, palpitations, polydipsia/polyuria, skin changes, temperature intolerance or unexpected weight changes Breast ROS: negative for - new or changing breast lumps or nipple discharge Respiratory ROS: negative for - cough or shortness of breath Cardiovascular ROS: negative for - chest pain, irregular heartbeat, palpitations or shortness of breath Gastrointestinal ROS: no abdominal pain, change in bowel habits, or black or bloody stools Genito-Urinary ROS: no dysuria, trouble voiding, or hematuria Musculoskeletal ROS: negative for - joint pain or joint stiffness Neurological ROS: negative for - bowel and bladder control changes Dermatological ROS: negative for rash and skin lesion changes   Objective:   BP 130/62   Pulse 79   Ht 5' 4 (1.626 m)   Wt 184 lb (83.5 kg)   BMI 31.58 kg/m  CONSTITUTIONAL: Well-developed, well-nourished female in no acute distress.  PSYCHIATRIC: Normal mood and affect. Normal behavior. Normal judgment and thought content. NEUROLGIC: Alert and oriented to person, place, and time. Normal muscle tone coordination. No cranial nerve deficit noted. HENT:  Normocephalic, atraumatic, External right and left ear normal. Oropharynx is clear and moist EYES: Conjunctivae and EOM are normal. Pupils are equal, round, and reactive to light. No scleral icterus.  NECK: Normal  range of motion, supple, no masses.  Normal thyroid .  SKIN: Skin is warm and dry. No rash noted. Not diaphoretic. No erythema. No pallor. CARDIOVASCULAR: Normal heart rate noted, regular rhythm, no murmur. RESPIRATORY: Clear to auscultation bilaterally. Effort and breath sounds normal, no problems with respiration noted. BREASTS: Symmetric in size. No masses, skin changes, nipple drainage, or lymphadenopathy. ABDOMEN: Soft, normal bowel sounds, no distention noted.  No tenderness, rebound or guarding.  EG- mild atrophy Vaginal exam with speculum reveals atrophy but no lesions  RV: External Exam NormaI, No Rectal Masses, and Normal Sphincter tone  MUSCULOSKELETAL: Normal range of motion. No tenderness.  No cyanosis, clubbing, or edema.  2+ distal pulses. LYMPHATIC: No Axillary, Supraclavicular, or Inguinal Adenopathy.   Labs: Lab Results  Component Value Date   WBC 8.6 05/20/2024   HGB 12.5 05/20/2024   HCT 39.2 05/20/2024   MCV 83.4 05/20/2024   PLT 309 05/20/2024    Lab Results  Component Value Date   CREATININE 0.73 05/20/2024   BUN 12 05/20/2024   NA 141 05/20/2024   K 4.3 05/20/2024   CL 105 05/20/2024   CO2 23 05/20/2024    Lab Results  Component Value Date   ALT 12 05/20/2024   AST 13 05/20/2024   ALKPHOS 88 05/18/2023   BILITOT 0.4 05/20/2024    Lab Results  Component Value Date   CHOL 144 05/20/2024   HDL 40 (L) 05/20/2024   LDLCALC 78 05/20/2024   LDLDIRECT 172.4 12/24/2010   TRIG 165 (H) 05/20/2024   CHOLHDL 3.6 05/20/2024    Lab Results  Component Value Date   TSH 4.07 05/20/2024    No results found for: HGBA1C   Assessment:   1. Well woman exam with routine gynecological exam   2. History of endometrial cancer      Plan:  Pap: We discussed ACOG rec for no paps after hysterectomy for endometrial cancer. However, I offered to do whatever makes her feel more reassured.  Rec annual gyn visit. Rec SVE monthly Return to Clinic - 1  Year   Harland JAYSON Birkenhead, MD [facility]

## 2024-08-01 ENCOUNTER — Ambulatory Visit
Admission: RE | Admit: 2024-08-01 | Discharge: 2024-08-01 | Disposition: A | Discharge status: Home / Self Care | Source: Ambulatory Visit | Attending: Family Medicine | Admitting: Family Medicine

## 2024-08-01 ENCOUNTER — Ambulatory Visit: Payer: Self-pay | Admitting: Family Medicine

## 2024-08-01 DIAGNOSIS — M85852 Other specified disorders of bone density and structure, left thigh: Secondary | ICD-10-CM | POA: Diagnosis not present

## 2024-08-01 DIAGNOSIS — Z780 Asymptomatic menopausal state: Secondary | ICD-10-CM | POA: Diagnosis not present

## 2024-08-01 DIAGNOSIS — E2839 Other primary ovarian failure: Secondary | ICD-10-CM | POA: Insufficient documentation

## 2024-11-06 DIAGNOSIS — K080 Exfoliation of teeth due to systemic causes: Secondary | ICD-10-CM | POA: Diagnosis not present

## 2025-05-20 ENCOUNTER — Other Ambulatory Visit

## 2025-05-27 ENCOUNTER — Encounter: Admitting: Family Medicine
